# Patient Record
Sex: Male | Born: 1955 | Race: Black or African American | Hispanic: No | Marital: Single | Smoking: Former smoker
Health system: Southern US, Community
[De-identification: ages and names within clinical notes are randomized; demographics above are authoritative.]

## PROBLEM LIST (undated history)

## (undated) DIAGNOSIS — I999 Unspecified disorder of circulatory system: Secondary | ICD-10-CM

## (undated) DIAGNOSIS — E039 Hypothyroidism, unspecified: Secondary | ICD-10-CM

## (undated) DIAGNOSIS — M109 Gout, unspecified: Secondary | ICD-10-CM

## (undated) DIAGNOSIS — J449 Chronic obstructive pulmonary disease, unspecified: Secondary | ICD-10-CM

## (undated) DIAGNOSIS — F1010 Alcohol abuse, uncomplicated: Secondary | ICD-10-CM

## (undated) DIAGNOSIS — E785 Hyperlipidemia, unspecified: Secondary | ICD-10-CM

## (undated) DIAGNOSIS — I10 Essential (primary) hypertension: Secondary | ICD-10-CM

## (undated) DIAGNOSIS — I639 Cerebral infarction, unspecified: Secondary | ICD-10-CM

## (undated) HISTORY — DX: Alcohol abuse, uncomplicated: F10.10

## (undated) HISTORY — DX: Essential (primary) hypertension: I10

## (undated) HISTORY — DX: Hyperlipidemia, unspecified: E78.5

## (undated) HISTORY — DX: Hypothyroidism, unspecified: E03.9

## (undated) HISTORY — DX: Cerebral infarction, unspecified: I63.9

## (undated) HISTORY — DX: Unspecified disorder of circulatory system: I99.9

## (undated) HISTORY — DX: Chronic obstructive pulmonary disease, unspecified: J44.9

## (undated) HISTORY — DX: Gout, unspecified: M10.9

---

## 2008-07-10 ENCOUNTER — Ambulatory Visit: Payer: Self-pay | Admitting: Family Medicine

## 2008-11-22 ENCOUNTER — Emergency Department: Payer: Self-pay | Admitting: Emergency Medicine

## 2009-05-21 ENCOUNTER — Ambulatory Visit: Payer: Self-pay | Admitting: Neurology

## 2009-12-17 ENCOUNTER — Ambulatory Visit: Payer: Self-pay | Admitting: Gastroenterology

## 2009-12-18 LAB — PATHOLOGY REPORT

## 2015-02-04 ENCOUNTER — Other Ambulatory Visit: Payer: Self-pay | Admitting: Student

## 2015-02-04 DIAGNOSIS — R2232 Localized swelling, mass and lump, left upper limb: Secondary | ICD-10-CM

## 2015-02-11 ENCOUNTER — Ambulatory Visit: Admission: RE | Admit: 2015-02-11 | Payer: Medicaid Other | Source: Ambulatory Visit

## 2015-02-26 ENCOUNTER — Ambulatory Visit: Payer: Medicaid Other

## 2015-03-02 ENCOUNTER — Ambulatory Visit
Admission: RE | Admit: 2015-03-02 | Discharge: 2015-03-02 | Disposition: A | Discharge status: Home / Self Care | Payer: Medicare Other | Source: Ambulatory Visit | Attending: Student | Admitting: Student

## 2015-03-02 DIAGNOSIS — R2232 Localized swelling, mass and lump, left upper limb: Secondary | ICD-10-CM | POA: Diagnosis present

## 2015-03-02 MED ORDER — GADOBENATE DIMEGLUMINE 529 MG/ML IV SOLN
20.0000 mL | Freq: Once | INTRAVENOUS | Status: AC | PRN
  Administered 2015-03-02: 16 mL via INTRAVENOUS

## 2016-09-27 ENCOUNTER — Telehealth: Payer: Self-pay | Admitting: *Deleted

## 2016-09-27 DIAGNOSIS — Z87891 Personal history of nicotine dependence: Secondary | ICD-10-CM

## 2016-09-27 NOTE — Telephone Encounter (Signed)
Received referral for initial lung cancer screening scan. Contacted patient's sister and obtained smoking history,(current, 48 pack year) as well as answering questions related to screening process. Patient denies signs of lung cancer such as weight loss or hemoptysis. Patient denies comorbidity that would prevent curative treatment if lung cancer were found. Patient is scheduled for shared decision making visit and CT scan on 10/04/16.

## 2016-10-04 ENCOUNTER — Ambulatory Visit
Admission: RE | Admit: 2016-10-04 | Discharge: 2016-10-04 | Disposition: A | Discharge status: Home / Self Care | Payer: Medicare Other | Source: Ambulatory Visit | Attending: Oncology | Admitting: Oncology

## 2016-10-04 ENCOUNTER — Inpatient Hospital Stay: Payer: Medicare Other | Attending: Oncology | Admitting: Oncology

## 2016-10-04 ENCOUNTER — Encounter: Payer: Self-pay | Admitting: Oncology

## 2016-10-04 DIAGNOSIS — Z87891 Personal history of nicotine dependence: Secondary | ICD-10-CM

## 2016-10-04 DIAGNOSIS — Z720 Tobacco use: Secondary | ICD-10-CM | POA: Insufficient documentation

## 2016-10-04 DIAGNOSIS — F17210 Nicotine dependence, cigarettes, uncomplicated: Secondary | ICD-10-CM | POA: Diagnosis not present

## 2016-10-04 DIAGNOSIS — I2510 Atherosclerotic heart disease of native coronary artery without angina pectoris: Secondary | ICD-10-CM | POA: Insufficient documentation

## 2016-10-04 DIAGNOSIS — Z122 Encounter for screening for malignant neoplasm of respiratory organs: Secondary | ICD-10-CM | POA: Diagnosis not present

## 2016-10-04 DIAGNOSIS — I700 Atherosclerosis of aorta: Secondary | ICD-10-CM | POA: Insufficient documentation

## 2016-10-04 NOTE — Progress Notes (Signed)
In accordance with CMS guidelines, patient has met eligibility criteria including age, absence of signs or symptoms of lung cancer.  Social History  Substance Use Topics  . Smoking status: Current Every Day Smoker    Packs/day: 1.00    Years: 48.00  . Smokeless tobacco: Not on file  . Alcohol use Not on file     A shared decision-making session was conducted prior to the performance of CT scan. This includes one or more decision aids, includes benefits and harms of screening, follow-up diagnostic testing, over-diagnosis, false positive rate, and total radiation exposure.  Counseling on the importance of adherence to annual lung cancer LDCT screening, impact of co-morbidities, and ability or willingness to undergo diagnosis and treatment is imperative for compliance of the program.  Counseling on the importance of continued smoking cessation for former smokers; the importance of smoking cessation for current smokers, and information about tobacco cessation interventions have been given to patient including [place] and [address] quit [place] programs.  Written order for lung cancer screening with LDCT has been given to the patient and any and all questions have been answered to the best of my abilities.   Yearly follow up will be coordinated by Burgess Estelle, Thoracic Navigator.

## 2016-10-06 ENCOUNTER — Encounter: Payer: Self-pay | Admitting: *Deleted

## 2017-10-12 ENCOUNTER — Telehealth: Payer: Self-pay | Admitting: *Deleted

## 2017-10-12 DIAGNOSIS — Z122 Encounter for screening for malignant neoplasm of respiratory organs: Secondary | ICD-10-CM

## 2017-10-12 DIAGNOSIS — Z87891 Personal history of nicotine dependence: Secondary | ICD-10-CM

## 2017-10-12 NOTE — Telephone Encounter (Signed)
Notified patient sister that annual lung cancer screening low dose CT scan is due currently or will be in near future. Confirmed that patient is within the age range of 55-77, and asymptomatic, (no signs or symptoms of lung cancer). Patients sister denies illness that would prevent curative treatment for lung cancer if found. Verified smoking history, (current, 49 pack year). The shared decision making visit was done 10/04/16. Patient is agreeable for CT scan being scheduled.

## 2017-10-23 ENCOUNTER — Ambulatory Visit: Admission: RE | Admit: 2017-10-23 | Payer: Medicare Other | Source: Ambulatory Visit

## 2017-11-07 ENCOUNTER — Telehealth: Payer: Self-pay | Admitting: *Deleted

## 2017-11-07 NOTE — Telephone Encounter (Signed)
Left message for patient to notify them that it is time to schedule annual low dose lung cancer screening CT scan. Instructed patient to call back to verify information prior to the scan being scheduled.  

## 2017-11-08 ENCOUNTER — Telehealth: Payer: Self-pay | Admitting: *Deleted

## 2017-11-08 NOTE — Telephone Encounter (Signed)
Spoke with sister regarding rescheduling lung screening scan. Please see prior documentation regarding elibility requirements.

## 2017-11-14 ENCOUNTER — Ambulatory Visit
Admission: RE | Admit: 2017-11-14 | Discharge: 2017-11-14 | Disposition: A | Discharge status: Home / Self Care | Payer: Medicare Other | Source: Ambulatory Visit | Attending: Oncology | Admitting: Oncology

## 2017-11-14 DIAGNOSIS — J439 Emphysema, unspecified: Secondary | ICD-10-CM | POA: Diagnosis not present

## 2017-11-14 DIAGNOSIS — I2510 Atherosclerotic heart disease of native coronary artery without angina pectoris: Secondary | ICD-10-CM | POA: Insufficient documentation

## 2017-11-14 DIAGNOSIS — Z87891 Personal history of nicotine dependence: Secondary | ICD-10-CM | POA: Diagnosis not present

## 2017-11-14 DIAGNOSIS — R918 Other nonspecific abnormal finding of lung field: Secondary | ICD-10-CM | POA: Insufficient documentation

## 2017-11-14 DIAGNOSIS — I700 Atherosclerosis of aorta: Secondary | ICD-10-CM | POA: Insufficient documentation

## 2017-11-14 DIAGNOSIS — Z122 Encounter for screening for malignant neoplasm of respiratory organs: Secondary | ICD-10-CM | POA: Insufficient documentation

## 2017-11-20 ENCOUNTER — Telehealth: Payer: Self-pay | Admitting: *Deleted

## 2017-11-20 NOTE — Telephone Encounter (Signed)
Notified patient of LDCT lung cancer screening program results with recommendation for 6 month follow up imaging. Also notified of incidental findings noted below and is encouraged to discuss further with PCP who will receive a copy of this note and/or the CT report. Patient verbalizes understanding.   IMPRESSION: 1. Lung-RADS 3, probably benign findings. Short-term follow-up in 6 months is recommended with repeat low-dose chest CT without contrast (please use the following order, "CT CHEST LCS NODULE FOLLOW-UP W/O CM"). Biapical pleuroparenchymal scarring, with a right apical density which may have increased/enlarged compared to the 10/04/2016 exam. Favor progressive scarring. A developing neoplasm cannot be excluded. 2. Aortic atherosclerosis (ICD10-I70.0), coronary artery atherosclerosis and emphysema (ICD10-J43.9).

## 2018-04-28 ENCOUNTER — Telehealth: Payer: Self-pay

## 2018-04-28 NOTE — Telephone Encounter (Signed)
Call pt sister regarding lung screening. Left message for a call return.

## 2018-05-04 ENCOUNTER — Telehealth: Payer: Self-pay | Admitting: *Deleted

## 2018-05-04 NOTE — Telephone Encounter (Signed)
Voicemail left at sister's number as previously instructed in attempt to schedule LCS nodule follow up scan.

## 2018-05-08 ENCOUNTER — Telehealth: Payer: Self-pay | Admitting: *Deleted

## 2018-05-08 ENCOUNTER — Encounter: Payer: Self-pay | Admitting: *Deleted

## 2018-05-08 DIAGNOSIS — Z122 Encounter for screening for malignant neoplasm of respiratory organs: Secondary | ICD-10-CM

## 2018-05-08 NOTE — Telephone Encounter (Signed)
Patient has been notified that the annual lung cancer screening low dose CT scan is due currently or will be in the near future.  Confirmed that the patient is within the age range of 655-80, and asymptomatic, and currently exhibits no signs or symptoms of lung cancer.  Patient denies illness that would prevent curative treatment for lung cancer if found.  Verified smoking history, current smoker 1 ppd with 50 pky history.  The shared decision making visit was completed on 10-04-16.  Patient is agreeable for the CT scan to be scheduled.  Will call patient back with date and time of appointment.

## 2018-05-08 NOTE — Telephone Encounter (Signed)
Called sister Claris CheMargaret r/t LDCT screening appt for Tuesday 05/22/2018 @ 2:00pm here @ [facility], voiced understanding. appt mailed.

## 2018-05-22 ENCOUNTER — Ambulatory Visit: Payer: Medicare Other

## 2018-06-01 ENCOUNTER — Ambulatory Visit
Admission: RE | Admit: 2018-06-01 | Discharge: 2018-06-01 | Disposition: A | Discharge status: Home / Self Care | Payer: Medicare Other | Source: Ambulatory Visit | Attending: Nurse Practitioner | Admitting: Nurse Practitioner

## 2018-06-01 DIAGNOSIS — Z122 Encounter for screening for malignant neoplasm of respiratory organs: Secondary | ICD-10-CM | POA: Diagnosis not present

## 2018-06-04 ENCOUNTER — Encounter: Payer: Self-pay | Admitting: *Deleted

## 2018-06-18 DIAGNOSIS — Z79899 Other long term (current) drug therapy: Secondary | ICD-10-CM | POA: Insufficient documentation

## 2018-06-18 DIAGNOSIS — M79671 Pain in right foot: Secondary | ICD-10-CM | POA: Insufficient documentation

## 2018-06-18 DIAGNOSIS — G8929 Other chronic pain: Secondary | ICD-10-CM | POA: Insufficient documentation

## 2018-06-18 DIAGNOSIS — Z8739 Personal history of other diseases of the musculoskeletal system and connective tissue: Secondary | ICD-10-CM | POA: Insufficient documentation

## 2018-07-09 DIAGNOSIS — M19071 Primary osteoarthritis, right ankle and foot: Secondary | ICD-10-CM | POA: Insufficient documentation

## 2019-06-05 ENCOUNTER — Telehealth: Payer: Self-pay

## 2019-06-05 NOTE — Telephone Encounter (Signed)
Left message for patient to notify them that it is time to schedule annual low dose lung cancer screening CT scan. Instructed patient to call back ([phone]) to verify information prior to the scan being scheduled.  

## 2019-06-17 ENCOUNTER — Telehealth: Payer: Self-pay | Admitting: *Deleted

## 2019-06-17 DIAGNOSIS — Z87891 Personal history of nicotine dependence: Secondary | ICD-10-CM

## 2019-06-17 NOTE — Telephone Encounter (Signed)
Spoke with sister, Corey Olson and verified info, Patient has been notified that annual lung cancer screening low dose CT scan is due currently or will be in near future. Confirmed that patient is within the age range of 55-77, and asymptomatic, (no signs or symptoms of lung cancer). Patient denies illness that would prevent curative treatment for lung cancer if found. Verified smoking history, (current, 51 pack year). The shared decision making visit was done 10/04/16. Patient is agreeable for CT scan being scheduled.

## 2019-06-20 ENCOUNTER — Other Ambulatory Visit: Payer: Self-pay

## 2019-06-20 ENCOUNTER — Ambulatory Visit
Admission: RE | Admit: 2019-06-20 | Discharge: 2019-06-20 | Disposition: A | Discharge status: Home / Self Care | Payer: Medicare Other | Source: Ambulatory Visit | Attending: Nurse Practitioner | Admitting: Nurse Practitioner

## 2019-06-20 DIAGNOSIS — Z87891 Personal history of nicotine dependence: Secondary | ICD-10-CM | POA: Diagnosis present

## 2019-06-24 ENCOUNTER — Encounter: Payer: Self-pay | Admitting: *Deleted

## 2020-01-13 ENCOUNTER — Telehealth (INDEPENDENT_AMBULATORY_CARE_PROVIDER_SITE_OTHER): Payer: Self-pay | Admitting: Gastroenterology

## 2020-01-13 ENCOUNTER — Other Ambulatory Visit: Payer: Self-pay

## 2020-01-13 DIAGNOSIS — Z1211 Encounter for screening for malignant neoplasm of colon: Secondary | ICD-10-CM

## 2020-01-13 NOTE — Progress Notes (Signed)
Gastroenterology Pre-Procedure Review  Request Date: Monday 03/09/20 Requesting Physician: Dr. Allegra Lai  PATIENT REVIEW QUESTIONS: The patient responded to the following health history questions as indicated:    1. Are you having any GI issues? no 2. Do you have a personal history of Polyps? yes ( maybe 1 in the past but did not see a colonoscopy report in chart) 3. Do you have a family history of Colon Cancer or Polyps? no 4. Diabetes Mellitus? no 5. Joint replacements in the past 12 months?no 6. Major health problems in the past 3 months?no 7. Any artificial heart valves, MVP, or defibrillator?no    MEDICATIONS & ALLERGIES:    Patient reports the following regarding taking any anticoagulation/antiplatelet therapy:   Plavix, Coumadin, Eliquis, Xarelto, Lovenox, Pradaxa, Brilinta, or Effient? no Aspirin? yes (81 mg daily)  Patient confirms/reports the following medications:  Current Outpatient Medications  Medication Sig Dispense Refill  . albuterol (VENTOLIN HFA) 108 (90 Base) MCG/ACT inhaler Inhale into the lungs.    Marland Kitchen allopurinol (ZYLOPRIM) 300 MG tablet Take by mouth.    Marland Kitchen amLODipine (NORVASC) 10 MG tablet Take 1 tablet by mouth daily.    Marland Kitchen aspirin 81 MG EC tablet Take by mouth.    Marland Kitchen atorvastatin (LIPITOR) 80 MG tablet Take by mouth.    . budesonide-formoterol (SYMBICORT) 160-4.5 MCG/ACT inhaler 1 inhalation once daily.    . colchicine 0.6 MG tablet Take by mouth.    . gabapentin (NEURONTIN) 100 MG capsule Take by mouth.    . metoprolol tartrate (LOPRESSOR) 50 MG tablet Take 1 tablet by mouth daily.     No current facility-administered medications for this visit.    Patient confirms/reports the following allergies:  No Known Allergies  Orders Placed This Encounter  Procedures  . Procedural/ Surgical Case Request: COLONOSCOPY WITH PROPOFOL    Standing Status:   Standing    Number of Occurrences:   1    Order Specific Question:   Pre-op diagnosis    Answer:   screening  colonoscopy    Order Specific Question:   CPT Code    Answer:   53299    AUTHORIZATION INFORMATION Primary Insurance: 1D#: Group #:  Secondary Insurance: 1D#: Group #:  SCHEDULE INFORMATION: Date: 03/09/20 Time: Location:[facility]

## 2020-03-05 ENCOUNTER — Other Ambulatory Visit: Admission: RE | Admit: 2020-03-05 | Payer: Medicare Other | Source: Ambulatory Visit

## 2020-03-09 ENCOUNTER — Telehealth: Payer: Self-pay | Admitting: Gastroenterology

## 2020-03-09 ENCOUNTER — Encounter: Admission: RE | Payer: Self-pay | Source: Home / Self Care

## 2020-03-09 ENCOUNTER — Ambulatory Visit: Admission: RE | Admit: 2020-03-09 | Payer: Medicare Other | Source: Home / Self Care | Admitting: Gastroenterology

## 2020-03-09 ENCOUNTER — Telehealth: Payer: Self-pay

## 2020-03-09 SURGERY — COLONOSCOPY WITH PROPOFOL
Anesthesia: General

## 2020-03-09 NOTE — Telephone Encounter (Signed)
Patient sister Corey Olson calling to resch pt colonoscopy that was sch for today. She was not able to get pt covid test, so she would like to resch everything. Please call pt sister back since pt had a stroke and can not talk. Pt had pre screening call on 8.23.21.

## 2020-03-09 NOTE — Telephone Encounter (Signed)
Called patients sister to reschedule Mr. Romanello procedure. Left voicemail for her to call the office back.

## 2020-06-22 ENCOUNTER — Telehealth: Payer: Self-pay | Admitting: *Deleted

## 2020-06-22 ENCOUNTER — Ambulatory Visit (INDEPENDENT_AMBULATORY_CARE_PROVIDER_SITE_OTHER): Payer: Medicare Other | Admitting: Cardiology

## 2020-06-22 ENCOUNTER — Encounter: Payer: Self-pay | Admitting: Cardiology

## 2020-06-22 ENCOUNTER — Other Ambulatory Visit: Payer: Self-pay

## 2020-06-22 VITALS — BP 124/78 | HR 57 | Ht 67.0 in | Wt 163.2 lb

## 2020-06-22 DIAGNOSIS — E7800 Pure hypercholesterolemia, unspecified: Secondary | ICD-10-CM

## 2020-06-22 DIAGNOSIS — I2510 Atherosclerotic heart disease of native coronary artery without angina pectoris: Secondary | ICD-10-CM

## 2020-06-22 DIAGNOSIS — R0600 Dyspnea, unspecified: Secondary | ICD-10-CM

## 2020-06-22 DIAGNOSIS — R0609 Other forms of dyspnea: Secondary | ICD-10-CM

## 2020-06-22 DIAGNOSIS — F17200 Nicotine dependence, unspecified, uncomplicated: Secondary | ICD-10-CM

## 2020-06-22 DIAGNOSIS — I728 Aneurysm of other specified arteries: Secondary | ICD-10-CM

## 2020-06-22 NOTE — Telephone Encounter (Signed)
Attempted to contact and schedule lung screening scan. Message left for patient to call back to schedule. 

## 2020-06-22 NOTE — Progress Notes (Signed)
Cardiology Office Note:    Date:  06/22/2020   ID:  Corey Olson, DOB Sep 26, 1955, MRN [id number]  PCP:  [name], MD  [facility] Cardiologist:  Debbe Odea, MD  [facility] Electrophysiologist:  None   Referring MD: [name], MD   Chief Complaint  Patient presents with  . Other    CAD/AAA. Meds reviewed verbally with pt.    History of Present Illness:    Corey Olson is a 65 y.o. 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  Patient had a recent chest CT for lung cancer screening.  Aortic atherosclerosis and calcified plaque in the coronary arteries involving the left main, LAD, left circumflex and RCA was noted.  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  Patient denies chest pain, endorses shortness of breath especially with exertion which he attributes to COPD.  He walks with a walker due to right-sided weakness from prior stroke.  He still smokes.  Denies palpitations, edema.    Past Medical History:  Diagnosis Date  . Alcohol use disorder, mild, abuse   . COPD (chronic obstructive pulmonary disease) (HCC)   . Gout   . Hyperlipidemia   . Hypertension   . Hypothyroid   . Peripheral vascular complication   . Stroke [facility])     History reviewed. No pertinent surgical history.  Current Medications: Current Meds  Medication Sig  . albuterol (VENTOLIN HFA) 108 (90 Base) MCG/ACT inhaler Inhale into the lungs.  Marland Kitchen allopurinol (ZYLOPRIM) 300 MG tablet Take 300 mg by mouth daily.  Marland Kitchen amLODipine (NORVASC) 10 MG tablet Take 1 tablet by mouth daily.  Marland Kitchen aspirin 81 MG EC tablet Take by mouth.  Marland Kitchen atorvastatin (LIPITOR) 80 MG tablet Take 80 mg by mouth daily.  . budesonide-formoterol (SYMBICORT) 160-4.5 MCG/ACT inhaler 1 inhalation once daily.  . colchicine 0.6 MG tablet Take 0.6 mg by mouth daily.  Marland Kitchen gabapentin (NEURONTIN) 100 MG capsule Take 100 mg by  mouth at bedtime.  . metoprolol tartrate (LOPRESSOR) 50 MG tablet Take 1 tablet by mouth daily.     Allergies:   Patient has no known allergies.   Social History   Socioeconomic History  . Marital status: Single    Spouse name: Not on file  . Number of children: Not on file  . Years of education: Not on file  . Highest education level: Not on file  Occupational History  . Not on file  Tobacco Use  . Smoking status: Current Every Day Smoker    Packs/day: 1.00    Years: 50.00    Pack years: 50.00  . Smokeless tobacco: Never Used  Substance and Sexual Activity  . Alcohol use: Yes    Comment: social  . Drug use: Never  . Sexual activity: Not on file  Other Topics Concern  . Not on file  Social History Narrative  . Not on file   Social Determinants of Health   Financial Resource Strain: Not on file  Food Insecurity: Not on file  Transportation Needs: Not on file  Physical Activity: Not on file  Stress: Not on file  Social Connections: Not on file     Family History: The patient's family history includes Heart Problems in his father; Heart attack in his father and mother.  ROS:   Please see the history of present illness.     All other systems reviewed and  are negative.  EKGs/Labs/Other Studies Reviewed:    The following studies were reviewed today:   EKG:  EKG is  ordered today.  The ekg ordered today demonstrates sinus bradycardia, first-degree AV block  Recent Labs: No results found for requested labs within last 8760 hours.  Recent Lipid Panel No results found for: CHOL, TRIG, HDL, CHOLHDL, VLDL, LDLCALC, LDLDIRECT   Risk Assessment/Calculations:      Physical Exam:    VS:  BP 124/78 (BP Location: Left Arm, Patient Position: Sitting, Cuff Size: Normal)   Pulse (!) 57   Ht 5\' 7"  (1.702 m)   Wt 163 lb 4 oz (74 kg)   SpO2 98%   BMI 25.57 kg/m     Wt Readings from Last 3 Encounters:  06/22/20 163 lb 4 oz (74 kg)  06/20/19 165 lb (74.8 kg)   11/14/17 165 lb (74.8 kg)     GEN:  Well nourished, well developed in no acute distress HEENT: Normal NECK: No JVD; No carotid bruits LYMPHATICS: No lymphadenopathy CARDIAC: Distant heart sounds, no murmurs RESPIRATORY: Decreased breath sounds, rhonchi bilaterally, wheezing noted bilaterally ABDOMEN: Soft, non-tender, non-distended MUSCULOSKELETAL:  No edema; right arm and leg weakness SKIN: Warm and dry NEUROLOGIC:  Alert and oriented x 3, right-sided weakness PSYCHIATRIC:  Normal affect   ASSESSMENT:    1. Coronary artery disease involving native coronary artery of native heart without angina pectoris   2. Dyspnea on exertion   3. Smoking   4. Pure hypercholesterolemia   5. Celiac artery aneurysm (HCC)    PLAN:    In order of problems listed above:  1. Three-vessel calcification noted on recent chest CT.  Continue aspirin, Lipitor.  Endorses shortness of breath.  Obtain Lexiscan Myoview to evaluate presence of ischemia.  Get echocardiogram to evaluate systolic and diastolic function. 2. Shortness of breath with exertion.  Could be due to COPD, current smoker.  Get echo and Lexiscan Myoview as above. 3. Current smoker, cessation advised. 4. Hyperlipidemia, high intensity statin, Lipitor 80 mg daily.  Tl obtained from PCP at goal of 62. 5. 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  Refer to vascular surgery for monitoring and management.  Follow-up after echo and Myoview.   Shared Decision Making/Informed Consent The risks [chest pain, shortness of breath, cardiac arrhythmias, dizziness, blood pressure fluctuations, myocardial infarction, stroke/transient ischemic attack, nausea, vomiting, allergic reaction, radiation exposure, metallic taste sensation and life-threatening complications (estimated to be 1 in 10,000)], benefits (risk stratification, diagnosing coronary artery disease, treatment guidance) and alternatives of a nuclear stress test were discussed in detail with Mr.  Olson and he agrees to proceed.   Medication Adjustments/Labs and Tests Ordered: Current medicines are reviewed at length with the patient today.  Concerns regarding medicines are outlined above.  Orders Placed This Encounter  Procedures  . NM Myocar Multi W/Spect W/Wall Motion / EF  . Ambulatory referral to Vascular Surgery  . EKG 12-Lead  . ECHOCARDIOGRAM COMPLETE   No orders of the defined types were placed in this encounter.   Patient Instructions  Medication Instructions:   Your physician recommends that you continue on your current medications as directed. Please refer to the Current Medication list given to you today.  *If you need a refill on your cardiac medications before your next appointment, please call your pharmacy*   Lab Work: None Ordered If you have labs (blood work) drawn today and your tests are completely normal, you will receive your results only by: Toni Arthurs MyChart Message (  if you have MyChart) OR . A paper copy in the mail If you have any lab test that is abnormal or we need to change your treatment, we will call you to review the results.   Testing/Procedures:  1.  Your physician has requested that you have an echocardiogram. Echocardiography is a painless test that uses sound waves to create images of your heart. It provides your doctor with information about the size and shape of your heart and how well your heart's chambers and valves are working. This procedure takes approximately one hour. There are no restrictions for this procedure.  2.  [facility] MYOVIEW   Your caregiver has ordered a Stress Test with nuclear imaging. The purpose of this test is to evaluate the blood supply to your heart muscle. This procedure is referred to as a "Non-Invasive Stress Test." This is because other than having an IV started in your vein, nothing is inserted or "invades" your body. Cardiac stress tests are done to find areas of poor blood flow to the heart by determining the  extent of coronary artery disease (CAD). Some patients exercise on a treadmill, which naturally increases the blood flow to your heart, while others who are  unable to walk on a treadmill due to physical limitations have a pharmacologic/chemical stress agent called Lexiscan . This medicine will mimic walking on a treadmill by temporarily increasing your coronary blood flow.      PLEASE REPORT TO [facility] MEDICAL MALL ENTRANCE   THE VOLUNTEERS AT THE FIRST DESK WILL DIRECT YOU WHERE TO GO     *Please note: these test may take anywhere between 2-4 hours to complete       Date of Procedure:_____________________________________   Arrival Time for Procedure:______________________________    PLEASE NOTIFY THE OFFICE AT LEAST 24 HOURS IN ADVANCE IF YOU ARE UNABLE TO KEEP YOUR APPOINTMENT.  [phone]  PLEASE NOTIFY NUCLEAR MEDICINE AT [facility] AT LEAST 24 HOURS IN ADVANCE IF YOU ARE UNABLE TO KEEP YOUR APPOINTMENT. [phone]         How to prepare for your Myoview test:    _XX___:  Hold betablocker(s) night before procedure and morning of procedure:   metoprolol tartrate (LOPRESSOR    1. Do not eat or drink after midnight  2. No caffeine for 24 hours prior to test  3. No smoking 24 hours prior to test.  4. Unless instructed otherwise, Take your medication with a small sips of water.    5.         Ladies, please do not wear dresses. Skirts or pants are appropriate. Please wear a short sleeve shirt.  6. No perfume, cologne or lotion.  7. Wear comfortable walking shoes. No heels!     Follow-Up: At [facility], you and your health needs are our priority.  As part of our continuing mission to provide you with exceptional heart care, we have created designated Provider Care Teams.  These Care Teams include your primary Cardiologist (physician) and Advanced Practice Providers (APPs -  Physician Assistants and Nurse Practitioners) who all work together to provide you with the care you need,  when you need it.  We recommend signing up for the patient portal called "MyChart".  Sign up information is provided on this After Visit Summary.  MyChart is used to connect with patients for Virtual Visits (Telemedicine).  Patients are able to view lab/test results, encounter notes, upcoming appointments, etc.  Non-urgent messages can be sent to your provider as well.  To learn more about what you can do with MyChart, go to [email or web address].    Your next appointment:   Follow up after Myoview and Echo   The format for your next appointment:   In Person  Provider:   Debbe Odea, MD   Other Instructions      Signed, Debbe Odea, MD  06/22/2020 4:40 PM    [facility]

## 2020-06-22 NOTE — Patient Instructions (Signed)
Medication Instructions:   Your physician recommends that you continue on your current medications as directed. Please refer to the Current Medication list given to you today.  *If you need a refill on your cardiac medications before your next appointment, please call your pharmacy*   Lab Work: None Ordered If you have labs (blood work) drawn today and your tests are completely normal, you will receive your results only by: Marland Kitchen MyChart Message (if you have MyChart) OR . A paper copy in the mail If you have any lab test that is abnormal or we need to change your treatment, we will call you to review the results.   Testing/Procedures:  1.  Your physician has requested that you have an echocardiogram. Echocardiography is a painless test that uses sound waves to create images of your heart. It provides your doctor with information about the size and shape of your heart and how well your heart's chambers and valves are working. This procedure takes approximately one hour. There are no restrictions for this procedure.  2.  [facility] MYOVIEW   Your caregiver has ordered a Stress Test with nuclear imaging. The purpose of this test is to evaluate the blood supply to your heart muscle. This procedure is referred to as a "Non-Invasive Stress Test." This is because other than having an IV started in your vein, nothing is inserted or "invades" your body. Cardiac stress tests are done to find areas of poor blood flow to the heart by determining the extent of coronary artery disease (CAD). Some patients exercise on a treadmill, which naturally increases the blood flow to your heart, while others who are  unable to walk on a treadmill due to physical limitations have a pharmacologic/chemical stress agent called Lexiscan . This medicine will mimic walking on a treadmill by temporarily increasing your coronary blood flow.      PLEASE REPORT TO [facility] MEDICAL MALL ENTRANCE   THE VOLUNTEERS AT THE FIRST DESK WILL DIRECT  YOU WHERE TO GO     *Please note: these test may take anywhere between 2-4 hours to complete       Date of Procedure:_____________________________________   Arrival Time for Procedure:______________________________    PLEASE NOTIFY THE OFFICE AT LEAST 24 HOURS IN ADVANCE IF YOU ARE UNABLE TO KEEP YOUR APPOINTMENT.  [phone]  PLEASE NOTIFY NUCLEAR MEDICINE AT [facility] AT LEAST 24 HOURS IN ADVANCE IF YOU ARE UNABLE TO KEEP YOUR APPOINTMENT. [phone]         How to prepare for your Myoview test:    _XX___:  Hold betablocker(s) night before procedure and morning of procedure:   metoprolol tartrate (LOPRESSOR    1. Do not eat or drink after midnight  2. No caffeine for 24 hours prior to test  3. No smoking 24 hours prior to test.  4. Unless instructed otherwise, Take your medication with a small sips of water.    5.         Ladies, please do not wear dresses. Skirts or pants are appropriate. Please wear a short sleeve shirt.  6. No perfume, cologne or lotion.  7. Wear comfortable walking shoes. No heels!     Follow-Up: At [facility], you and your health needs are our priority.  As part of our continuing mission to provide you with exceptional heart care, we have created designated Provider Care Teams.  These Care Teams include your primary Cardiologist (physician) and Advanced Practice Providers (APPs -  Physician Assistants and Nurse Practitioners) who  all work together to provide you with the care you need, when you need it.  We recommend signing up for the patient portal called "MyChart".  Sign up information is provided on this After Visit Summary.  MyChart is used to connect with patients for Virtual Visits (Telemedicine).  Patients are able to view lab/test results, encounter notes, upcoming appointments, etc.  Non-urgent messages can be sent to your provider as well.   To learn more about what you can do with MyChart, go to [email or web address].    Your next  appointment:   Follow up after Myoview and Echo   The format for your next appointment:   In Person  Provider:   Debbe Odea, MD   Other Instructions

## 2020-06-29 ENCOUNTER — Other Ambulatory Visit: Payer: Self-pay

## 2020-06-29 ENCOUNTER — Ambulatory Visit
Admission: RE | Admit: 2020-06-29 | Discharge: 2020-06-29 | Disposition: A | Discharge status: Home / Self Care | Payer: Medicare Other | Source: Ambulatory Visit | Attending: Cardiology | Admitting: Cardiology

## 2020-06-29 DIAGNOSIS — I2510 Atherosclerotic heart disease of native coronary artery without angina pectoris: Secondary | ICD-10-CM | POA: Insufficient documentation

## 2020-06-29 DIAGNOSIS — R0600 Dyspnea, unspecified: Secondary | ICD-10-CM | POA: Insufficient documentation

## 2020-06-29 DIAGNOSIS — R0609 Other forms of dyspnea: Secondary | ICD-10-CM

## 2020-06-29 LAB — NM MYOCAR MULTI W/SPECT W/WALL MOTION / EF
Estimated workload: 1 METS
Exercise duration (min): 0 min
Exercise duration (sec): 0 s
LV dias vol: 102 mL (ref 62–150)
LV sys vol: 49 mL
MPHR: 156 {beats}/min
Peak HR: 80 {beats}/min
Percent HR: 51 %
Rest HR: 50 {beats}/min
SDS: 3
SRS: 6
SSS: 4
TID: 1

## 2020-06-29 MED ORDER — TECHNETIUM TC 99M TETROFOSMIN IV KIT
10.5500 | PACK | Freq: Once | INTRAVENOUS | Status: AC | PRN
  Administered 2020-06-29: 10.55 via INTRAVENOUS

## 2020-06-29 MED ORDER — TECHNETIUM TC 99M TETROFOSMIN IV KIT
29.8100 | PACK | Freq: Once | INTRAVENOUS | Status: AC | PRN
  Administered 2020-06-29: 29.81 via INTRAVENOUS

## 2020-06-29 MED ORDER — REGADENOSON 0.4 MG/5ML IV SOLN
0.4000 mg | Freq: Once | INTRAVENOUS | Status: AC
  Administered 2020-06-29: 0.4 mg via INTRAVENOUS

## 2020-07-02 ENCOUNTER — Telehealth: Payer: Self-pay

## 2020-07-02 NOTE — Telephone Encounter (Signed)
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 Get echocardiogram as scheduled. Keep follow-up appointment. Thank you   Encouraged patient to call back if he has any questions or concerns.

## 2020-07-02 NOTE — Telephone Encounter (Signed)
Attempted to contact patient for scheduling lung screening scan. Left message for patient to call [phone] to schedule CT scan.  

## 2020-07-06 ENCOUNTER — Other Ambulatory Visit: Payer: Self-pay

## 2020-07-06 ENCOUNTER — Encounter (INDEPENDENT_AMBULATORY_CARE_PROVIDER_SITE_OTHER): Payer: Self-pay | Admitting: Vascular Surgery

## 2020-07-06 ENCOUNTER — Ambulatory Visit (INDEPENDENT_AMBULATORY_CARE_PROVIDER_SITE_OTHER): Payer: Medicare Other | Admitting: Vascular Surgery

## 2020-07-06 DIAGNOSIS — I739 Peripheral vascular disease, unspecified: Secondary | ICD-10-CM | POA: Diagnosis not present

## 2020-07-06 DIAGNOSIS — I2510 Atherosclerotic heart disease of native coronary artery without angina pectoris: Secondary | ICD-10-CM | POA: Diagnosis not present

## 2020-07-06 DIAGNOSIS — I728 Aneurysm of other specified arteries: Secondary | ICD-10-CM | POA: Diagnosis not present

## 2020-07-08 ENCOUNTER — Telehealth: Payer: Self-pay | Admitting: *Deleted

## 2020-07-08 NOTE — Telephone Encounter (Signed)
Attempted to contact patient for scheduling lung screening scan. Left message for patient to call [phone] to schedule CT scan.  

## 2020-07-09 ENCOUNTER — Ambulatory Visit (INDEPENDENT_AMBULATORY_CARE_PROVIDER_SITE_OTHER): Payer: Medicare Other

## 2020-07-09 ENCOUNTER — Other Ambulatory Visit: Payer: Self-pay

## 2020-07-09 DIAGNOSIS — R0609 Other forms of dyspnea: Secondary | ICD-10-CM

## 2020-07-09 DIAGNOSIS — I2510 Atherosclerotic heart disease of native coronary artery without angina pectoris: Secondary | ICD-10-CM

## 2020-07-09 DIAGNOSIS — R0600 Dyspnea, unspecified: Secondary | ICD-10-CM

## 2020-07-09 LAB — ECHOCARDIOGRAM COMPLETE
AR max vel: 2.41 cm2
AV Area VTI: 2.74 cm2
AV Area mean vel: 2.49 cm2
AV Mean grad: 4 mmHg
AV Peak grad: 8.2 mmHg
Ao pk vel: 1.43 m/s
Area-P 1/2: 3.85 cm2
Calc EF: 45.6 %
S' Lateral: 3.8 cm
Single Plane A2C EF: 40.7 %
Single Plane A4C EF: 44 %

## 2020-07-13 ENCOUNTER — Encounter: Payer: Self-pay | Admitting: Cardiology

## 2020-07-13 ENCOUNTER — Ambulatory Visit (INDEPENDENT_AMBULATORY_CARE_PROVIDER_SITE_OTHER): Payer: Medicare Other | Admitting: Cardiology

## 2020-07-13 ENCOUNTER — Other Ambulatory Visit: Payer: Self-pay

## 2020-07-13 VITALS — BP 110/86 | HR 62 | Ht 67.0 in | Wt 163.0 lb

## 2020-07-13 DIAGNOSIS — I2510 Atherosclerotic heart disease of native coronary artery without angina pectoris: Secondary | ICD-10-CM | POA: Diagnosis not present

## 2020-07-13 DIAGNOSIS — F17200 Nicotine dependence, unspecified, uncomplicated: Secondary | ICD-10-CM | POA: Diagnosis not present

## 2020-07-13 DIAGNOSIS — E7800 Pure hypercholesterolemia, unspecified: Secondary | ICD-10-CM

## 2020-07-13 NOTE — Patient Instructions (Signed)
Medication Instructions:  Your physician recommends that you continue on your current medications as directed. Please refer to the Current Medication list given to you today.  *If you need a refill on your cardiac medications before your next appointment, please call your pharmacy*  Follow-Up: At [facility], you and your health needs are our priority.  As part of our continuing mission to provide you with exceptional heart care, we have created designated Provider Care Teams.  These Care Teams include your primary Cardiologist (physician) and Advanced Practice Providers (APPs -  Physician Assistants and Nurse Practitioners) who all work together to provide you with the care you need, when you need it.  We recommend signing up for the patient portal called "MyChart".  Sign up information is provided on this After Visit Summary.  MyChart is used to connect with patients for Virtual Visits (Telemedicine).  Patients are able to view lab/test results, encounter notes, upcoming appointments, etc.  Non-urgent messages can be sent to your provider as well.   To learn more about what you can do with MyChart, go to [email or web address].    Your next appointment:   6 month(s)  The format for your next appointment:   In Person  Provider:   You may see Brian Agbor-Etang, MD or one of the following Advanced Practice Providers on your designated Care Team:    Christopher Berge, NP  Ryan Dunn, PA-C  Jacquelyn Visser, PA-C  Cadence Furth, PA-C  Caitlin Walker, NP  

## 2020-07-13 NOTE — Progress Notes (Signed)
Cardiology Office Note:    Date:  07/13/2020   ID:  Corey Olson, DOB 1955-06-10, MRN [id number]  PCP:  [name], MD  [facility] Cardiologist:  Debbe Odea, MD  [facility] Electrophysiologist:  None   Referring MD: [name], MD   Chief Complaint  Patient presents with  . Follow-up    Follow up to review test results. Medications verbally reviewed with patient and compared with list from pharmacy    History of Present Illness:    Corey Olson is a 65 y.o. 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  Previously seen due to coronary artery calcifications from lung cancer chest CT.  Endorses dyspnea on exertion.  Echo and Lexiscan Myoview was ordered to evaluate presence of ischemia in light of patient having shortness of breath.  Patient states doing okay, he still smokes, denies chest pain but endorses shortness of breath.    Past Medical History:  Diagnosis Date  . Alcohol use disorder, mild, abuse   . COPD (chronic obstructive pulmonary disease) (HCC)   . Gout   . Hyperlipidemia   . Hypertension   . Hypothyroid   . Peripheral vascular complication   . Stroke [facility])     History reviewed. No pertinent surgical history.  Current Medications: Current Meds  Medication Sig  . albuterol (VENTOLIN HFA) 108 (90 Base) MCG/ACT inhaler Inhale into the lungs.  Marland Kitchen allopurinol (ZYLOPRIM) 300 MG tablet Take 300 mg by mouth daily.  Marland Kitchen amLODipine (NORVASC) 10 MG tablet Take 1 tablet by mouth daily.  Marland Kitchen aspirin 81 MG EC tablet Take by mouth.  Marland Kitchen atorvastatin (LIPITOR) 80 MG tablet Take 80 mg by mouth daily.  . budesonide-formoterol (SYMBICORT) 160-4.5 MCG/ACT inhaler 1 inhalation once daily.  Marland Kitchen gabapentin (NEURONTIN) 100 MG capsule Take 100 mg by mouth at bedtime.  Marland Kitchen lisinopril (ZESTRIL) 20 MG tablet Take 20 mg by mouth daily.  . metoprolol tartrate  (LOPRESSOR) 50 MG tablet Take 1 tablet by mouth daily.     Allergies:   Patient has no known allergies.   Social History   Socioeconomic History  . Marital status: Single    Spouse name: Not on file  . Number of children: Not on file  . Years of education: Not on file  . Highest education level: Not on file  Occupational History  . Not on file  Tobacco Use  . Smoking status: Current Every Day Smoker    Packs/day: 1.00    Years: 50.00    Pack years: 50.00  . Smokeless tobacco: Never Used  Substance and Sexual Activity  . Alcohol use: Yes    Comment: social  . Drug use: Never  . Sexual activity: Not on file  Other Topics Concern  . Not on file  Social History Narrative  . Not on file   Social Determinants of Health   Financial Resource Strain: Not on file  Food Insecurity: Not on file  Transportation Needs: Not on file  Physical Activity: Not on file  Stress: Not on file  Social Connections: Not on file     Family History: The patient's family history includes Heart Problems in his father; Heart attack in his father and mother.  ROS:   Please see the history of present illness.     All other systems reviewed and are negative.  EKGs/Labs/Other Studies Reviewed:    The following studies were  reviewed today:   EKG:  EKG not  ordered today.    Recent Labs: No results found for requested labs within last 8760 hours.  Recent Lipid Panel No results found for: CHOL, TRIG, HDL, CHOLHDL, VLDL, LDLCALC, LDLDIRECT   Risk Assessment/Calculations:      Physical Exam:    VS:  BP 110/86 (BP Location: Left Arm, Patient Position: Sitting, Cuff Size: Normal)   Pulse 62   Ht 5\' 7"  (1.702 m)   Wt 163 lb (73.9 kg)   SpO2 97%   BMI 25.53 kg/m     Wt Readings from Last 3 Encounters:  07/13/20 163 lb (73.9 kg)  07/06/20 165 lb 6.4 oz (75 kg)  06/22/20 163 lb 4 oz (74 kg)     GEN:  Well nourished, well developed in no acute distress HEENT: Normal NECK: No JVD;  No carotid bruits LYMPHATICS: No lymphadenopathy CARDIAC: Distant heart sounds, no murmurs RESPIRATORY: Decreased breath sounds, rhonchi bilaterally, wheezing noted bilaterally ABDOMEN: Soft, non-tender, non-distended MUSCULOSKELETAL:  No edema; right arm and leg weakness SKIN: Warm and dry NEUROLOGIC:  Alert and oriented x 3, right-sided weakness PSYCHIATRIC:  Normal affect   ASSESSMENT:    1. Coronary artery disease involving native coronary artery of native heart without angina pectoris   2. Smoking   3. Pure hypercholesterolemia    PLAN:    In order of problems listed above:  1. Three-vessel calcification noted on recent chest CT.  Continue aspirin, Lipitor.  Echocardiogram showed preserved EF about 50%, Lexiscan Myoview with no evidence of ischemia. 2. Current smoker, cessation advised. 3. Hyperlipidemia, high intensity statin, Lipitor 80 mg daily.  Last lipid panel showed LDL at goal.  Follow-up in 6 months   Medication Adjustments/Labs and Tests Ordered: Current medicines are reviewed at length with the patient today.  Concerns regarding medicines are outlined above.  No orders of the defined types were placed in this encounter.  No orders of the defined types were placed in this encounter.   Patient Instructions  Medication Instructions:  Your physician recommends that you continue on your current medications as directed. Please refer to the Current Medication list given to you today.  *If you need a refill on your cardiac medications before your next appointment, please call your pharmacy*  Follow-Up: At [facility], you and your health needs are our priority.  As part of our continuing mission to provide you with exceptional heart care, we have created designated Provider Care Teams.  These Care Teams include your primary Cardiologist (physician) and Advanced Practice Providers (APPs -  Physician Assistants and Nurse Practitioners) who all work together to  provide you with the care you need, when you need it.  We recommend signing up for the patient portal called "MyChart".  Sign up information is provided on this After Visit Summary.  MyChart is used to connect with patients for Virtual Visits (Telemedicine).  Patients are able to view lab/test results, encounter notes, upcoming appointments, etc.  Non-urgent messages can be sent to your provider as well.   To learn more about what you can do with MyChart, go to [facility].    Your next appointment:   6 month(s)  The format for your next appointment:   In Person  Provider:   You may see [email or web address], MD or one of the following Advanced Practice Providers on your designated Care Team:    Debbe Odea, NP  Nicolasa Ducking, PA-C  Eula Listen, PA-C  Cadence [place], [place]  Gillian Shields, NP     Signed, Debbe Odea, MD  07/13/2020 4:56 PM    [facility]

## 2020-07-17 ENCOUNTER — Encounter (INDEPENDENT_AMBULATORY_CARE_PROVIDER_SITE_OTHER): Payer: Self-pay | Admitting: Vascular Surgery

## 2020-07-17 DIAGNOSIS — I728 Aneurysm of other specified arteries: Secondary | ICD-10-CM | POA: Insufficient documentation

## 2020-07-17 DIAGNOSIS — I739 Peripheral vascular disease, unspecified: Secondary | ICD-10-CM | POA: Insufficient documentation

## 2020-07-17 NOTE — Progress Notes (Signed)
MRN : [id number]  Corey Olson is a 65 y.o. (15-Mar-1956) male who presents with chief complaint of  Chief Complaint  Patient presents with  . New Patient (Initial Visit)    Ref Abgor-Etang celiac artery aneurysm  .  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  The patient denies abdominal pain or postprandial symptoms.  The patient denies weight loss as well as nausea.  The patient does not substantiate food fear, particular foods do not seem to aggravate or alleviate the symptoms.  The patient denies bloody bowel movements or diarrhea.  The patient has a history of colonoscopy which was not diagnostic.  No history of peptic ulcer disease.   The patient describes claudication symptoms but no rest pain symptoms.  No prior peripheral angiograms or vascular interventions.  The patient denies amaurosis fugax or recent TIA symptoms. There are no recent neurological changes noted. The patient denies history of DVT, PE or superficial thrombophlebitis. The patient denies recent episodes of angina    Current Meds  Medication Sig  . albuterol (VENTOLIN HFA) 108 (90 Base) MCG/ACT inhaler Inhale into the lungs.  Marland Kitchen allopurinol (ZYLOPRIM) 300 MG tablet Take 300 mg by mouth daily.  Marland Kitchen amLODipine (NORVASC) 10 MG tablet Take 1 tablet by mouth daily.  Marland Kitchen aspirin 81 MG EC tablet Take by mouth.  Marland Kitchen atorvastatin (LIPITOR) 80 MG tablet Take 80 mg by mouth daily.  . budesonide-formoterol (SYMBICORT) 160-4.5 MCG/ACT inhaler 1 inhalation once daily.  Marland Kitchen gabapentin (NEURONTIN) 100 MG capsule Take 100 mg by mouth at bedtime.  Marland Kitchen lisinopril (ZESTRIL) 20 MG tablet Take 20 mg by mouth daily.  . metoprolol tartrate (LOPRESSOR) 50 MG tablet Take 1 tablet by mouth daily.  . [DISCONTINUED] colchicine 0.6 MG tablet Take 0.6 mg by mouth daily. (Patient not taking: Reported on 07/13/2020)    Past Medical History:  Diagnosis Date  . Alcohol use disorder, mild,  abuse   . COPD (chronic obstructive pulmonary disease) (HCC)   . Gout   . Hyperlipidemia   . Hypertension   . Hypothyroid   . Peripheral vascular complication   . Stroke [facility])     History reviewed. No pertinent surgical history.  Social History Social History   Tobacco Use  . Smoking status: Current Every Day Smoker    Packs/day: 1.00    Years: 50.00    Pack years: 50.00  . Smokeless tobacco: Never Used  Substance Use Topics  . Alcohol use: Yes    Comment: social  . Drug use: Never    Family History Family History  Problem Relation Age of Onset  . Heart attack Mother   . Heart Problems Father   . Heart attack Father   No family history of bleeding/clotting disorders, porphyria or autoimmune disease   No Known Allergies   REVIEW OF SYSTEMS (Negative unless checked)  Constitutional: [] Weight loss  [] Fever  [] Chills Cardiac: [] Chest pain   [] Chest pressure   [] Palpitations   [] Shortness of breath when laying flat   [] Shortness of breath with exertion. Vascular:  [] Pain in legs with walking   [] Pain in legs at rest  [] History of DVT   [] Phlebitis   [] Swelling in legs   [] Varicose veins   [] Non-healing ulcers Pulmonary:   [] Uses home oxygen   [] Productive cough   [] Hemoptysis   [] Wheeze  [] COPD   [] Asthma Neurologic:  [] Dizziness   [] Seizures   [] History of stroke   []   History of TIA  [] Aphasia   [] Vissual changes   [] Weakness or numbness in arm   [] Weakness or numbness in leg Musculoskeletal:   [] Joint swelling   [] Joint pain   [] Low back pain Hematologic:  [] Easy bruising  [] Easy bleeding   [] Hypercoagulable state   [] Anemic Gastrointestinal:  [] Diarrhea   [] Vomiting  [] Gastroesophageal reflux/heartburn   [] Difficulty swallowing. Genitourinary:  [] Chronic kidney disease   [] Difficult urination  [] Frequent urination   [] Blood in urine Skin:  [] Rashes   [] Ulcers  Psychological:  [] History of anxiety   []  History of major depression.  Physical Examination  Vitals:    07/06/20 1534  BP: 131/71  Pulse: 64  Resp: 16  Weight: 165 lb 6.4 oz (75 kg)  Height: 5\' 7"  (1.702 m)   Body mass index is 25.91 kg/m. Gen: WD/WN, NAD Head: [place]/AT, No temporalis wasting.  Ear/Nose/Throat: Hearing grossly intact, nares w/o erythema or drainage, poor dentition Eyes: PER, EOMI, sclera nonicteric.  Neck: Supple, no masses.  No bruit or JVD.  Pulmonary:  Good air movement, clear to auscultation bilaterally, no use of accessory muscles.  Cardiac: RRR, normal S1, S2, no Murmurs. Vascular:  Vessel Right Left  Radial Palpable Palpable  PT Not Palpable Not Palpable  DP Not Palpable Not Palpable  Gastrointestinal: soft, non-distended. No guarding/no peritoneal signs.  Musculoskeletal: M/S 5/5 throughout.  No deformity or atrophy.  Neurologic: CN 2-12 intact. Pain and light touch intact in extremities.  Symmetrical.  Speech is fluent. Motor exam as listed above. Psychiatric: Judgment intact, Mood & affect appropriate for pt's clinical situation. Dermatologic: No rashes or ulcers noted.  No changes consistent with cellulitis.   CBC No results found for: WBC, HGB, HCT, MCV, PLT  BMET No results found for: NA, K, CL, CO2, GLUCOSE, BUN, CREATININE, CALCIUM, GFRNONAA, GFRAA CrCl cannot be calculated (No successful lab value found.).  COAG No results found for: INR, PROTIME  Radiology NM Myocar Multi W/Spect W/Wall Motion / EF  Result Date: 06/29/2020  No T wave inversion was noted during stress.  Defect 1: There is a medium defect of severe severity present in the basal inferior, basal inferolateral, mid inferior and mid inferolateral location.  Findings consistent with prior myocardial infarction with mild peri-infarct ischemia.  This is an intermediate risk study.  Nuclear stress EF: 41%.  CT attenuation images showed evidence of severe coronary calcifications.    ECHOCARDIOGRAM COMPLETE  Result Date: 07/09/2020    ECHOCARDIOGRAM REPORT   Patient Name:   Corey Olson Date of Exam: 07/09/2020 Medical Rec #:     Height:       67.0 in Accession #:      Weight:       165.4 lb Date of Birth:  09-30-1955     BSA:          1.865 m Patient Age:    64 years     BP:           138/70 mmHg Patient Gender: M            HR:           66 bpm. Exam Location:  [facility] Procedure: 2D Echo, Strain Analysis, Cardiac Doppler and Color Doppler Indications:    R06.02 SOB  History:        Patient has no prior history of Echocardiogram examinations.                 CAD, Stroke and COPD, Signs/Symptoms:Shortness  of Breath; Risk                 Factors:Current Smoker, Hypertension and Dyslipidemia.  Sonographer:    Quentin OreGary Joseph RDMS, RVT, RDCS Referring Phys: [id number] BRIAN AGBOR-ETANG IMPRESSIONS  1. Left ventricular ejection fraction, by estimation, is 45 to 50%. The left ventricle has mildly decreased function. The left ventricle demonstrates global hypokinesis. Left ventricular diastolic parameters are consistent with Grade I diastolic dysfunction (impaired relaxation). The average left ventricular global longitudinal strain is -15.0 %.  2. Right ventricular systolic function is normal. The right ventricular size is normal. Tricuspid regurgitation signal is inadequate for assessing PA pressure.  3. Mild to moderate mitral valve regurgitation.  4. The inferior vena cava is normal in size with greater than 50% respiratory variability, suggesting right atrial pressure of 3 mmHg. FINDINGS  Left Ventricle: Left ventricular ejection fraction, by estimation, is 45 to 50%. The left ventricle has mildly decreased function. The left ventricle demonstrates global hypokinesis. The average left ventricular global longitudinal strain is -15.0 %. The left ventricular internal cavity size was normal in size. There is no left ventricular hypertrophy. Left ventricular diastolic parameters are consistent with Grade I diastolic dysfunction (impaired relaxation). Right Ventricle: The right  ventricular size is normal. No increase in right ventricular wall thickness. Right ventricular systolic function is normal. Tricuspid regurgitation signal is inadequate for assessing PA pressure. Left Atrium: Left atrial size was normal in size. Right Atrium: Right atrial size was normal in size. Pericardium: There is no evidence of pericardial effusion. Mitral Valve: The mitral valve is normal in structure. Mild to moderate mitral valve regurgitation. No evidence of mitral valve stenosis. Tricuspid Valve: The tricuspid valve is normal in structure. Tricuspid valve regurgitation is not demonstrated. No evidence of tricuspid stenosis. Aortic Valve: The aortic valve was not well visualized. Aortic valve regurgitation is not visualized. Mild aortic valve sclerosis is present, with no evidence of aortic valve stenosis. Aortic valve mean gradient measures 4.0 mmHg. Aortic valve peak gradient measures 8.2 mmHg. Aortic valve area, by VTI measures 2.74 cm. Pulmonic Valve: The pulmonic valve was normal in structure. Pulmonic valve regurgitation is not visualized. No evidence of pulmonic stenosis. Aorta: The aortic root is normal in size and structure. Venous: The inferior vena cava is normal in size with greater than 50% respiratory variability, suggesting right atrial pressure of 3 mmHg. IAS/Shunts: No atrial level shunt detected by color flow Doppler.  LEFT VENTRICLE PLAX 2D LVIDd:         4.90 cm     Diastology LVIDs:         3.80 cm     LV e' medial:    6.20 cm/s LV PW:         1.10 cm     LV E/e' medial:  11.5 LV IVS:        1.00 cm     LV e' lateral:   9.79 cm/s LVOT diam:     2.30 cm     LV E/e' lateral: 7.3 LV SV:         77 LV SV Index:   41          2D Longitudinal Strain LVOT Area:     4.15 cm    2D Strain GLS (A2C):   -15.4 %                            2D Strain GLS (A3C):   -  16.5 %                            2D Strain GLS (A4C):   -13.1 % LV Volumes (MOD)           2D Strain GLS Avg:     -15.0 % LV vol d, MOD  A2C: 78.9 ml LV vol d, MOD A4C: 85.9 ml LV vol s, MOD A2C: 46.8 ml LV vol s, MOD A4C: 48.1 ml LV SV MOD A2C:     32.1 ml LV SV MOD A4C:     85.9 ml LV SV MOD BP:      39.3 ml RIGHT VENTRICLE             IVC RV Basal diam:  3.40 cm     IVC diam: 1.70 cm RV S prime:     10.80 cm/s TAPSE (M-mode): 2.3 cm LEFT ATRIUM             Index       RIGHT ATRIUM           Index LA diam:        3.90 cm 2.09 cm/m  RA Area:     13.90 cm LA Vol (A2C):   35.1 ml 18.82 ml/m RA Volume:   31.40 ml  16.83 ml/m LA Vol (A4C):   38.5 ml 20.64 ml/m LA Biplane Vol: 36.8 ml 19.73 ml/m  AORTIC VALVE                   PULMONIC VALVE AV Area (Vmax):    2.41 cm    PV Vmax:       0.76 m/s AV Area (Vmean):   2.49 cm    PV Peak grad:  2.3 mmHg AV Area (VTI):     2.74 cm AV Vmax:           143.00 cm/s AV Vmean:          92.100 cm/s AV VTI:            0.281 m AV Peak Grad:      8.2 mmHg AV Mean Grad:      4.0 mmHg LVOT Vmax:         82.90 cm/s LVOT Vmean:        55.200 cm/s LVOT VTI:          0.185 m LVOT/AV VTI ratio: 0.66  AORTA Ao Root diam: 3.30 cm MITRAL VALVE MV Area (PHT): 3.85 cm    SHUNTS MV Decel Time: 197 msec    Systemic VTI:  0.18 m MV E velocity: 71.10 cm/s  Systemic Diam: 2.30 cm MV A velocity: 92.10 cm/s MV E/A ratio:  0.77 Julien Nordmann MD Electronically signed by Julien Nordmann MD Signature Date/Time: 07/09/2020/6:35:59 PM    Final       Assessment/Plan 1. 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  The patient denies lifestyle limiting changes at this point in time.  Given the lack of symptoms no intervention is warranted at this time.   No further invasive studies, angiography or surgery at this time  The patient should continue walking and begin a more formal exercise program.   The patient should continue antiplatelet therapy and aggressive treatment of the lipid abnormalities  Patient should undergo noninvasive studies as ordered. The patient will follow  up with me after the studies.   - VAS  [place] MESENTERIC; Future  2. PAD (peripheral artery disease) (HCC)  Recommend:  The patient has evidence of atherosclerosis of the lower extremities with claudication.  The patient does not voice lifestyle limiting changes at this point in time.  Noninvasive studies do not suggest clinically significant change.  No invasive studies, angiography or surgery at this time The patient should continue walking and begin a more formal exercise program.  The patient should continue antiplatelet therapy and aggressive treatment of the lipid abnormalities  No changes in the patient's medications at this time  The patient should continue wearing graduated compression socks 10-15 mmHg strength to control the mild edema.   - VAS [place] ABI WITH/WO TBI; Future    [name], MD  07/17/2020 2:58 PM

## 2020-07-29 ENCOUNTER — Other Ambulatory Visit: Payer: Self-pay | Admitting: *Deleted

## 2020-07-29 ENCOUNTER — Other Ambulatory Visit: Payer: Self-pay

## 2020-07-29 ENCOUNTER — Encounter (INDEPENDENT_AMBULATORY_CARE_PROVIDER_SITE_OTHER): Payer: Self-pay | Admitting: Nurse Practitioner

## 2020-07-29 ENCOUNTER — Ambulatory Visit (INDEPENDENT_AMBULATORY_CARE_PROVIDER_SITE_OTHER): Payer: Medicare Other

## 2020-07-29 ENCOUNTER — Ambulatory Visit (INDEPENDENT_AMBULATORY_CARE_PROVIDER_SITE_OTHER): Payer: Medicare Other | Admitting: Nurse Practitioner

## 2020-07-29 ENCOUNTER — Telehealth (INDEPENDENT_AMBULATORY_CARE_PROVIDER_SITE_OTHER): Payer: Self-pay

## 2020-07-29 VITALS — BP 128/80 | HR 58 | Ht 67.0 in | Wt 160.0 lb

## 2020-07-29 DIAGNOSIS — I739 Peripheral vascular disease, unspecified: Secondary | ICD-10-CM

## 2020-07-29 DIAGNOSIS — I728 Aneurysm of other specified arteries: Secondary | ICD-10-CM

## 2020-07-29 DIAGNOSIS — Z122 Encounter for screening for malignant neoplasm of respiratory organs: Secondary | ICD-10-CM

## 2020-07-29 DIAGNOSIS — Z87891 Personal history of nicotine dependence: Secondary | ICD-10-CM | POA: Diagnosis not present

## 2020-07-29 DIAGNOSIS — F17200 Nicotine dependence, unspecified, uncomplicated: Secondary | ICD-10-CM

## 2020-07-29 NOTE — Progress Notes (Signed)
Contacted and scheduled for annual lung screening scan. Patient is a current smoker with a 52 pack year history.

## 2020-07-29 NOTE — H&P (View-Only) (Signed)
Subjective:    Patient ID: Corey Olson, male    DOB: 02-15-1956, 65 y.o.   MRN: [id number] Chief Complaint  Patient presents with  . Follow-up    [place]     Corey Olson is a 65 year old man that returns today for noninvasive studies.  He was previously referred by his cardiologist Dr. 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  The CT scan measures the aneurysm at 1.6 cm.  The patient denies any abdominal pain.  He denies any food fear or postprandial discomfort.  He denies any nausea or vomiting.  The patient does have some significant wheezing however his sister notes that they have an upcoming appointment with his pulmonary doctor today.  Patient denies any significant shortness of breath.  The patient does endorse having significant claudication-like symptoms.  He has difficulty walking more than 50 feet without having severe claudication symptoms.  He denies any wounds or ulcerations.  He denies any rest pain like symptoms.  He denies any fever or chills.  The patient also has some right upper extremity weakness that is noted.  Today the patient has an ABI of 0.94 seen bilaterally.  The TBI 0.62 on the right and 0.77 on the left.  These numbers are likely falsely elevated due to medial calcification.  The patient has dampened monophasic anterior tibial artery waveforms on the right with absent posterior tibial artery waveforms.  The right great toe is severely dampened.  The left lower extremity has an absent anterior tibial artery waveform with severely monophasic posterior tibial artery waveforms.  Patient also has dampened left toe waveforms.  It is noted that the Doppler waveforms suggest some component of arterial occlusive disease.   Review of Systems  Respiratory: Positive for wheezing.   Gastrointestinal: Negative for abdominal pain, nausea and vomiting.  All other systems reviewed and are negative.      Objective:   Physical Exam Vitals  reviewed.  HENT:     Head: Normocephalic.  Cardiovascular:     Rate and Rhythm: Normal rate.     Pulses:          Dorsalis pedis pulses are detected w/ Doppler on the right side.       Posterior tibial pulses are detected w/ Doppler on the right side and 1+ on the left side.  Pulmonary:     Effort: Pulmonary effort is normal.     Breath sounds: Wheezing present.  Neurological:     Mental Status: He is alert and oriented to person, place, and time.     Motor: Weakness present.     Gait: Gait abnormal.  Psychiatric:        Mood and Affect: Mood normal.        Behavior: Behavior normal.        Thought Content: Thought content normal.        Judgment: Judgment normal.     BP 128/80   Pulse (!) 58   Ht 5\' 7"  (1.702 m)   Wt 160 lb (72.6 kg)   BMI 25.06 kg/m   Past Medical History:  Diagnosis Date  . Alcohol use disorder, mild, abuse   . COPD (chronic obstructive pulmonary disease) (HCC)   . Gout   . Hyperlipidemia   . Hypertension   . Hypothyroid   . Peripheral vascular complication   . Stroke [facility])     Social History   Socioeconomic History  . Marital status:  Single    Spouse name: Not on file  . Number of children: Not on file  . Years of education: Not on file  . Highest education level: Not on file  Occupational History  . Not on file  Tobacco Use  . Smoking status: Current Every Day Smoker    Packs/day: 1.00    Years: 50.00    Pack years: 50.00  . Smokeless tobacco: Never Used  Substance and Sexual Activity  . Alcohol use: Yes    Comment: social  . Drug use: Never  . Sexual activity: Not on file  Other Topics Concern  . Not on file  Social History Narrative  . Not on file   Social Determinants of Health   Financial Resource Strain: Not on file  Food Insecurity: Not on file  Transportation Needs: Not on file  Physical Activity: Not on file  Stress: Not on file  Social Connections: Not on file  Intimate Partner Violence: Not on file     History reviewed. No pertinent surgical history.  Family History  Problem Relation Age of Onset  . Heart attack Mother   . Heart Problems Father   . Heart attack Father     No Known Allergies  No flowsheet data found.    CMP  No results found for: NA, K, CL, CO2, GLUCOSE, BUN, CREATININE, CALCIUM, PROT, ALBUMIN, AST, ALT, ALKPHOS, BILITOT, GFRNONAA, GFRAA   No results found.     Assessment & Plan:   1. Celiac artery aneurysm (HCC) Noninvasive studies today did confirm presence of celiac artery aneurysm however it measured at 1.08 cm versus 1.6 suggested on CT scan.  Had a discussion with the patient about visceral artery aneurysms and suggested monitoring.  Typically asymptomatic visceral artery aneurysms under 2.5 cm are at very low risk of rupture.  Currently accepted guidelines indicate treatment of visceral artery aneurysms greater than 2 cm diameter as long as it continues to be asymptomatic.  However the patient becomes symptomatic intervention may be indicated sooner.  We will have the patient return to the office in 6 months for repeat studies to evaluate for possible rapid growth.  Patient will continue with aspirin and statin as prescribed.  2. PAD (peripheral artery disease) (HCC) Recommend:  The patient has experienced increased symptoms and is now describing lifestyle limiting claudication and mild rest pain.   Given the severity of the patient's lower extremity symptoms the patient should undergo angiography and intervention, the right lower extremity should be first and the left second..  Risk and benefits were reviewed the patient.  Indications for the procedure were reviewed.  All questions were answered, the patient agrees to proceed.   The patient should continue walking and begin a more formal exercise program.  The patient should continue antiplatelet therapy and aggressive treatment of the lipid abnormalities  The patient will follow up with me after  the angiogram.   3. Personal history of tobacco use, presenting hazards to health Smoking cessation was discussed, 3-10 minutes spent on this topic specifically    Current Outpatient Medications on File Prior to Visit  Medication Sig Dispense Refill  . albuterol (VENTOLIN HFA) 108 (90 Base) MCG/ACT inhaler Inhale into the lungs.    Marland Kitchen allopurinol (ZYLOPRIM) 300 MG tablet Take 300 mg by mouth daily.    Marland Kitchen amLODipine (NORVASC) 10 MG tablet Take 1 tablet by mouth daily.    Marland Kitchen aspirin 81 MG EC tablet Take by mouth.    Marland Kitchen atorvastatin (LIPITOR)  80 MG tablet Take 80 mg by mouth daily.    . budesonide-formoterol (SYMBICORT) 160-4.5 MCG/ACT inhaler 1 inhalation once daily.    Marland Kitchen gabapentin (NEURONTIN) 100 MG capsule Take 100 mg by mouth at bedtime.    Marland Kitchen lisinopril (ZESTRIL) 20 MG tablet Take 20 mg by mouth daily.    . metoprolol tartrate (LOPRESSOR) 50 MG tablet Take 1 tablet by mouth daily.     No current facility-administered medications on file prior to visit.    There are no Patient Instructions on file for this visit. No follow-ups on file.   Georgiana Spinner, NP

## 2020-07-29 NOTE — Telephone Encounter (Signed)
Spoke with the patient's wife and he is scheduled for a RLE angio on 08/25/20 (9:00 am arrival) and LLE angio 09/01/20 (6:45 am arrival) with Dr. Gilda Crease at [facility]. Covid testing on 08/21/20 between 8-2 and 08/28/20 between 8-2 at the [facility]. Pre-procedure instructions were discussed and will be mailed.

## 2020-07-29 NOTE — Progress Notes (Signed)
Subjective:    Patient ID: Corey Olson, male    DOB: 02-15-1956, 65 y.o.   MRN: [id number] Chief Complaint  Patient presents with  . Follow-up    [place]     Corey Olson is a 65 year old man that returns today for noninvasive studies.  He was previously referred by his cardiologist Dr. 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  The CT scan measures the aneurysm at 1.6 cm.  The patient denies any abdominal pain.  He denies any food fear or postprandial discomfort.  He denies any nausea or vomiting.  The patient does have some significant wheezing however his sister notes that they have an upcoming appointment with his pulmonary doctor today.  Patient denies any significant shortness of breath.  The patient does endorse having significant claudication-like symptoms.  He has difficulty walking more than 50 feet without having severe claudication symptoms.  He denies any wounds or ulcerations.  He denies any rest pain like symptoms.  He denies any fever or chills.  The patient also has some right upper extremity weakness that is noted.  Today the patient has an ABI of 0.94 seen bilaterally.  The TBI 0.62 on the right and 0.77 on the left.  These numbers are likely falsely elevated due to medial calcification.  The patient has dampened monophasic anterior tibial artery waveforms on the right with absent posterior tibial artery waveforms.  The right great toe is severely dampened.  The left lower extremity has an absent anterior tibial artery waveform with severely monophasic posterior tibial artery waveforms.  Patient also has dampened left toe waveforms.  It is noted that the Doppler waveforms suggest some component of arterial occlusive disease.   Review of Systems  Respiratory: Positive for wheezing.   Gastrointestinal: Negative for abdominal pain, nausea and vomiting.  All other systems reviewed and are negative.      Objective:   Physical Exam Vitals  reviewed.  HENT:     Head: Normocephalic.  Cardiovascular:     Rate and Rhythm: Normal rate.     Pulses:          Dorsalis pedis pulses are detected w/ Doppler on the right side.       Posterior tibial pulses are detected w/ Doppler on the right side and 1+ on the left side.  Pulmonary:     Effort: Pulmonary effort is normal.     Breath sounds: Wheezing present.  Neurological:     Mental Status: He is alert and oriented to person, place, and time.     Motor: Weakness present.     Gait: Gait abnormal.  Psychiatric:        Mood and Affect: Mood normal.        Behavior: Behavior normal.        Thought Content: Thought content normal.        Judgment: Judgment normal.     BP 128/80   Pulse (!) 58   Ht 5\' 7"  (1.702 m)   Wt 160 lb (72.6 kg)   BMI 25.06 kg/m   Past Medical History:  Diagnosis Date  . Alcohol use disorder, mild, abuse   . COPD (chronic obstructive pulmonary disease) (HCC)   . Gout   . Hyperlipidemia   . Hypertension   . Hypothyroid   . Peripheral vascular complication   . Stroke [facility])     Social History   Socioeconomic History  . Marital status:  Single    Spouse name: Not on file  . Number of children: Not on file  . Years of education: Not on file  . Highest education level: Not on file  Occupational History  . Not on file  Tobacco Use  . Smoking status: Current Every Day Smoker    Packs/day: 1.00    Years: 50.00    Pack years: 50.00  . Smokeless tobacco: Never Used  Substance and Sexual Activity  . Alcohol use: Yes    Comment: social  . Drug use: Never  . Sexual activity: Not on file  Other Topics Concern  . Not on file  Social History Narrative  . Not on file   Social Determinants of Health   Financial Resource Strain: Not on file  Food Insecurity: Not on file  Transportation Needs: Not on file  Physical Activity: Not on file  Stress: Not on file  Social Connections: Not on file  Intimate Partner Violence: Not on file     History reviewed. No pertinent surgical history.  Family History  Problem Relation Age of Onset  . Heart attack Mother   . Heart Problems Father   . Heart attack Father     No Known Allergies  No flowsheet data found.    CMP  No results found for: NA, K, CL, CO2, GLUCOSE, BUN, CREATININE, CALCIUM, PROT, ALBUMIN, AST, ALT, ALKPHOS, BILITOT, GFRNONAA, GFRAA   No results found.     Assessment & Plan:   1. Celiac artery aneurysm (HCC) Noninvasive studies today did confirm presence of celiac artery aneurysm however it measured at 1.08 cm versus 1.6 suggested on CT scan.  Had a discussion with the patient about visceral artery aneurysms and suggested monitoring.  Typically asymptomatic visceral artery aneurysms under 2.5 cm are at very low risk of rupture.  Currently accepted guidelines indicate treatment of visceral artery aneurysms greater than 2 cm diameter as long as it continues to be asymptomatic.  However the patient becomes symptomatic intervention may be indicated sooner.  We will have the patient return to the office in 6 months for repeat studies to evaluate for possible rapid growth.  Patient will continue with aspirin and statin as prescribed.  2. PAD (peripheral artery disease) (HCC) Recommend:  The patient has experienced increased symptoms and is now describing lifestyle limiting claudication and mild rest pain.   Given the severity of the patient's lower extremity symptoms the patient should undergo angiography and intervention, the right lower extremity should be first and the left second..  Risk and benefits were reviewed the patient.  Indications for the procedure were reviewed.  All questions were answered, the patient agrees to proceed.   The patient should continue walking and begin a more formal exercise program.  The patient should continue antiplatelet therapy and aggressive treatment of the lipid abnormalities  The patient will follow up with me after  the angiogram.   3. Personal history of tobacco use, presenting hazards to health Smoking cessation was discussed, 3-10 minutes spent on this topic specifically    Current Outpatient Medications on File Prior to Visit  Medication Sig Dispense Refill  . albuterol (VENTOLIN HFA) 108 (90 Base) MCG/ACT inhaler Inhale into the lungs.    Marland Kitchen allopurinol (ZYLOPRIM) 300 MG tablet Take 300 mg by mouth daily.    Marland Kitchen amLODipine (NORVASC) 10 MG tablet Take 1 tablet by mouth daily.    Marland Kitchen aspirin 81 MG EC tablet Take by mouth.    Marland Kitchen atorvastatin (LIPITOR)  80 MG tablet Take 80 mg by mouth daily.    . budesonide-formoterol (SYMBICORT) 160-4.5 MCG/ACT inhaler 1 inhalation once daily.    . gabapentin (NEURONTIN) 100 MG capsule Take 100 mg by mouth at bedtime.    . lisinopril (ZESTRIL) 20 MG tablet Take 20 mg by mouth daily.    . metoprolol tartrate (LOPRESSOR) 50 MG tablet Take 1 tablet by mouth daily.     No current facility-administered medications on file prior to visit.    There are no Patient Instructions on file for this visit. No follow-ups on file.   Tyrah Broers E Yolunda Kloos, NP   

## 2020-08-05 ENCOUNTER — Other Ambulatory Visit: Payer: Self-pay

## 2020-08-05 ENCOUNTER — Ambulatory Visit
Admission: RE | Admit: 2020-08-05 | Discharge: 2020-08-05 | Disposition: A | Discharge status: Home / Self Care | Payer: Medicare Other | Source: Ambulatory Visit | Attending: Oncology | Admitting: Oncology

## 2020-08-05 DIAGNOSIS — F17200 Nicotine dependence, unspecified, uncomplicated: Secondary | ICD-10-CM | POA: Insufficient documentation

## 2020-08-05 DIAGNOSIS — Z87891 Personal history of nicotine dependence: Secondary | ICD-10-CM | POA: Diagnosis not present

## 2020-08-05 DIAGNOSIS — Z122 Encounter for screening for malignant neoplasm of respiratory organs: Secondary | ICD-10-CM | POA: Diagnosis present

## 2020-08-13 ENCOUNTER — Encounter: Payer: Self-pay | Admitting: *Deleted

## 2020-08-13 NOTE — Progress Notes (Signed)
note

## 2020-08-21 ENCOUNTER — Other Ambulatory Visit
Admission: RE | Admit: 2020-08-21 | Discharge: 2020-08-21 | Disposition: A | Discharge status: Home / Self Care | Payer: Medicare Other | Source: Ambulatory Visit | Attending: Vascular Surgery | Admitting: Vascular Surgery

## 2020-08-21 ENCOUNTER — Other Ambulatory Visit: Payer: Self-pay

## 2020-08-21 DIAGNOSIS — Z20822 Contact with and (suspected) exposure to covid-19: Secondary | ICD-10-CM | POA: Diagnosis not present

## 2020-08-21 DIAGNOSIS — Z01812 Encounter for preprocedural laboratory examination: Secondary | ICD-10-CM | POA: Diagnosis present

## 2020-08-22 LAB — SARS CORONAVIRUS 2 (TAT 6-24 HRS): SARS Coronavirus 2: NEGATIVE

## 2020-08-25 ENCOUNTER — Ambulatory Visit
Admission: RE | Admit: 2020-08-25 | Discharge: 2020-08-25 | Disposition: A | Discharge status: Home / Self Care | Payer: Medicare Other | Attending: Vascular Surgery | Admitting: Vascular Surgery

## 2020-08-25 ENCOUNTER — Other Ambulatory Visit (INDEPENDENT_AMBULATORY_CARE_PROVIDER_SITE_OTHER): Payer: Self-pay | Admitting: Nurse Practitioner

## 2020-08-25 ENCOUNTER — Encounter
Admission: RE | Disposition: A | Discharge status: Home / Self Care | Payer: Self-pay | Source: Home / Self Care | Attending: Vascular Surgery

## 2020-08-25 ENCOUNTER — Other Ambulatory Visit: Payer: Self-pay

## 2020-08-25 DIAGNOSIS — E785 Hyperlipidemia, unspecified: Secondary | ICD-10-CM | POA: Diagnosis not present

## 2020-08-25 DIAGNOSIS — Z7982 Long term (current) use of aspirin: Secondary | ICD-10-CM | POA: Insufficient documentation

## 2020-08-25 DIAGNOSIS — I70219 Atherosclerosis of native arteries of extremities with intermittent claudication, unspecified extremity: Secondary | ICD-10-CM

## 2020-08-25 DIAGNOSIS — Z8673 Personal history of transient ischemic attack (TIA), and cerebral infarction without residual deficits: Secondary | ICD-10-CM | POA: Insufficient documentation

## 2020-08-25 DIAGNOSIS — Z7951 Long term (current) use of inhaled steroids: Secondary | ICD-10-CM | POA: Diagnosis not present

## 2020-08-25 DIAGNOSIS — Z8249 Family history of ischemic heart disease and other diseases of the circulatory system: Secondary | ICD-10-CM | POA: Diagnosis not present

## 2020-08-25 DIAGNOSIS — I10 Essential (primary) hypertension: Secondary | ICD-10-CM | POA: Diagnosis not present

## 2020-08-25 DIAGNOSIS — I70223 Atherosclerosis of native arteries of extremities with rest pain, bilateral legs: Secondary | ICD-10-CM | POA: Insufficient documentation

## 2020-08-25 DIAGNOSIS — I728 Aneurysm of other specified arteries: Secondary | ICD-10-CM | POA: Insufficient documentation

## 2020-08-25 DIAGNOSIS — F17210 Nicotine dependence, cigarettes, uncomplicated: Secondary | ICD-10-CM | POA: Diagnosis not present

## 2020-08-25 DIAGNOSIS — F1010 Alcohol abuse, uncomplicated: Secondary | ICD-10-CM | POA: Diagnosis not present

## 2020-08-25 DIAGNOSIS — E039 Hypothyroidism, unspecified: Secondary | ICD-10-CM | POA: Diagnosis not present

## 2020-08-25 DIAGNOSIS — Z79899 Other long term (current) drug therapy: Secondary | ICD-10-CM | POA: Diagnosis not present

## 2020-08-25 HISTORY — PX: LOWER EXTREMITY ANGIOGRAPHY: CATH118251

## 2020-08-25 LAB — CREATININE, SERUM
Creatinine, Ser: 1.14 mg/dL (ref 0.61–1.24)
GFR, Estimated: >60 mL/min (ref >60)

## 2020-08-25 LAB — BUN: BUN: 10 mg/dL (ref 8–23)

## 2020-08-25 SURGERY — LOWER EXTREMITY ANGIOGRAPHY
Anesthesia: Moderate Sedation | Site: Leg Lower | Laterality: Right

## 2020-08-25 MED ORDER — SODIUM CHLORIDE 0.9 % IV SOLN: INTRAVENOUS | Status: DC

## 2020-08-25 MED ORDER — SODIUM CHLORIDE 0.9% FLUSH: 3.0000 mL | INTRAVENOUS | Status: DC | PRN

## 2020-08-25 MED ORDER — FAMOTIDINE 20 MG PO TABS: 40.0000 mg | ORAL_TABLET | Freq: Once | ORAL | Status: DC | PRN

## 2020-08-25 MED ORDER — FENTANYL CITRATE (PF) 100 MCG/2ML IJ SOLN
INTRAMUSCULAR | Status: DC | PRN
  Administered 2020-08-25 (×2): 50 ug via INTRAVENOUS

## 2020-08-25 MED ORDER — HYDRALAZINE HCL 20 MG/ML IJ SOLN: 5.0000 mg | INTRAMUSCULAR | Status: DC | PRN

## 2020-08-25 MED ORDER — METHYLPREDNISOLONE SODIUM SUCC 125 MG IJ SOLR: 125.0000 mg | Freq: Once | INTRAMUSCULAR | Status: DC | PRN

## 2020-08-25 MED ORDER — CLOPIDOGREL BISULFATE 75 MG PO TABS
ORAL_TABLET | ORAL | Status: AC
  Administered 2020-08-25: 300 mg via ORAL
  Filled 2020-08-25: qty 4

## 2020-08-25 MED ORDER — CLOPIDOGREL BISULFATE 75 MG PO TABS: 75.0000 mg | ORAL_TABLET | Freq: Every day | ORAL | 5 refills | Status: DC

## 2020-08-25 MED ORDER — ONDANSETRON HCL 4 MG/2ML IJ SOLN: 4.0000 mg | Freq: Four times a day (QID) | INTRAMUSCULAR | Status: DC | PRN

## 2020-08-25 MED ORDER — ACETAMINOPHEN 325 MG PO TABS: 650.0000 mg | ORAL_TABLET | ORAL | Status: DC | PRN

## 2020-08-25 MED ORDER — SODIUM CHLORIDE 0.9 % IV SOLN: 250.0000 mL | INTRAVENOUS | Status: DC | PRN

## 2020-08-25 MED ORDER — MORPHINE SULFATE (PF) 4 MG/ML IV SOLN: 2.0000 mg | INTRAVENOUS | Status: DC | PRN

## 2020-08-25 MED ORDER — LABETALOL HCL 5 MG/ML IV SOLN
10.0000 mg | INTRAVENOUS | Status: DC | PRN
Start: 2020-08-25 — End: 2020-08-25

## 2020-08-25 MED ORDER — HEPARIN SODIUM (PORCINE) 1000 UNIT/ML IJ SOLN
INTRAMUSCULAR | Status: AC
  Filled 2020-08-25: qty 1

## 2020-08-25 MED ORDER — CEFAZOLIN SODIUM-DEXTROSE 2-4 GM/100ML-% IV SOLN
2.0000 g | Freq: Once | INTRAVENOUS | Status: AC
  Administered 2020-08-25: 2 g via INTRAVENOUS

## 2020-08-25 MED ORDER — SODIUM CHLORIDE 0.9% FLUSH: 3.0000 mL | Freq: Two times a day (BID) | INTRAVENOUS | Status: DC

## 2020-08-25 MED ORDER — DIPHENHYDRAMINE HCL 50 MG/ML IJ SOLN: 50.0000 mg | Freq: Once | INTRAMUSCULAR | Status: DC | PRN

## 2020-08-25 MED ORDER — IODIXANOL 320 MG/ML IV SOLN
INTRAVENOUS | Status: DC | PRN
Start: 2020-08-25 — End: 2020-08-25
  Administered 2020-08-25: 120 mL

## 2020-08-25 MED ORDER — HYDROMORPHONE HCL 1 MG/ML IJ SOLN: 1.0000 mg | Freq: Once | INTRAMUSCULAR | Status: DC | PRN

## 2020-08-25 MED ORDER — OXYCODONE HCL 5 MG PO TABS: 5.0000 mg | ORAL_TABLET | ORAL | Status: DC | PRN

## 2020-08-25 MED ORDER — CLOPIDOGREL BISULFATE 300 MG PO TABS: 300.0000 mg | ORAL_TABLET | ORAL | Status: AC

## 2020-08-25 MED ORDER — MIDAZOLAM HCL 2 MG/2ML IJ SOLN
INTRAMUSCULAR | Status: DC | PRN
  Administered 2020-08-25 (×2): 1 mg via INTRAVENOUS

## 2020-08-25 MED ORDER — MIDAZOLAM HCL 5 MG/5ML IJ SOLN
INTRAMUSCULAR | Status: AC
  Filled 2020-08-25: qty 5

## 2020-08-25 MED ORDER — FENTANYL CITRATE (PF) 100 MCG/2ML IJ SOLN
INTRAMUSCULAR | Status: AC
  Filled 2020-08-25: qty 2

## 2020-08-25 MED ORDER — HEPARIN SODIUM (PORCINE) 1000 UNIT/ML IJ SOLN
INTRAMUSCULAR | Status: DC | PRN
  Administered 2020-08-25: 4000 [IU] via INTRAVENOUS

## 2020-08-25 MED ORDER — MIDAZOLAM HCL 2 MG/ML PO SYRP: 8.0000 mg | ORAL_SOLUTION | Freq: Once | ORAL | Status: DC | PRN

## 2020-08-25 SURGICAL SUPPLY — 23 items
BALLN LUTONIX AV 8X60X75 (BALLOONS) ×4
BALLN ULTRVRSE 10X40X75 (BALLOONS) ×2
BALLN ULTRVRSE 9X80X75 (BALLOONS) ×2
BALLOON LUTONIX AV 8X60X75 (BALLOONS) ×2 IMPLANT
BALLOON ULTRVRSE 10X40X75 (BALLOONS) ×1 IMPLANT
BALLOON ULTRVRSE 9X80X75 (BALLOONS) ×1 IMPLANT
CANNULA 5F STIFF (CANNULA) ×2 IMPLANT
CATH ANGIO 5F PIGTAIL 65CM (CATHETERS) ×2 IMPLANT
COVER PROBE U/S 5X48 (MISCELLANEOUS) ×2 IMPLANT
DEVICE STARCLOSE SE CLOSURE (Vascular Products) ×2 IMPLANT
GLIDEWIRE ADV .035X260CM (WIRE) ×2 IMPLANT
KIT ENCORE 26 ADVANTAGE (KITS) ×2 IMPLANT
PACK ANGIOGRAPHY (CUSTOM PROCEDURE TRAY) ×2 IMPLANT
SHEATH BALKIN 7FR (SHEATH) ×2 IMPLANT
SHEATH BRITE TIP 5FRX11 (SHEATH) ×2 IMPLANT
STENT LIFESTAR 10X60 (Permanent Stent) ×2 IMPLANT
STENT LIFESTAR 9X40 (Permanent Stent) ×2 IMPLANT
STENT LIFESTREAM 8X26X135 (Permanent Stent) ×2 IMPLANT
STENT LIFESTREAM 9X58X80 (Permanent Stent) ×4 IMPLANT
SYR MEDRAD MARK 7 150ML (SYRINGE) ×2 IMPLANT
TUBING CONTRAST HIGH PRESS 72 (TUBING) ×2 IMPLANT
WIRE GUIDERIGHT .035X150 (WIRE) ×2 IMPLANT
WIRE NITINOL .018 (WIRE) ×2 IMPLANT

## 2020-08-25 NOTE — Discharge Instructions (Signed)
Femoral Site Care  This sheet gives you information about how to care for yourself after your procedure. Your health care provider may also give you more specific instructions. If you have problems or questions, contact your health care provider. What can I expect after the procedure? After the procedure, it is common to have:  Bruising that usually fades within 1-2 weeks.  Tenderness at the site. Follow these instructions at home: Wound care  Follow instructions from your health care provider about how to take care of your insertion site. Make sure you: ? Wash your hands with soap and water before you change your bandage (dressing). If soap and water are not available, use hand sanitizer. ? Change your dressing as told by your health care provider. ? Leave stitches (sutures), skin glue, or adhesive strips in place. These skin closures may need to stay in place for 2 weeks or longer. If adhesive strip edges start to loosen and curl up, you may trim the loose edges. Do not remove adhesive strips completely unless your health care provider tells you to do that.  Do not take baths, swim, or use a hot tub until your health care provider approves.  You may shower 24-48 hours after the procedure or as told by your health care provider. ? Gently wash the site with plain soap and water. ? Pat the area dry with a clean towel. ? Do not rub the site. This may cause bleeding.  Do not apply powder or lotion to the site. Keep the site clean and dry.  Check your femoral site every day for signs of infection. Check for: ? Redness, swelling, or pain. ? Fluid or blood. ? Warmth. ? Pus or a bad smell. Activity  For the first 2-3 days after your procedure, or as long as directed: ? Avoid climbing stairs as much as possible. ? Do not squat.  Do not lift anything that is heavier than 10 lb (4.5 kg), or the limit that you are told, until your health care provider says that it is safe.  Rest as  directed. ? Avoid sitting for a long time without moving. Get up to take short walks every 1-2 hours.  Do not drive for 24 hours if you were given a medicine to help you relax (sedative). General instructions  Take over-the-counter and prescription medicines only as told by your health care provider.  Keep all follow-up visits as told by your health care provider. This is important. Contact a health care provider if you have:  A fever or chills.  You have redness, swelling, or pain around your insertion site. Get help right away if:  The catheter insertion area swells very fast.  You pass out.  You suddenly start to sweat or your skin gets clammy.  The catheter insertion area is bleeding, and the bleeding does not stop when you hold steady pressure on the area.  The area near or just beyond the catheter insertion site becomes pale, cool, tingly, or numb. These symptoms may represent a serious problem that is an emergency. Do not wait to see if the symptoms will go away. Get medical help right away. Call your local emergency services (911 in the [place] Do not drive yourself to the hospital. Summary  After the procedure, it is common to have bruising that usually fades within 1-2 weeks.  Check your femoral site every day for signs of infection.  Do not lift anything that is heavier than 10 lb (4.5 kg), or   the limit that you are told, until your health care provider says that it is safe. This information is not intended to replace advice given to you by your health care provider. Make sure you discuss any questions you have with your health care provider. Document Revised: 01/10/2020 Document Reviewed: 01/10/2020 Elsevier Patient Education  2021 [employer] Moderate Conscious Sedation, Adult, Care After This sheet gives you information about how to care for yourself after your procedure. Your health care provider may also give you more specific instructions. If you have problems  or questions, contact your health care provider. What can I expect after the procedure? After the procedure, it is common to have:  Sleepiness for several hours.  Impaired judgment for several hours.  Difficulty with balance.  Vomiting if you eat too soon. Follow these instructions at home: For the time period you were told by your health care provider:  Rest.  Do not participate in activities where you could fall or become injured.  Do not drive or use machinery.  Do not drink alcohol.  Do not take sleeping pills or medicines that cause drowsiness.  Do not make important decisions or sign legal documents.  Do not take care of children on your own.      Eating and drinking  Follow the diet recommended by your health care provider.  Drink enough fluid to keep your urine pale yellow.  If you vomit: ? Drink water, juice, or soup when you can drink without vomiting. ? Make sure you have little or no nausea before eating solid foods.   General instructions  Take over-the-counter and prescription medicines only as told by your health care provider.  Have a responsible adult stay with you for the time you are told. It is important to have someone help care for you until you are awake and alert.  Do not smoke.  Keep all follow-up visits as told by your health care provider. This is important. Contact a health care provider if:  You are still sleepy or having trouble with balance after 24 hours.  You feel light-headed.  You keep feeling nauseous or you keep vomiting.  You develop a rash.  You have a fever.  You have redness or swelling around the IV site. Get help right away if:  You have trouble breathing.  You have new-onset confusion at home. Summary  After the procedure, it is common to feel sleepy, have impaired judgment, or feel nauseous if you eat too soon.  Rest after you get home. Know the things you should not do after the procedure.  Follow the  diet recommended by your health care provider and drink enough fluid to keep your urine pale yellow.  Get help right away if you have trouble breathing or new-onset confusion at home. This information is not intended to replace advice given to you by your health care provider. Make sure you discuss any questions you have with your health care provider. Document Revised: 09/06/2019 Document Reviewed: 04/04/2019 Elsevier Patient Education  2021 [employer]  

## 2020-08-25 NOTE — Op Note (Signed)
[facility] Percutaneous Study/Intervention Procedural Note   Date of Surgery: 08/25/2020  Surgeon: Hortencia Pilar  Pre-operative Diagnosis: Atherosclerotic occlusive disease bilateral lower extremities with rest pain of the bilateral lower extremity  Post-operative diagnosis: Same  Procedure(s) Performed: 1. Introduction catheter into right lower extremity 3rd order catheter placement  2. Contrast injection bilateral lower extremity for distal runoff  3. Percutaneous transluminal angioplasty and stent placement right common iliac artery and right external iliac artery. 4. Percutaneous transluminal angioplasty and stent placement left common iliac artery and left external iliac artery  5. Star close closure left common femoral arteriotomy  Anesthesia: Conscious sedation was administered under my direct supervision by the interventional radiology RN. IV Versed plus fentanyl were utilized. Continuous ECG, pulse oximetry and blood pressure was monitored throughout the entire procedure.  Conscious sedation was for a total of 1 hour 16 minutes and 34 seconds.  Sheath: 7 [place] Balkan left common femoral retrograde  Contrast: 120 cc  Fluoroscopy Time: 10.5 minutes  Indications: Corey Olson presents with increasing pain of the bilateral lower extremity.  Noninvasive studies demonstrate flat tracings throughout suggesting profound atherosclerotic occlusive disease.  This suggests the patient is having limb threatening ischemia. The risks and benefits are reviewed all questions answered family agrees to proceed with angiography and possible intervention with the hope for limb salvage.  Procedure:Corey Olson is a 65 y.o. y.o. male who was identified and appropriate procedural time out was performed. The patient was then placed supine on the table and prepped and draped in the usual sterile fashion.    Ultrasound was placed in the sterile sleeve and the left groin was evaluated the left common femoral artery was echolucent and pulsatile indicating patency. Image was recorded for the permanent record and under real-time visualization a microneedle was inserted into the common femoral artery followed by the microwire and then the micro-sheath. A J-wire was then advanced through the micro-sheath and a 5 [place] sheath was then inserted over a J-wire. J-wire was then advanced and a 5 French pigtail catheter was positioned at the level of T12.  AP projection of the aorta was then obtained. Pigtail catheter was repositioned to above the bifurcation and a LAO and RAO view of the pelvis was obtained. Subsequently a pigtail catheter with the stiff angle Glidewire was used to cross the aortic bifurcation the catheter wire were advanced down into the right distal external iliac artery. Oblique view of the femoral bifurcation was then obtained and subsequently the wire was reintroduced and the pigtail catheter negotiated into the SFA representing third order catheter placement. Distal runoff was then performed.  Once the right distal runoff was obtained the advantage wire was reintroduced and positioned with its tip in the SFA.  Hand-injection contrast through the left femoral sheath was then used to perform distal runoff of the left lower extremity.  Diagnostic interpretation: The abdominal aorta is opacified with a bolus injection contrast.  Diffuse atherosclerotic changes are noted but there are no hemodynamically significant lesions within the aorta.  Bilateral nephrograms are identified with single renal arteries.  There is greater than 70% bilateral renal artery stenosis.  The aortic bifurcation and the origins of the common iliac arteries demonstrate mild atherosclerotic changes on the right with a 60% stenosis on the left.  On the right in the mid to distal common iliac artery there is a greater than 90%  stenosis.  The right external iliac artery is diffusely diseased with 2 focal areas of greater  than 70% stenosis.  On the left there is the above-noted ostial common iliac lesion.  In the more distal common iliac there is a greater than 70% stenosis.  The proximal external iliac artery on the left is patent distally there is a greater than 80% stenosis just proximal to the level of the ileal inguinal ligament.  Bilateral internal iliac arteries are occluded.  The right common femoral demonstrates extensive disease with 2 areas of greater than 90% stenosis.  The profunda femoris appears to be occluded.  The superficial femoral artery is profoundly diseased and then occludes approximately 10 cm from its origin.  There is reconstitution of the at knee popliteal although it appears diffusely diseased.  Trifurcation is extensively diseased with occlusion of the posterior tibial throughout its course the tibioperoneal trunk peroneal appear patent.  The anterior tibial is patent but diffusely diseased and does not appear to cross the ankle.  The left common femoral demonstrates extensive disease with an area of greater than 80% stenosis just proximal to the femoral bifurcation.  There is a greater than 80% stenosis in the proximal profunda femoris and then it is diffusely diseased distally but does have extensive collaterals.  SFA is a flush occlusion it reconstitutes just below Hunter's canal.  Popliteal is diffusely diseased similar to the right side but it is patent.  There is two-vessel runoff on the right via the patent tibioperoneal trunk filling the posterior tibial and the peroneal.  Posterior tibial does cross the ankle and fills the plantar arteries.  4000 units of heparin was then given and allowed to circulate and a 7 [place] Balkan sheath was advanced up and over the bifurcation and positioned in the femoral artery  Initially the right common iliac artery was addressed.  A 9 x 58 lifestream stent was  deployed across the critical lesion.  Next the detector was repositioned and a 10 mm x 60 mm life star stent was deployed beginning at the level of the ileal inguinal ligament and deploying more proximally.  The life star stent was then postdilated with a 8 mm x 60 mm Lutonix drug-eluting balloon inflated to 8 atm for 1 minute.  The lifestream stent was then postdilated with a 10 mm x 40 mm Ultraverse balloon so that it would more appropriately approximate the walls of the common iliac.  Follow-up imaging through the Balkan sheath now demonstrated less than 5% residual stenosis with wide patency of the common and external iliac artery filling the right common femoral.  Attention was then turned to the left side where the Balkan sheath was pulled back magnified imaging were made and a 9 mm x 58 mm life stream stent was deployed beginning right at the origin of the left common iliac and extending it distally.  A second lifestream stent 8 x 26 was also deployed to completely cover the lesion.  These 2 stents were then postdilated with a 9 mm x 80 mm Ultraverse balloon inflated to 10 atm for 1 minute.  Attention was then turned to the lesion in the distal external iliac artery on the left which was treated with a 9 mm x 40 mm life star stent which was then postdilated with a 8 mm x 40 mm Lutonix drug-eluting balloon inflated to 10 atm for 1 minute.  Follow-up imaging on the left side now demonstrated less than 5% residual stenosis with wide patency of the common and external iliac arteries.  After review of these images the sheath is pulled  into the left external iliac oblique of the common femoral is obtained and a Star close device deployed. There no immediate Complications.  Findings:  The abdominal aorta is opacified with a bolus injection contrast.  Diffuse atherosclerotic changes are noted but there are no hemodynamically significant lesions within the aorta.  Bilateral nephrograms are identified with  single renal arteries.  There is greater than 70% bilateral renal artery stenosis.  The aortic bifurcation and the origins of the common iliac arteries demonstrate mild atherosclerotic changes on the right with a 60% stenosis on the left.  On the right in the mid to distal common iliac artery there is a greater than 90% stenosis.  The right external iliac artery is diffusely diseased with 2 focal areas of greater than 70% stenosis.  On the left there is the above-noted ostial common iliac lesion.  In the more distal common iliac there is a greater than 70% stenosis.  The proximal external iliac artery on the left is patent distally there is a greater than 80% stenosis just proximal to the level of the ileal inguinal ligament.  Bilateral internal iliac arteries are occluded.  The right common femoral demonstrates extensive disease with 2 areas of greater than 90% stenosis.  The profunda femoris appears to be occluded.  The superficial femoral artery is profoundly diseased and then occludes approximately 10 cm from its origin.  There is reconstitution of the at knee popliteal although it appears diffusely diseased.  Trifurcation is extensively diseased with occlusion of the posterior tibial throughout its course the tibioperoneal trunk peroneal appear patent.  The anterior tibial is patent but diffusely diseased and does not appear to cross the ankle.  The left common femoral demonstrates extensive disease with an area of greater than 80% stenosis just proximal to the femoral bifurcation.  There is a greater than 80% stenosis in the proximal profunda femoris and then it is diffusely diseased distally but does have extensive collaterals.  SFA is a flush occlusion it reconstitutes just below Hunter's canal.  Popliteal is diffusely diseased similar to the right side but it is patent.  There is two-vessel runoff on the right via the patent tibioperoneal trunk filling the posterior tibial and the peroneal.  Posterior  tibial does cross the ankle and fills the plantar arteries.  Following angioplasty and stent placement of the right external and common iliac now demonstrates wide patency with less than 5% residual stenosis.  Similarly on the left stent placement within the left common and left external iliac artery are well positioned and there is less than 5% residual stenosis.  Summary: Successful recanalization of the iliac system for inflow into the bilateral lower extremities.  The common femoral lesions will require open endarterectomy at which time the SFA occlusions can be reconstructed.   Disposition: Patient was taken to the recovery room in stable condition having tolerated the procedure well.  Corey Olson 08/25/2020,12:42 PM

## 2020-08-25 NOTE — Interval H&P Note (Signed)
History and Physical Interval Note:  08/25/2020 10:47 AM  Corey Olson  has presented today for surgery, with the diagnosis of RLE Angiography   ASO with claudication   BARD Rep  cc: [name] Pt to have Covid test on 4-1.  The various methods of treatment have been discussed with the patient and family. After consideration of risks, benefits and other options for treatment, the patient has consented to  Procedure(s): LOWER EXTREMITY ANGIOGRAPHY (Right) as a surgical intervention.  The patient's history has been reviewed, patient examined, no change in status, stable for surgery.  I have reviewed the patient's chart and labs.  Questions were answered to the patient's satisfaction.     [name]

## 2020-08-25 NOTE — Progress Notes (Signed)
Left groin with slight ooze; drsg. Removed : noted a "blebL of blood at site of BRB. Manual pressure held x 5 min. Mickie Kay, PA made aware of situation.

## 2020-08-25 NOTE — Progress Notes (Signed)
Otilio Connors, PA in at bedside to evaluate left groin status secondary continues to ooze slowly under drsg. No hematoma at site. PA injected Lidocaine into groin , inserted internal stitch , and applied Dermabond to site. To wait 10 min. Per PA request, then apply sterile 2x2 and Tegaderm to site. Pt. Tolerated well.

## 2020-08-27 ENCOUNTER — Encounter: Payer: Self-pay | Admitting: Vascular Surgery

## 2020-08-28 ENCOUNTER — Other Ambulatory Visit: Admission: RE | Admit: 2020-08-28 | Payer: Medicare Other | Source: Ambulatory Visit

## 2020-08-31 ENCOUNTER — Other Ambulatory Visit (INDEPENDENT_AMBULATORY_CARE_PROVIDER_SITE_OTHER): Payer: Self-pay | Admitting: Nurse Practitioner

## 2020-08-31 ENCOUNTER — Telehealth (INDEPENDENT_AMBULATORY_CARE_PROVIDER_SITE_OTHER): Payer: Self-pay

## 2020-08-31 NOTE — Telephone Encounter (Signed)
Per Dr. Gilda Crease the patient's LLE angio has been canceled on 09/01/20.

## 2020-09-01 ENCOUNTER — Encounter: Admission: RE | Payer: Self-pay | Source: Home / Self Care

## 2020-09-01 ENCOUNTER — Ambulatory Visit: Admission: RE | Admit: 2020-09-01 | Payer: Medicare Other | Source: Home / Self Care | Admitting: Vascular Surgery

## 2020-09-01 DIAGNOSIS — I70219 Atherosclerosis of native arteries of extremities with intermittent claudication, unspecified extremity: Secondary | ICD-10-CM

## 2020-09-01 SURGERY — LOWER EXTREMITY ANGIOGRAPHY
Anesthesia: Moderate Sedation | Site: Leg Lower | Laterality: Left

## 2020-09-07 ENCOUNTER — Other Ambulatory Visit: Payer: Self-pay

## 2020-09-07 ENCOUNTER — Ambulatory Visit (INDEPENDENT_AMBULATORY_CARE_PROVIDER_SITE_OTHER): Payer: Medicare Other | Admitting: Vascular Surgery

## 2020-09-07 ENCOUNTER — Encounter (INDEPENDENT_AMBULATORY_CARE_PROVIDER_SITE_OTHER): Payer: Self-pay | Admitting: Vascular Surgery

## 2020-09-07 DIAGNOSIS — I701 Atherosclerosis of renal artery: Secondary | ICD-10-CM

## 2020-09-07 DIAGNOSIS — E785 Hyperlipidemia, unspecified: Secondary | ICD-10-CM | POA: Insufficient documentation

## 2020-09-07 DIAGNOSIS — E782 Mixed hyperlipidemia: Secondary | ICD-10-CM | POA: Diagnosis not present

## 2020-09-07 DIAGNOSIS — I70213 Atherosclerosis of native arteries of extremities with intermittent claudication, bilateral legs: Secondary | ICD-10-CM | POA: Diagnosis not present

## 2020-09-07 DIAGNOSIS — I70219 Atherosclerosis of native arteries of extremities with intermittent claudication, unspecified extremity: Secondary | ICD-10-CM | POA: Insufficient documentation

## 2020-09-07 DIAGNOSIS — I10 Essential (primary) hypertension: Secondary | ICD-10-CM

## 2020-09-07 NOTE — Progress Notes (Signed)
MRN : [id number]  Corey Olson is a 65 y.o. (1955-07-23) male who presents with chief complaint of No chief complaint on file. Marland Kitchen  History of Present Illness:   The patient returns to the office for followup and review status post angiogram with intervention on 08/25/2020.   Procedure:  Bilateral common and external iliac artery stenting  The patient notes some improvement in the lower extremity symptoms. No interval shortening of the patient's claudication distance, today he is denies rest pain symptoms.  No new ulcers or wounds have occurred since the last visit.  There have been no significant changes to the patient's overall health care.  The patient denies amaurosis fugax or recent TIA symptoms. There are no recent neurological changes noted. The patient denies history of DVT, PE or superficial thrombophlebitis. The patient denies recent episodes of angina or shortness of breath.     Current Meds  Medication Sig  . acetaminophen (TYLENOL) 500 MG tablet Take 500 mg by mouth every 6 (six) hours as needed for moderate pain or mild pain (lEG PAIN).  Marland Kitchen albuterol (VENTOLIN HFA) 108 (90 Base) MCG/ACT inhaler Inhale 2 puffs into the lungs every 6 (six) hours as needed for wheezing or shortness of breath.  . allopurinol (ZYLOPRIM) 300 MG tablet Take 300 mg by mouth daily.  Marland Kitchen amLODipine (NORVASC) 10 MG tablet Take 10 mg by mouth daily.  Marland Kitchen aspirin 81 MG EC tablet Take by mouth.  Marland Kitchen atorvastatin (LIPITOR) 80 MG tablet Take 80 mg by mouth daily.  . budesonide-formoterol (SYMBICORT) 160-4.5 MCG/ACT inhaler Inhale 2 puffs into the lungs daily.  . clopidogrel (PLAVIX) 75 MG tablet Take 1 tablet (75 mg total) by mouth daily.  Marland Kitchen gabapentin (NEURONTIN) 100 MG capsule Take 100 mg by mouth 2 (two) times daily.  Marland Kitchen lisinopril (ZESTRIL) 20 MG tablet Take 20 mg by mouth daily.  . metoprolol tartrate (LOPRESSOR) 50 MG tablet Take 50 mg by mouth daily.    Past Medical History:  Diagnosis Date  .  Alcohol use disorder, mild, abuse   . COPD (chronic obstructive pulmonary disease) (HCC)   . Gout   . Hyperlipidemia   . Hypertension   . Hypothyroid   . Peripheral vascular complication   . Stroke [facility])     Past Surgical History:  Procedure Laterality Date  . LOWER EXTREMITY ANGIOGRAPHY Right 08/25/2020   Procedure: LOWER EXTREMITY ANGIOGRAPHY;  Surgeon: Renford Dills, MD;  Location: [facility] INVASIVE CV LAB;  Service: Cardiovascular;  Laterality: Right;    Social History Social History   Tobacco Use  . Smoking status: Current Every Day Smoker    Packs/day: 1.00    Years: 50.00    Pack years: 50.00  . Smokeless tobacco: Never Used  Substance Use Topics  . Alcohol use: Yes    Comment: social  . Drug use: Never    Family History Family History  Problem Relation Age of Onset  . Heart attack Mother   . Heart Problems Father   . Heart attack Father     No Known Allergies   REVIEW OF SYSTEMS (Negative unless checked)  Constitutional: [] Weight loss  [] Fever  [] Chills Cardiac: [] Chest pain   [] Chest pressure   [] Palpitations   [] Shortness of breath when laying flat   [] Shortness of breath with exertion. Vascular:  [x] Pain in legs with walking   [] Pain in legs at rest  [] History of DVT   [] Phlebitis   [] Swelling in legs   [] Varicose veins   []   Non-healing ulcers Pulmonary:   [] Uses home oxygen   [] Productive cough   [] Hemoptysis   [] Wheeze  [] COPD   [] Asthma Neurologic:  [] Dizziness   [] Seizures   [] History of stroke   [] History of TIA  [] Aphasia   [] Vissual changes   [] Weakness or numbness in arm   [] Weakness or numbness in leg Musculoskeletal:   [] Joint swelling   [] Joint pain   [] Low back pain Hematologic:  [] Easy bruising  [] Easy bleeding   [] Hypercoagulable state   [] Anemic Gastrointestinal:  [] Diarrhea   [] Vomiting  [] Gastroesophageal reflux/heartburn   [] Difficulty swallowing. Genitourinary:  [] Chronic kidney disease   [] Difficult urination  [] Frequent urination    [] Blood in urine Skin:  [] Rashes   [] Ulcers  Psychological:  [] History of anxiety   []  History of major depression.  Physical Examination  Vitals:   09/07/20 1555  BP: 129/77  Pulse: 74  Resp: 16  Weight: 164 lb 9.6 oz (74.7 kg)   Body mass index is 25.78 kg/m. Gen: WD/WN, NAD Head: [place]/AT, No temporalis wasting.  Ear/Nose/Throat: Hearing grossly intact, nares w/o erythema or drainage Eyes: PER, EOMI, sclera nonicteric.  Neck: Supple, no large masses.   Pulmonary:  Good air movement, no audible wheezing bilaterally, no use of accessory muscles.  Cardiac: RRR, no JVD Vascular:  Vessel Right Left  Radial Palpable Palpable  PT Not Palpable Not Palpable  DP Not Palpable Not Palpable  Gastrointestinal: Non-distended. No guarding/no peritoneal signs.  Musculoskeletal: M/S 5/5 throughout.  No deformity or atrophy.  Neurologic: CN 2-12 intact. Symmetrical.  Speech is fluent. Motor exam as listed above. Psychiatric: Judgment intact, Mood & affect appropriate for pt's clinical situation. Dermatologic: No rashes or ulcers noted.  No changes consistent with cellulitis.   CBC No results found for: WBC, HGB, HCT, MCV, PLT  BMET    Component Value Date/Time   BUN 10 08/25/2020 0930   CREATININE 1.14 08/25/2020 0930   GFRNONAA >60 08/25/2020 0930   Estimated Creatinine Clearance: 61.2 mL/min (by C-G formula based on SCr of 1.14 mg/dL).  COAG No results found for: INR, PROTIME  Radiology PERIPHERAL VASCULAR CATHETERIZATION  Result Date: 08/25/2020 See Op Note    Assessment/Plan 1. Atherosclerosis of native artery of both lower extremities with intermittent claudication (HCC) Recommend:  The patient is status post successful angiogram with intervention.  The patient reports that the claudication symptoms and leg pain are improved.   The patient is uncertain as to whether his leg pain is lifestyle limiting at this point in time.  I reviewed the angiogram and intervention with  the patient and I walked through all of the images.  I described the potential for bilateral femoral endarterectomies and then SFA intervention.  We reviewed the risks and benefits as well as alternatives such as bypass grafting.  This was quite a bit of information in the patient wishes to consider this he is not ready to make a decision regarding proceeding with open endarterectomy and revascularization.  No further invasive studies, angiography or surgery at this time, but the patient is considering the possibility of more complete revascularization.  Should he request moving forward then we will need cardiac clearance.  He has seen Dr. in the past at [facility] clinic and I would ask him to evaluate Mr. Maselli prior to surgery.  The patient should continue walking and begin a more formal exercise program.  The patient should continue antiplatelet therapy and aggressive treatment of the lipid abnormalities  Smoking cessation was again  discussed  Patient should undergo noninvasive studies as ordered. The patient will follow up with me after the studies.   - VAS [place] ABI WITH/WO TBI; Future  2. Renal artery stenosis (HCC) Patient's blood pressure was quite good today.  As long as he maintains such good control I do not intend on revascularizing his renal arteries.  3. Essential hypertension Continue antihypertensive medications as already ordered, these medications have been reviewed and there are no changes at this time.   4. Mixed hyperlipidemia Continue statin as ordered and reviewed, no changes at this time     [name], MD  09/07/2020 3:58 PM

## 2020-09-10 ENCOUNTER — Emergency Department: Payer: Medicare Other

## 2020-09-10 ENCOUNTER — Other Ambulatory Visit: Payer: Self-pay

## 2020-09-10 ENCOUNTER — Encounter: Payer: Self-pay | Admitting: Emergency Medicine

## 2020-09-10 DIAGNOSIS — E039 Hypothyroidism, unspecified: Secondary | ICD-10-CM | POA: Diagnosis not present

## 2020-09-10 DIAGNOSIS — I10 Essential (primary) hypertension: Secondary | ICD-10-CM | POA: Diagnosis not present

## 2020-09-10 DIAGNOSIS — J441 Chronic obstructive pulmonary disease with (acute) exacerbation: Secondary | ICD-10-CM | POA: Insufficient documentation

## 2020-09-10 DIAGNOSIS — Z7982 Long term (current) use of aspirin: Secondary | ICD-10-CM | POA: Insufficient documentation

## 2020-09-10 DIAGNOSIS — Z7901 Long term (current) use of anticoagulants: Secondary | ICD-10-CM | POA: Insufficient documentation

## 2020-09-10 DIAGNOSIS — Z79899 Other long term (current) drug therapy: Secondary | ICD-10-CM | POA: Insufficient documentation

## 2020-09-10 DIAGNOSIS — Z7951 Long term (current) use of inhaled steroids: Secondary | ICD-10-CM | POA: Insufficient documentation

## 2020-09-10 DIAGNOSIS — M79604 Pain in right leg: Secondary | ICD-10-CM | POA: Diagnosis present

## 2020-09-10 DIAGNOSIS — M79605 Pain in left leg: Secondary | ICD-10-CM | POA: Diagnosis not present

## 2020-09-10 DIAGNOSIS — I82401 Acute embolism and thrombosis of unspecified deep veins of right lower extremity: Secondary | ICD-10-CM | POA: Insufficient documentation

## 2020-09-10 DIAGNOSIS — F17200 Nicotine dependence, unspecified, uncomplicated: Secondary | ICD-10-CM | POA: Diagnosis not present

## 2020-09-10 LAB — CBC
HCT: 40.3 % (ref 39.0–52.0)
Hemoglobin: 13.2 g/dL (ref 13.0–17.0)
MCH: 29.5 pg (ref 26.0–34.0)
MCHC: 32.8 g/dL (ref 30.0–36.0)
MCV: 90 fL (ref 80.0–100.0)
Platelets: 245 10*3/uL (ref 150–400)
RBC: 4.48 MIL/uL (ref 4.22–5.81)
RDW: 15 % (ref 11.5–15.5)
WBC: 8.6 10*3/uL (ref 4.0–10.5)
nRBC: 0 % (ref 0.0–0.2)

## 2020-09-10 NOTE — ED Triage Notes (Addendum)
Pt arrived via [facility] from home with reports of bilateral leg pain and shortness of breath. Pt reports shortness of breath started today  Pt had angiogram on 4/5 by Dr. Karin Golden bilateral common and external iliac artery stenting.  Audible wheezing heard in triage.   Pt not currently taking any pain medication.  Pt has hx of CVA with R sided deficits.

## 2020-09-11 ENCOUNTER — Emergency Department
Admission: EM | Admit: 2020-09-11 | Discharge: 2020-09-11 | Disposition: A | Discharge status: Home / Self Care | Payer: Medicare Other | Attending: Emergency Medicine | Admitting: Emergency Medicine

## 2020-09-11 ENCOUNTER — Emergency Department: Payer: Medicare Other

## 2020-09-11 DIAGNOSIS — M79604 Pain in right leg: Secondary | ICD-10-CM

## 2020-09-11 DIAGNOSIS — J441 Chronic obstructive pulmonary disease with (acute) exacerbation: Secondary | ICD-10-CM

## 2020-09-11 DIAGNOSIS — M79605 Pain in left leg: Secondary | ICD-10-CM

## 2020-09-11 DIAGNOSIS — I82411 Acute embolism and thrombosis of right femoral vein: Secondary | ICD-10-CM

## 2020-09-11 LAB — BASIC METABOLIC PANEL
Anion gap: 11 (ref 5–15)
BUN: 8 mg/dL (ref 8–23)
CO2: 25 mmol/L (ref 22–32)
Calcium: 9.2 mg/dL (ref 8.9–10.3)
Chloride: 101 mmol/L (ref 98–111)
Creatinine, Ser: 1.05 mg/dL (ref 0.61–1.24)
GFR, Estimated: >60 mL/min (ref >60)
Glucose, Bld: 106 mg/dL — ABNORMAL HIGH (ref 70–99)
Potassium: 3 mmol/L — ABNORMAL LOW (ref 3.5–5.1)
Sodium: 137 mmol/L (ref 135–145)

## 2020-09-11 LAB — TROPONIN I (HIGH SENSITIVITY)
Troponin I (High Sensitivity): 15 ng/L (ref <18)
Troponin I (High Sensitivity): 13 ng/L (ref <18)

## 2020-09-11 MED ORDER — ENOXAPARIN SODIUM 80 MG/0.8ML ~~LOC~~ SOLN
1.0000 mg/kg | Freq: Once | SUBCUTANEOUS | Status: AC
  Administered 2020-09-11: 75 mg via SUBCUTANEOUS
  Filled 2020-09-11: qty 0.8

## 2020-09-11 MED ORDER — IPRATROPIUM-ALBUTEROL 0.5-2.5 (3) MG/3ML IN SOLN
3.0000 mL | Freq: Once | RESPIRATORY_TRACT | Status: AC
  Administered 2020-09-11: 3 mL via RESPIRATORY_TRACT
  Filled 2020-09-11: qty 3

## 2020-09-11 MED ORDER — APIXABAN (ELIQUIS) VTE STARTER PACK (10MG AND 5MG): ORAL_TABLET | ORAL | 0 refills | Status: DC

## 2020-09-11 MED ORDER — PREDNISONE 10 MG (21) PO TBPK: ORAL_TABLET | ORAL | 0 refills | Status: DC

## 2020-09-11 MED ORDER — METHYLPREDNISOLONE SODIUM SUCC 125 MG IJ SOLR
125.0000 mg | Freq: Once | INTRAMUSCULAR | Status: AC
  Administered 2020-09-11: 125 mg via INTRAVENOUS
  Filled 2020-09-11: qty 2

## 2020-09-11 NOTE — ED Notes (Signed)
Ultrasound at bedside

## 2020-09-11 NOTE — ED Notes (Signed)
Audible wheezing noted. MD aware. See orders.

## 2020-09-11 NOTE — ED Provider Notes (Signed)
[facility] Emergency Department Provider Note   ____________________________________________   Event Date/Time   First MD Initiated Contact with Patient 09/11/20 [phone]     (approximate)  I have reviewed the triage vital signs and the nursing notes.   HISTORY  Chief Complaint Leg Pain and Shortness of Breath    HPI Corey Olson is a 65 y.o. male with the below stated past medical history who presents for complaints of bilateral leg pain and shortness of breath that was witnessed today by a family member who is his caretaker and at bedside providing most of this history.  States that patient has been complaining of both of his lower extremities hurting throughout the day and did not get up from his chair for approximately 24 hours per patient's caregiver.  States that patient had an angiogram on 4/5 by Dr. Gilda Crease and had bilateral common and external iliac artery stenting at that time.  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  Denies any medication nonadherence         Past Medical History:  Diagnosis Date  . Alcohol use disorder, mild, abuse   . COPD (chronic obstructive pulmonary disease) (HCC)   . Gout   . Hyperlipidemia   . Hypertension   . Hypothyroid   . Peripheral vascular complication   . Stroke [facility])     Patient Active Problem List   Diagnosis Date Noted  . Atherosclerosis of native arteries of extremity with intermittent claudication (HCC) 09/07/2020  . Renal artery stenosis (HCC) 09/07/2020  . Essential hypertension 09/07/2020  . Hyperlipidemia 09/07/2020  . 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 PAD (peripheral artery disease) (HCC) 07/17/2020  . Primary osteoarthritis of right foot 07/09/2018  . Chronic foot pain, right 06/18/2018  . High risk medication use 06/18/2018  . Personal history of gout 06/18/2018  . Personal history of tobacco use, presenting hazards to health 10/04/2016    Past Surgical  History:  Procedure Laterality Date  . LOWER EXTREMITY ANGIOGRAPHY Right 08/25/2020   Procedure: LOWER EXTREMITY ANGIOGRAPHY;  Surgeon: Renford Dills, MD;  Location: [facility] INVASIVE CV LAB;  Service: Cardiovascular;  Laterality: Right;    Prior to Admission medications   Medication Sig Start Date End Date Taking? Authorizing Provider  APIXABAN Everlene Balls) VTE STARTER PACK (10MG  AND 5MG ) Take as directed on package: start with two-5mg  tablets twice daily for 7 days. On day 8, switch to one-5mg  tablet twice daily. 09/11/20  Yes , MD  predniSONE (STERAPRED UNI-PAK 21 TAB) 10 MG (21) TBPK tablet As directed 09/11/20  Yes Fey Coghill, Merwyn Katos, MD  acetaminophen (TYLENOL) 500 MG tablet Take 500 mg by mouth every 6 (six) hours as needed for moderate pain or mild pain (lEG PAIN).    [provider]  albuterol (VENTOLIN HFA) 108 (90 Base) MCG/ACT inhaler Inhale 2 puffs into the lungs every 6 (six) hours as needed for wheezing or shortness of breath.    [provider]  allopurinol (ZYLOPRIM) 300 MG tablet Take 300 mg by mouth daily.    [provider]  amLODipine (NORVASC) 10 MG tablet Take 10 mg by mouth daily. 01/28/15   [provider]  aspirin 81 MG EC tablet Take by mouth.    [provider]  atorvastatin (LIPITOR) 80 MG tablet Take 80 mg by mouth daily.    [provider]  budesonide-formoterol (SYMBICORT) 160-4.5 MCG/ACT inhaler Inhale 2 puffs into the lungs daily. 01/28/15  [provider]  clopidogrel (PLAVIX) 75 MG tablet Take 1 tablet (75 mg total) by mouth daily. 08/26/20   Schnier, Latina CraverGregory G, MD  gabapentin (NEURONTIN) 100 MG capsule Take 100 mg by mouth 2 (two) times daily.    [provider]  lisinopril (ZESTRIL) 20 MG tablet Take 20 mg by mouth daily. 06/16/20   [provider]  metoprolol tartrate (LOPRESSOR) 50 MG tablet Take 50 mg by mouth daily. 01/28/15   [provider]    Allergies Patient  has no known allergies.  Family History  Problem Relation Age of Onset  . Heart attack Mother   . Heart Problems Father   . Heart attack Father     Social History Social History   Tobacco Use  . Smoking status: Current Every Day Smoker    Packs/day: 1.00    Years: 50.00    Pack years: 50.00  . Smokeless tobacco: Never Used  Substance Use Topics  . Alcohol use: Yes    Comment: social  . Drug use: Never    Review of Systems Unable to assess secondary to mental status ____________________________________________   PHYSICAL EXAM:  VITAL SIGNS: ED Triage Vitals  Enc Vitals Group     BP 09/10/20 2310 (!) 154/68     Pulse Rate 09/10/20 2310 86     Resp 09/10/20 2310 20     Temp 09/10/20 2310 98.4 F (36.9 C)     Temp Source 09/10/20 2310 Oral     SpO2 09/10/20 2310 97 %     Weight 09/10/20 2306 164 lb 9.6 oz (74.7 kg)     Height 09/10/20 2306 5\' 7"  (1.702 m)     Head Circumference --      Peak Flow --      Pain Score 09/10/20 2306 5     Pain Loc --      Pain Edu? --      Excl. in GC? --    Constitutional: Alert and disoriented. Well appearing elderly African-American male in no acute distress. Eyes: Conjunctivae are normal. PERRL. Head: Atraumatic. Nose: No congestion/rhinnorhea. Mouth/Throat: Mucous membranes are moist. Neck: No stridor Cardiovascular: Grossly normal heart sounds.  Good peripheral circulation. Respiratory: Normal respiratory effort.  No retractions. Gastrointestinal: Soft and nontender. No distention. Musculoskeletal: No obvious deformities Neurologic:  Normal speech and language. No gross focal neurologic deficits are appreciated. Skin:  Skin is warm and dry. No rash noted.  +2 edema noted to right lower extremity to the knee Psychiatric: Mood and affect are normal. Speech and behavior are normal.  ____________________________________________   LABS (all labs ordered are listed, but only abnormal results are displayed)  Labs Reviewed   BASIC METABOLIC PANEL - Abnormal; Notable for the following components:      Result Value   Potassium 3.0 (*)    Glucose, Bld 106 (*)    All other components within normal limits  CBC  TROPONIN I (HIGH SENSITIVITY)  TROPONIN I (HIGH SENSITIVITY)   ____________________________________________  EKG  ED ECG REPORT I, Merwyn KatosEvan K Varina Hulon, the attending physician, personally viewed and interpreted this ECG.  Date: 09/11/2020 EKG Time: 2320 Rate: 86 Rhythm: normal sinus rhythm QRS Axis: normal Intervals: normal ST/T Wave abnormalities: normal Narrative Interpretation: no evidence of acute ischemia  ____________________________________________  RADIOLOGY  ED MD interpretation: 2 view chest x-ray shows no evidence of acute abnormalities including no pneumonia, pneumothorax, or widened mediastinum \ Doppler ultrasound of the left lower extremity shows a nonocclusive thrombus in  the right common femoral vein  Official radiology report(s): US Venous Img Lower Unilateral Right  Addendum Date: 09/11/2020   ADDENDUM REPORT: 09/11/2020 04:39 ADDENDUM: These results were called by telephone at the time of interpretation on 09/11/2020 at 4:39 am to provider [facility] , who verbally acknowledged these results. Electronically Signed   By: Kreg Shropshire M.D.   On: 09/11/2020 04:39   Result Date: 09/11/2020 CLINICAL DATA:  Right leg swelling since yesterday EXAM: RIGHT LOWER EXTREMITY VENOUS DOPPLER ULTRASOUND TECHNIQUE: Gray-scale sonography with compression, as well as color and duplex ultrasound, were performed to evaluate the deep venous system(s) from the level of the common femoral vein through the popliteal and proximal calf veins. COMPARISON:  Outside ultrasound 08/25/2020 FINDINGS: VENOUS Partially compressible, nonocclusive thrombus is seen in the right common femoral vein. Otherwise normal compressibility of the superficial femoral, and popliteal veins, as well as the visualized calf veins.  Visualized portions of profunda femoral vein and great saphenous vein unremarkable. No filling defects to suggest DVT on grayscale or color Doppler imaging. Doppler waveforms show normal direction of venous flow, normal respiratory plasticity and response to augmentation. Limited views of the contralateral common femoral vein are unremarkable. OTHER Limited images and absence of comparison imaging report from lower extremity ultrasound 08/25/2020. Limitations: none IMPRESSION: The nonocclusive thrombus seen in the right common femoral vein. No other femoropopliteal DVT or evidence of DVT within the visualized calf veins of the right lower extremity. Electronically Signed: By: Kreg Shropshire M.D. On: 09/11/2020 04:35    ____________________________________________   PROCEDURES  Procedure(s) performed (including Critical Care):  .1-3 Lead EKG Interpretation Performed by: Merwyn Katos, MD Authorized by: Merwyn Katos, MD     Interpretation: normal     ECG rate:  80   ECG rate assessment: normal     Rhythm: sinus rhythm     Ectopy: none     Conduction: normal       ____________________________________________   INITIAL IMPRESSION / ASSESSMENT AND PLAN / ED COURSE  As part of my medical decision making, I reviewed the following data within the electronic MEDICAL RECORD NUMBER Nursing notes reviewed and incorporated, Labs reviewed, EKG interpreted, Old chart reviewed, Radiograph reviewed and Notes from prior ED visits reviewed and incorporated        This 65 year old male presents with leg swelling of unclear etiology, concerning for DVT vs cellulitis. DDX includes chronic venous stasis changes, lymphedema, fracture or trauma, MSK pain, and other nonemergent causes of leg swelling. Doubt atypical presentation of CHF or other volume overload states. PE is low on the differential due to normal vital signs without symptoms. Low suspicion for constitutional infection or metabolic  derangements.  Plan: basic labs, DVT US, consider plain films, reassess, likely discharge Right lower quadrant ultrasound shows a nonocclusive DVT in the common femoral vein Patient given Lovenox prior to discharge as well as a prescription for Eliquis starter pack  Dispo: Discharge home with PCP follow-up      ____________________________________________   FINAL CLINICAL IMPRESSION(S) / ED DIAGNOSES  Final diagnoses:  Acute deep vein thrombosis (DVT) of femoral vein of right lower extremity (HCC)  Pain in both lower extremities  COPD exacerbation [facility])     ED Discharge Orders         Ordered    APIXABAN (ELIQUIS) VTE STARTER PACK (10MG  AND 5MG )        09/11/20 0511    predniSONE (STERAPRED UNI-PAK 21 TAB) 10 MG (21) TBPK tablet  09/11/20 0511           Note:  This document was prepared using Dragon voice recognition software and may include unintentional dictation errors.   Merwyn Katos, MD 09/12/20 [phone]

## 2020-09-11 NOTE — ED Notes (Signed)
ED Provider at bedside. 

## 2020-09-11 NOTE — ED Notes (Signed)
ED Provider at bedside with update. 

## 2020-09-28 ENCOUNTER — Telehealth (INDEPENDENT_AMBULATORY_CARE_PROVIDER_SITE_OTHER): Payer: Self-pay

## 2020-09-28 NOTE — Telephone Encounter (Signed)
Patient sister called to informed that her brother went to ED on 09/11/20 and diagnosis with DVT.Patient sister informed that the patient was not having any pain at this time and would like to postpone his upcoming surgery.Corey Olson

## 2020-10-16 ENCOUNTER — Telehealth (INDEPENDENT_AMBULATORY_CARE_PROVIDER_SITE_OTHER): Payer: Self-pay

## 2020-10-16 NOTE — Telephone Encounter (Signed)
I attempted to contact the patient to schedule him for a bilateral femoral endarterectomy and SFA intervention and a message was left for a return call. Patient's wife called back and confirmed that he was not going to do surgery at this time.

## 2020-12-07 ENCOUNTER — Encounter (INDEPENDENT_AMBULATORY_CARE_PROVIDER_SITE_OTHER): Payer: Medicare Other

## 2020-12-07 ENCOUNTER — Ambulatory Visit (INDEPENDENT_AMBULATORY_CARE_PROVIDER_SITE_OTHER): Payer: Medicare Other | Admitting: Vascular Surgery

## 2020-12-07 ENCOUNTER — Encounter (INDEPENDENT_AMBULATORY_CARE_PROVIDER_SITE_OTHER): Payer: Self-pay | Admitting: Vascular Surgery

## 2020-12-30 ENCOUNTER — Other Ambulatory Visit (INDEPENDENT_AMBULATORY_CARE_PROVIDER_SITE_OTHER): Payer: Self-pay | Admitting: Vascular Surgery

## 2020-12-30 DIAGNOSIS — I739 Peripheral vascular disease, unspecified: Secondary | ICD-10-CM

## 2020-12-30 NOTE — Progress Notes (Deleted)
MRN : [id number]  Corey Olson is a 65 y.o. (Dec 15, 1955) male who presents with chief complaint of leg blockages.  History of Present Illness:     The patient returns to the office for followup and review status post angiogram with intervention on 08/25/2020.    Procedure:  Bilateral common and external iliac artery stenting   The patient notes some improvement in the lower extremity symptoms. No interval shortening of the patient's claudication distance, today he is denies rest pain symptoms.  No new ulcers or wounds have occurred since the last visit.   There have been no significant changes to the patient's overall health care.   The patient denies amaurosis fugax or recent TIA symptoms. There are no recent neurological changes noted. The patient denies history of DVT, PE or superficial thrombophlebitis. The patient denies recent episodes of angina or shortness of breath.   No outpatient medications have been marked as taking for the 12/31/20 encounter (Appointment) with Gilda Crease, Latina Craver, MD.    Past Medical History:  Diagnosis Date   Alcohol use disorder, mild, abuse    COPD (chronic obstructive pulmonary disease) (HCC)    Gout    Hyperlipidemia    Hypertension    Hypothyroid    Peripheral vascular complication    Stroke [facility])     Past Surgical History:  Procedure Laterality Date   LOWER EXTREMITY ANGIOGRAPHY Right 08/25/2020   Procedure: LOWER EXTREMITY ANGIOGRAPHY;  Surgeon: Renford Dills, MD;  Location: [facility] INVASIVE CV LAB;  Service: Cardiovascular;  Laterality: Right;    Social History Social History   Tobacco Use   Smoking status: Every Day    Packs/day: 1.00    Years: 50.00    Pack years: 50.00    Types: Cigarettes   Smokeless tobacco: Never  Substance Use Topics   Alcohol use: Yes    Comment: social   Drug use: Never    Family History Family History  Problem Relation Age of Onset   Heart attack Mother    Heart Problems Father    Heart  attack Father     No Known Allergies   REVIEW OF SYSTEMS (Negative unless checked)  Constitutional: [] Weight loss  [] Fever  [] Chills Cardiac: [] Chest pain   [] Chest pressure   [] Palpitations   [] Shortness of breath when laying flat   [] Shortness of breath with exertion. Vascular:  [x] Pain in legs with walking   [] Pain in legs at rest  [] History of DVT   [] Phlebitis   [] Swelling in legs   [] Varicose veins   [] Non-healing ulcers Pulmonary:   [] Uses home oxygen   [] Productive cough   [] Hemoptysis   [] Wheeze  [] COPD   [] Asthma Neurologic:  [] Dizziness   [] Seizures   [] History of stroke   [] History of TIA  [] Aphasia   [] Vissual changes   [] Weakness or numbness in arm   [] Weakness or numbness in leg Musculoskeletal:   [] Joint swelling   [] Joint pain   [] Low back pain Hematologic:  [] Easy bruising  [] Easy bleeding   [] Hypercoagulable state   [] Anemic Gastrointestinal:  [] Diarrhea   [] Vomiting  [] Gastroesophageal reflux/heartburn   [] Difficulty swallowing. Genitourinary:  [] Chronic kidney disease   [] Difficult urination  [] Frequent urination   [] Blood in urine Skin:  [] Rashes   [] Ulcers  Psychological:  [] History of anxiety   []  History of major depression.  Physical Examination  There were no vitals filed for this visit. There is no height or weight on file to calculate BMI. Gen: WD/WN,  NAD Head: [place]/AT, No temporalis wasting.  Ear/Nose/Throat: Hearing grossly intact, nares w/o erythema or drainage Eyes: PER, EOMI, sclera nonicteric.  Neck: Supple, no masses.  No bruit or JVD.  Pulmonary:  Good air movement, no audible wheezing, no use of accessory muscles.  Cardiac: RRR, normal S1, S2, no Murmurs. Vascular:  *** Vessel Right Left  Radial Palpable Palpable  Carotid Palpable Palpable  PT Palpable Palpable  DP Palpable Palpable  Gastrointestinal: soft, non-distended. No guarding/no peritoneal signs.  Musculoskeletal: M/S 5/5 throughout.  No visible deformity.  Neurologic: CN 2-12 intact.  Pain and light touch intact in extremities.  Symmetrical.  Speech is fluent. Motor exam as listed above. Psychiatric: Judgment intact, Mood & affect appropriate for pt's clinical situation. Dermatologic: No rashes or ulcers noted.  No changes consistent with cellulitis.   CBC Lab Results  Component Value Date   WBC 8.6 09/10/2020   HGB 13.2 09/10/2020   HCT 40.3 09/10/2020   MCV 90.0 09/10/2020   PLT 245 09/10/2020    BMET    Component Value Date/Time   NA 137 09/10/2020 2313   K 3.0 (L) 09/10/2020 2313   CL 101 09/10/2020 2313   CO2 25 09/10/2020 2313   GLUCOSE 106 (H) 09/10/2020 2313   BUN 8 09/10/2020 2313   CREATININE 1.05 09/10/2020 2313   CALCIUM 9.2 09/10/2020 2313   GFRNONAA >60 09/10/2020 2313   CrCl cannot be calculated (Patient's most recent lab result is older than the maximum 21 days allowed.).  COAG No results found for: INR, PROTIME  Radiology No results found.   Assessment/Plan There are no diagnoses linked to this encounter.   [name], MD  12/30/2020 1:13 PM

## 2020-12-31 ENCOUNTER — Encounter (INDEPENDENT_AMBULATORY_CARE_PROVIDER_SITE_OTHER): Payer: Self-pay

## 2020-12-31 ENCOUNTER — Ambulatory Visit (INDEPENDENT_AMBULATORY_CARE_PROVIDER_SITE_OTHER): Payer: Medicare Other

## 2020-12-31 ENCOUNTER — Ambulatory Visit (INDEPENDENT_AMBULATORY_CARE_PROVIDER_SITE_OTHER): Payer: Medicare Other | Admitting: Vascular Surgery

## 2020-12-31 ENCOUNTER — Other Ambulatory Visit: Payer: Self-pay

## 2020-12-31 ENCOUNTER — Other Ambulatory Visit (INDEPENDENT_AMBULATORY_CARE_PROVIDER_SITE_OTHER): Payer: Self-pay | Admitting: Vascular Surgery

## 2020-12-31 ENCOUNTER — Other Ambulatory Visit (INDEPENDENT_AMBULATORY_CARE_PROVIDER_SITE_OTHER): Payer: Medicare Other

## 2020-12-31 DIAGNOSIS — E782 Mixed hyperlipidemia: Secondary | ICD-10-CM

## 2020-12-31 DIAGNOSIS — I739 Peripheral vascular disease, unspecified: Secondary | ICD-10-CM

## 2020-12-31 DIAGNOSIS — I10 Essential (primary) hypertension: Secondary | ICD-10-CM

## 2020-12-31 DIAGNOSIS — I728 Aneurysm of other specified arteries: Secondary | ICD-10-CM

## 2020-12-31 DIAGNOSIS — I70213 Atherosclerosis of native arteries of extremities with intermittent claudication, bilateral legs: Secondary | ICD-10-CM

## 2020-12-31 DIAGNOSIS — I701 Atherosclerosis of renal artery: Secondary | ICD-10-CM

## 2021-01-05 ENCOUNTER — Encounter (INDEPENDENT_AMBULATORY_CARE_PROVIDER_SITE_OTHER): Payer: Self-pay | Admitting: *Deleted

## 2021-01-11 ENCOUNTER — Ambulatory Visit: Payer: Medicare Other | Admitting: Cardiology

## 2021-02-19 ENCOUNTER — Ambulatory Visit: Payer: Medicare Other | Admitting: Cardiology

## 2021-03-08 ENCOUNTER — Encounter: Payer: Self-pay | Admitting: Cardiology

## 2021-03-08 ENCOUNTER — Ambulatory Visit (INDEPENDENT_AMBULATORY_CARE_PROVIDER_SITE_OTHER): Payer: Medicare Other | Admitting: Cardiology

## 2021-03-08 ENCOUNTER — Other Ambulatory Visit: Payer: Self-pay

## 2021-03-08 VITALS — BP 112/60 | HR 77 | Wt 163.0 lb

## 2021-03-08 DIAGNOSIS — I2510 Atherosclerotic heart disease of native coronary artery without angina pectoris: Secondary | ICD-10-CM

## 2021-03-08 DIAGNOSIS — F17200 Nicotine dependence, unspecified, uncomplicated: Secondary | ICD-10-CM

## 2021-03-08 DIAGNOSIS — E7800 Pure hypercholesterolemia, unspecified: Secondary | ICD-10-CM

## 2021-03-08 DIAGNOSIS — I10 Essential (primary) hypertension: Secondary | ICD-10-CM | POA: Diagnosis not present

## 2021-03-08 MED ORDER — METOPROLOL SUCCINATE ER 50 MG PO TB24: 50.0000 mg | ORAL_TABLET | Freq: Every day | ORAL | 3 refills | Status: DC

## 2021-03-08 NOTE — Progress Notes (Signed)
Cardiology Office Note:    Date:  03/08/2021   ID:  Corey Olson, DOB 1955-08-27, MRN [id number]  PCP:  [name], MD  [facility] Cardiologist:  Debbe Odea, MD  [facility] Electrophysiologist:  None   Referring MD: [name], MD   Chief Complaint  Patient presents with   Other    6 month follow up -- Patients wide c/o SOB when smoking a lot.     History of Present Illness:    Corey Olson is a 65 y.o. 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  Previously seen due to coronary artery calcifications.    He still smokes, previous echo showed normal EF, Lexiscan Myoview with no evidence for ischemia.  States being out of breath with exertion which is chronic, unchanged over the past year.  Denies chest pain.  He still smokes.  Has symptoms of claudication, bilateral iliac artery and external iliac artery stents placed.  Claudication symptoms improved.  Takes medications as prescribed,   Prior notes Echo 06/2020 EF 50%. Lexiscan Myoview, no evidence for significant ischemia.  Past Medical History:  Diagnosis Date   Alcohol use disorder, mild, abuse    COPD (chronic obstructive pulmonary disease) (HCC)    Gout    Hyperlipidemia    Hypertension    Hypothyroid    Peripheral vascular complication    Stroke [facility])     Past Surgical History:  Procedure Laterality Date   LOWER EXTREMITY ANGIOGRAPHY Right 08/25/2020   Procedure: LOWER EXTREMITY ANGIOGRAPHY;  Surgeon: Renford Dills, MD;  Location: [facility] INVASIVE CV LAB;  Service: Cardiovascular;  Laterality: Right;    Current Medications: Current Meds  Medication Sig   acetaminophen (TYLENOL) 500 MG tablet Take 500 mg by mouth every 6 (six) hours as needed for moderate pain or mild pain (lEG PAIN).   albuterol (VENTOLIN HFA) 108 (90 Base)  MCG/ACT inhaler Inhale 2 puffs into the lungs every 6 (six) hours as needed for wheezing or shortness of breath.   allopurinol (ZYLOPRIM) 300 MG tablet Take 300 mg by mouth daily.   amLODipine (NORVASC) 10 MG tablet Take 10 mg by mouth daily.   APIXABAN (ELIQUIS) VTE STARTER PACK (10MG  AND 5MG ) Take as directed on package: start with two-5mg  tablets twice daily for 7 days. On day 8, switch to one-5mg  tablet twice daily.   aspirin 81 MG EC tablet Take by mouth.   atorvastatin (LIPITOR) 80 MG tablet Take 80 mg by mouth daily.   budesonide-formoterol (SYMBICORT) 160-4.5 MCG/ACT inhaler Inhale 2 puffs into the lungs daily.   clopidogrel (PLAVIX) 75 MG tablet Take 1 tablet (75 mg total) by mouth daily.   gabapentin (NEURONTIN) 100 MG capsule Take 100 mg by mouth 2 (two) times daily.   lisinopril (ZESTRIL) 20 MG tablet Take 20 mg by mouth daily.   metoprolol succinate (TOPROL-XL) 50 MG 24 hr tablet Take 1 tablet (50 mg total) by mouth daily. Take with or immediately following a meal.   [DISCONTINUED] metoprolol tartrate (LOPRESSOR) 50 MG tablet Take 50 mg by mouth daily.     Allergies:   Patient has no known allergies.   Social History   Socioeconomic History   Marital status: Single    Spouse name: Not on file   Number of children: Not on file   Years of education: Not on file  Highest education level: Not on file  Occupational History   Not on file  Tobacco Use   Smoking status: Every Day    Packs/day: 1.00    Years: 50.00    Pack years: 50.00    Types: Cigarettes   Smokeless tobacco: Never  Substance and Sexual Activity   Alcohol use: Yes    Comment: social   Drug use: Never   Sexual activity: Not on file  Other Topics Concern   Not on file  Social History Narrative   Not on file   Social Determinants of Health   Financial Resource Strain: Not on file  Food Insecurity: Not on file  Transportation Needs: Not on file  Physical Activity: Not on file  Stress: Not on file   Social Connections: Not on file     Family History: The patient's family history includes Heart Problems in his father; Heart attack in his father and mother.  ROS:   Please see the history of present illness.     All other systems reviewed and are negative.  EKGs/Labs/Other Studies Reviewed:    The following studies were reviewed today:   EKG:  EKG is ordered today.  EKG shows normal sinus rhythm, first-degree AV block.  Recent Labs: 09/10/2020: BUN 8; Creatinine, Ser 1.05; Hemoglobin 13.2; Platelets 245; Potassium 3.0; Sodium 137  Recent Lipid Panel No results found for: CHOL, TRIG, HDL, CHOLHDL, VLDL, LDLCALC, LDLDIRECT   Risk Assessment/Calculations:      Physical Exam:    VS:  BP 112/60 (BP Location: Left Arm, Patient Position: Sitting, Cuff Size: Normal)   Pulse 77   Wt 163 lb (73.9 kg)   SpO2 96%   BMI 25.53 kg/m     Wt Readings from Last 3 Encounters:  03/08/21 163 lb (73.9 kg)  09/10/20 164 lb 9.6 oz (74.7 kg)  09/07/20 164 lb 9.6 oz (74.7 kg)     GEN:  Well nourished, well developed in no acute distress HEENT: Normal NECK: No JVD; No carotid bruits LYMPHATICS: No lymphadenopathy CARDIAC: Distant heart sounds, no murmurs RESPIRATORY: Diminished breath sounds, rhonchorous breath sounds bilaterally ABDOMEN: Soft, non-tender, non-distended MUSCULOSKELETAL:  No edema; right arm and leg weakness SKIN: Warm and dry NEUROLOGIC:  Alert and oriented x 3, right-sided weakness PSYCHIATRIC:  Normal affect   ASSESSMENT:    1. Coronary artery disease involving native coronary artery of native heart without angina pectoris   2. Primary hypertension   3. Smoking   4. Pure hypercholesterolemia     PLAN:    In order of problems listed above:  Multivessel coronary artery calcification on chest CT.  Denies chest pain.  Echo with preserved EF 50%.  Lexiscan Myoview with no ischemia.  Continue aspirin, Lipitor.  Patient is high risk, if he develops chest pain  with exertion or dyspnea, will schedule left heart cath.  He otherwise has stable disease.  Stop Lopressor, start Toprol-XL 50 mg daily. Hypertension, BP controlled.  Toprol-XL as above, continue lisinopril, amlodipine. Current smoker, cessation advised. Hyperlipidemia, high intensity statin, Lipitor 80 mg daily.  Last lipid panel showed LDL at goal.  Follow-up in 6 months   Medication Adjustments/Labs and Tests Ordered: Current medicines are reviewed at length with the patient today.  Concerns regarding medicines are outlined above.  Orders Placed This Encounter  Procedures   EKG 12-Lead    Meds ordered this encounter  Medications   metoprolol succinate (TOPROL-XL) 50 MG 24 hr tablet    Sig: Take 1 tablet (  50 mg total) by mouth daily. Take with or immediately following a meal.    Dispense:  90 tablet    Refill:  3     Patient Instructions  Medication Instructions:   Your physician has recommended you make the following change in your medication:    STOP taking your Metoprolol Tartrate (Lopressor).  2.    START taking Metoprolol Succinate (Toprol XL) 50 MG once a day.  *If you need a refill on your cardiac medications before your next appointment, please call your pharmacy*   Lab Work: None ordered If you have labs (blood work) drawn today and your tests are completely normal, you will receive your results only by: MyChart Message (if you have MyChart) OR A paper copy in the mail If you have any lab test that is abnormal or we need to change your treatment, we will call you to review the results.   Testing/Procedures: None ordered   Follow-Up: At [facility], you and your health needs are our priority.  As part of our continuing mission to provide you with exceptional heart care, we have created designated Provider Care Teams.  These Care Teams include your primary Cardiologist (physician) and Advanced Practice Providers (APPs -  Physician Assistants and Nurse  Practitioners) who all work together to provide you with the care you need, when you need it.  We recommend signing up for the patient portal called "MyChart".  Sign up information is provided on this After Visit Summary.  MyChart is used to connect with patients for Virtual Visits (Telemedicine).  Patients are able to view lab/test results, encounter notes, upcoming appointments, etc.  Non-urgent messages can be sent to your provider as well.   To learn more about what you can do with MyChart, go to [email or web address].    Your next appointment:   6 month(s)  The format for your next appointment:   In Person  Provider:   You may see Debbe Odea, MD or one of the following Advanced Practice Providers on your designated Care Team:   Nicolasa Ducking, NP Eula Listen, PA-C Marisue Ivan, PA-C Cadence [place], [place]   Other Instructions    Signed, Debbe Odea, MD  03/08/2021 5:07 PM    [facility]

## 2021-03-08 NOTE — Patient Instructions (Signed)
Medication Instructions:   Your physician has recommended you make the following change in your medication:    STOP taking your Metoprolol Tartrate (Lopressor).  2.    START taking Metoprolol Succinate (Toprol XL) 50 MG once a day.  *If you need a refill on your cardiac medications before your next appointment, please call your pharmacy*   Lab Work: None ordered If you have labs (blood work) drawn today and your tests are completely normal, you will receive your results only by: MyChart Message (if you have MyChart) OR A paper copy in the mail If you have any lab test that is abnormal or we need to change your treatment, we will call you to review the results.   Testing/Procedures: None ordered   Follow-Up: At [facility], you and your health needs are our priority.  As part of our continuing mission to provide you with exceptional heart care, we have created designated Provider Care Teams.  These Care Teams include your primary Cardiologist (physician) and Advanced Practice Providers (APPs -  Physician Assistants and Nurse Practitioners) who all work together to provide you with the care you need, when you need it.  We recommend signing up for the patient portal called "MyChart".  Sign up information is provided on this After Visit Summary.  MyChart is used to connect with patients for Virtual Visits (Telemedicine).  Patients are able to view lab/test results, encounter notes, upcoming appointments, etc.  Non-urgent messages can be sent to your provider as well.   To learn more about what you can do with MyChart, go to [email or web address].    Your next appointment:   6 month(s)  The format for your next appointment:   In Person  Provider:   You may see Debbe Odea, MD or one of the following Advanced Practice Providers on your designated Care Team:   Nicolasa Ducking, NP Eula Listen, PA-C Marisue Ivan, PA-C Cadence [place], [place]   Other Instructions

## 2021-04-12 ENCOUNTER — Encounter (INDEPENDENT_AMBULATORY_CARE_PROVIDER_SITE_OTHER): Payer: Medicare Other

## 2021-04-12 ENCOUNTER — Ambulatory Visit (INDEPENDENT_AMBULATORY_CARE_PROVIDER_SITE_OTHER): Payer: Medicare Other | Admitting: Vascular Surgery

## 2021-05-11 ENCOUNTER — Other Ambulatory Visit (INDEPENDENT_AMBULATORY_CARE_PROVIDER_SITE_OTHER): Payer: Self-pay | Admitting: Vascular Surgery

## 2021-05-11 DIAGNOSIS — I739 Peripheral vascular disease, unspecified: Secondary | ICD-10-CM

## 2021-05-13 ENCOUNTER — Ambulatory Visit (INDEPENDENT_AMBULATORY_CARE_PROVIDER_SITE_OTHER): Payer: Medicare Other

## 2021-05-13 ENCOUNTER — Other Ambulatory Visit: Payer: Self-pay

## 2021-05-13 ENCOUNTER — Ambulatory Visit (INDEPENDENT_AMBULATORY_CARE_PROVIDER_SITE_OTHER): Payer: Medicare Other | Admitting: Nurse Practitioner

## 2021-05-13 VITALS — BP 138/70 | HR 71 | Ht 67.0 in | Wt 162.0 lb

## 2021-05-13 DIAGNOSIS — E782 Mixed hyperlipidemia: Secondary | ICD-10-CM

## 2021-05-13 DIAGNOSIS — Z9889 Other specified postprocedural states: Secondary | ICD-10-CM

## 2021-05-13 DIAGNOSIS — I10 Essential (primary) hypertension: Secondary | ICD-10-CM | POA: Diagnosis not present

## 2021-05-13 DIAGNOSIS — I739 Peripheral vascular disease, unspecified: Secondary | ICD-10-CM

## 2021-05-13 DIAGNOSIS — I70213 Atherosclerosis of native arteries of extremities with intermittent claudication, bilateral legs: Secondary | ICD-10-CM

## 2021-05-13 DIAGNOSIS — Z87891 Personal history of nicotine dependence: Secondary | ICD-10-CM

## 2021-05-13 DIAGNOSIS — I701 Atherosclerosis of renal artery: Secondary | ICD-10-CM

## 2021-05-15 ENCOUNTER — Encounter (INDEPENDENT_AMBULATORY_CARE_PROVIDER_SITE_OTHER): Payer: Self-pay | Admitting: Nurse Practitioner

## 2021-05-15 NOTE — Progress Notes (Signed)
Subjective:    Patient ID: Corey Olson, male    DOB: [birth year], 65 y.o.   MRN: [id number] Chief Complaint  Patient presents with   Follow-up    3 Mo FU    The patient returns to the office for followup and review of the noninvasive studies. There has been a  deterioration in the lower extremity symptoms.  The patient notes interval shortening of their claudication distance symptoms. No new ulcers or wounds have occurred since the last visit.  There have been no significant changes to the patient's overall health care.  The patient denies amaurosis fugax or recent TIA symptoms. There are no recent neurological changes noted. The patient denies history of DVT, PE or superficial thrombophlebitis. The patient denies recent episodes of angina or shortness of breath.   ABI's Rt=0.52 and Lt=0.59 (previous ABI's Rt=0.90 and Lt=0.93) Duplex US of the lower extremity arterial system shows the patient had an aortoiliac duplex which also shows elevated velocities in the left external iliac artery.  The bilateral tibial arteries have monophasic waveforms with dampened toe waveforms in the right and left.   Review of Systems     Objective:   Physical Exam  BP 138/70    Pulse 71    Ht 5\' 7"  (1.702 m)    Wt 162 lb (73.5 kg)    BMI 25.37 kg/m   Past Medical History:  Diagnosis Date   Alcohol use disorder, mild, abuse    COPD (chronic obstructive pulmonary disease) (HCC)    Gout    Hyperlipidemia    Hypertension    Hypothyroid    Peripheral vascular complication    Stroke [facility])     Social History   Socioeconomic History   Marital status: Single    Spouse name: Not on file   Number of children: Not on file   Years of education: Not on file   Highest education level: Not on file  Occupational History   Not on file  Tobacco Use   Smoking status: Every Day    Packs/day: 1.00    Years: 50.00    Pack years: 50.00    Types: Cigarettes   Smokeless tobacco: Never  Substance and  Sexual Activity   Alcohol use: Yes    Comment: social   Drug use: Never   Sexual activity: Not on file  Other Topics Concern   Not on file  Social History Narrative   Not on file   Social Determinants of Health   Financial Resource Strain: Not on file  Food Insecurity: Not on file  Transportation Needs: Not on file  Physical Activity: Not on file  Stress: Not on file  Social Connections: Not on file  Intimate Partner Violence: Not on file    Past Surgical History:  Procedure Laterality Date   LOWER EXTREMITY ANGIOGRAPHY Right 08/25/2020   Procedure: LOWER EXTREMITY ANGIOGRAPHY;  Surgeon: Katha Cabal, MD;  Location: [place] CV LAB;  Service: Cardiovascular;  Laterality: Right;    Family History  Problem Relation Age of Onset   Heart attack Mother    Heart Problems Father    Heart attack Father     No Known Allergies  CBC Latest Ref Rng & Units 09/10/2020  WBC 4.0 - 10.5 K/uL 8.6  Hemoglobin 13.0 - 17.0 g/dL 13.2  Hematocrit 39.0 - 52.0 % 40.3  Platelets 150 - 400 K/uL 245      CMP     Component Value Date/Time   NA  137 09/10/2020 2313   K 3.0 (L) 09/10/2020 2313   CL 101 09/10/2020 2313   CO2 25 09/10/2020 2313   GLUCOSE 106 (H) 09/10/2020 2313   BUN 8 09/10/2020 2313   CREATININE 1.05 09/10/2020 2313   CALCIUM 9.2 09/10/2020 2313   GFRNONAA >60 09/10/2020 2313     No results found.     Assessment & Plan:   1. Atherosclerosis of native artery of both lower extremities with intermittent claudication (HCC) Recommend:  The patient has experienced increased symptoms and is now describing lifestyle limiting claudication and mild rest pain.   Given the severity of the patient's lower extremity symptoms the patient should undergo angiography and intervention.  Risk and benefits were reviewed the patient.  Indications for the procedure were reviewed.  All questions were answered, the patient agrees to proceed.   The patient should continue  walking and begin a more formal exercise program.  The patient should continue antiplatelet therapy and aggressive treatment of the lipid abnormalities  The patient will follow up with me after the angiogram.    2. Essential hypertension Continue antihypertensive medications as already ordered, these medications have been reviewed and there are no changes at this time.   3. Mixed hyperlipidemia Continue statin as ordered and reviewed, no changes at this time   4. Personal history of tobacco use, presenting hazards to health Smoking cessation was discussed, 3-10 minutes spent on this topic specifically    Current Outpatient Medications on File Prior to Visit  Medication Sig Dispense Refill   acetaminophen (TYLENOL) 500 MG tablet Take 500 mg by mouth every 6 (six) hours as needed for moderate pain or mild pain (lEG PAIN).     albuterol (VENTOLIN HFA) 108 (90 Base) MCG/ACT inhaler Inhale 2 puffs into the lungs every 6 (six) hours as needed for wheezing or shortness of breath.     allopurinol (ZYLOPRIM) 300 MG tablet Take 300 mg by mouth daily.     amLODipine (NORVASC) 10 MG tablet Take 10 mg by mouth daily.     APIXABAN (ELIQUIS) VTE STARTER PACK (10MG  AND 5MG ) Take as directed on package: start with two-5mg  tablets twice daily for 7 days. On day 8, switch to one-5mg  tablet twice daily. 1 each 0   aspirin 81 MG EC tablet Take by mouth.     atorvastatin (LIPITOR) 80 MG tablet Take 80 mg by mouth daily.     budesonide-formoterol (SYMBICORT) 160-4.5 MCG/ACT inhaler Inhale 2 puffs into the lungs daily.     clopidogrel (PLAVIX) 75 MG tablet Take 1 tablet (75 mg total) by mouth daily. 30 tablet 5   gabapentin (NEURONTIN) 100 MG capsule Take 100 mg by mouth 2 (two) times daily.     lisinopril (ZESTRIL) 20 MG tablet Take 20 mg by mouth daily.     metoprolol succinate (TOPROL-XL) 50 MG 24 hr tablet Take 1 tablet (50 mg total) by mouth daily. Take with or immediately following a meal. 90 tablet 3    No current facility-administered medications on file prior to visit.    There are no Patient Instructions on file for this visit. No follow-ups on file.   , NP

## 2021-05-27 ENCOUNTER — Telehealth (INDEPENDENT_AMBULATORY_CARE_PROVIDER_SITE_OTHER): Payer: Self-pay

## 2021-05-27 NOTE — Telephone Encounter (Signed)
I attempted to contact the patient to schedule him for a LLE angio with Dr. Schnier. A message was left for a return call. 

## 2021-05-28 NOTE — Telephone Encounter (Signed)
Patient's sister returned the call to schedule the patient for a LLE angio with Dr. Gilda Crease. Patient is on for 06/15/21 with a 6:45 am arrival time to the [facility]. Pre-procedure instructions were discussed and will be mailed. 06/08/21 appt was declined.

## 2021-06-14 ENCOUNTER — Other Ambulatory Visit (INDEPENDENT_AMBULATORY_CARE_PROVIDER_SITE_OTHER): Payer: Self-pay | Admitting: Nurse Practitioner

## 2021-06-14 DIAGNOSIS — I70213 Atherosclerosis of native arteries of extremities with intermittent claudication, bilateral legs: Secondary | ICD-10-CM

## 2021-06-15 ENCOUNTER — Ambulatory Visit
Admission: RE | Admit: 2021-06-15 | Discharge: 2021-06-15 | Disposition: A | Discharge status: Home / Self Care | Payer: Medicare Other | Attending: Vascular Surgery | Admitting: Vascular Surgery

## 2021-06-15 ENCOUNTER — Encounter: Payer: Self-pay | Admitting: Vascular Surgery

## 2021-06-15 ENCOUNTER — Encounter
Admission: RE | Disposition: A | Discharge status: Home / Self Care | Payer: Self-pay | Source: Home / Self Care | Attending: Vascular Surgery

## 2021-06-15 ENCOUNTER — Other Ambulatory Visit: Payer: Self-pay

## 2021-06-15 DIAGNOSIS — I70223 Atherosclerosis of native arteries of extremities with rest pain, bilateral legs: Secondary | ICD-10-CM | POA: Insufficient documentation

## 2021-06-15 DIAGNOSIS — I70213 Atherosclerosis of native arteries of extremities with intermittent claudication, bilateral legs: Secondary | ICD-10-CM

## 2021-06-15 DIAGNOSIS — I771 Stricture of artery: Secondary | ICD-10-CM | POA: Insufficient documentation

## 2021-06-15 DIAGNOSIS — F17210 Nicotine dependence, cigarettes, uncomplicated: Secondary | ICD-10-CM | POA: Insufficient documentation

## 2021-06-15 DIAGNOSIS — Z95828 Presence of other vascular implants and grafts: Secondary | ICD-10-CM | POA: Diagnosis not present

## 2021-06-15 DIAGNOSIS — I70219 Atherosclerosis of native arteries of extremities with intermittent claudication, unspecified extremity: Secondary | ICD-10-CM

## 2021-06-15 HISTORY — PX: LOWER EXTREMITY ANGIOGRAPHY: CATH118251

## 2021-06-15 LAB — BUN: BUN: 8 mg/dL (ref 8–23)

## 2021-06-15 LAB — CREATININE, SERUM
Creatinine, Ser: 1.02 mg/dL (ref 0.61–1.24)
GFR, Estimated: >60 mL/min (ref >60)

## 2021-06-15 SURGERY — LOWER EXTREMITY ANGIOGRAPHY
Anesthesia: Moderate Sedation | Site: Leg Lower | Laterality: Left

## 2021-06-15 MED ORDER — LABETALOL HCL 5 MG/ML IV SOLN: 10.0000 mg | INTRAVENOUS | Status: DC | PRN

## 2021-06-15 MED ORDER — MIDAZOLAM HCL 2 MG/2ML IJ SOLN
INTRAMUSCULAR | Status: DC | PRN
  Administered 2021-06-15: 1 mg via INTRAVENOUS

## 2021-06-15 MED ORDER — ONDANSETRON HCL 4 MG/2ML IJ SOLN: 4.0000 mg | Freq: Four times a day (QID) | INTRAMUSCULAR | Status: DC | PRN

## 2021-06-15 MED ORDER — SODIUM CHLORIDE 0.9% FLUSH: 3.0000 mL | INTRAVENOUS | Status: DC | PRN

## 2021-06-15 MED ORDER — IPRATROPIUM-ALBUTEROL 0.5-2.5 (3) MG/3ML IN SOLN
RESPIRATORY_TRACT | Status: AC
  Administered 2021-06-15: 08:00:00 3 mL via RESPIRATORY_TRACT
  Filled 2021-06-15: qty 3

## 2021-06-15 MED ORDER — HYDROMORPHONE HCL 1 MG/ML IJ SOLN: 1.0000 mg | Freq: Once | INTRAMUSCULAR | Status: DC | PRN

## 2021-06-15 MED ORDER — IPRATROPIUM-ALBUTEROL 0.5-2.5 (3) MG/3ML IN SOLN: 3.0000 mL | RESPIRATORY_TRACT | Status: AC

## 2021-06-15 MED ORDER — SODIUM CHLORIDE 0.9 % IV SOLN: INTRAVENOUS | Status: DC

## 2021-06-15 MED ORDER — FAMOTIDINE 20 MG PO TABS: 40.0000 mg | ORAL_TABLET | Freq: Once | ORAL | Status: DC | PRN

## 2021-06-15 MED ORDER — MIDAZOLAM HCL 2 MG/ML PO SYRP: 8.0000 mg | ORAL_SOLUTION | Freq: Once | ORAL | Status: DC | PRN

## 2021-06-15 MED ORDER — MORPHINE SULFATE (PF) 4 MG/ML IV SOLN: 2.0000 mg | INTRAVENOUS | Status: DC | PRN

## 2021-06-15 MED ORDER — HEPARIN SODIUM (PORCINE) 1000 UNIT/ML IJ SOLN
INTRAMUSCULAR | Status: AC
  Filled 2021-06-15: qty 10

## 2021-06-15 MED ORDER — MIDAZOLAM HCL 2 MG/2ML IJ SOLN
INTRAMUSCULAR | Status: AC
  Filled 2021-06-15: qty 2

## 2021-06-15 MED ORDER — METHYLPREDNISOLONE SODIUM SUCC 125 MG IJ SOLR: 125.0000 mg | Freq: Once | INTRAMUSCULAR | Status: DC | PRN

## 2021-06-15 MED ORDER — CEFAZOLIN SODIUM-DEXTROSE 1-4 GM/50ML-% IV SOLN
INTRAVENOUS | Status: DC | PRN
  Administered 2021-06-15: 2 g via INTRAVENOUS

## 2021-06-15 MED ORDER — SODIUM CHLORIDE 0.9% FLUSH: 3.0000 mL | Freq: Two times a day (BID) | INTRAVENOUS | Status: DC

## 2021-06-15 MED ORDER — CEFAZOLIN SODIUM-DEXTROSE 2-4 GM/100ML-% IV SOLN
INTRAVENOUS | Status: AC
  Filled 2021-06-15: qty 100

## 2021-06-15 MED ORDER — SODIUM CHLORIDE 0.9 % IV SOLN: 250.0000 mL | INTRAVENOUS | Status: DC | PRN

## 2021-06-15 MED ORDER — ACETAMINOPHEN 325 MG PO TABS: 650.0000 mg | ORAL_TABLET | ORAL | Status: DC | PRN

## 2021-06-15 MED ORDER — FENTANYL CITRATE (PF) 100 MCG/2ML IJ SOLN
INTRAMUSCULAR | Status: AC
  Filled 2021-06-15: qty 2

## 2021-06-15 MED ORDER — CEFAZOLIN SODIUM-DEXTROSE 2-4 GM/100ML-% IV SOLN: 2.0000 g | Freq: Once | INTRAVENOUS | Status: DC

## 2021-06-15 MED ORDER — HYDRALAZINE HCL 20 MG/ML IJ SOLN: 5.0000 mg | INTRAMUSCULAR | Status: DC | PRN

## 2021-06-15 MED ORDER — FENTANYL CITRATE (PF) 100 MCG/2ML IJ SOLN
INTRAMUSCULAR | Status: DC | PRN
  Administered 2021-06-15: 50 ug via INTRAVENOUS

## 2021-06-15 MED ORDER — DIPHENHYDRAMINE HCL 50 MG/ML IJ SOLN: 50.0000 mg | Freq: Once | INTRAMUSCULAR | Status: DC | PRN

## 2021-06-15 MED ORDER — OXYCODONE HCL 5 MG PO TABS: 5.0000 mg | ORAL_TABLET | ORAL | Status: DC | PRN

## 2021-06-15 SURGICAL SUPPLY — 10 items
CANNULA 5F STIFF (CANNULA) ×2 IMPLANT
CATH ANGIO 5F PIGTAIL 65CM (CATHETERS) ×2 IMPLANT
COVER PROBE U/S 5X48 (MISCELLANEOUS) ×2 IMPLANT
DEVICE STARCLOSE SE CLOSURE (Vascular Products) ×2 IMPLANT
GLIDEWIRE ADV .035X260CM (WIRE) ×2 IMPLANT
PACK ANGIOGRAPHY (CUSTOM PROCEDURE TRAY) ×3 IMPLANT
SHEATH BRITE TIP 5FRX11 (SHEATH) ×2 IMPLANT
SYR MEDRAD MARK 7 150ML (SYRINGE) ×2 IMPLANT
TUBING CONTRAST HIGH PRESS 72 (TUBING) ×2 IMPLANT
WIRE GUIDERIGHT .035X150 (WIRE) ×2 IMPLANT

## 2021-06-15 NOTE — Op Note (Addendum)
[facility]  Percutaneous Study/Intervention Procedural Note   Date of Surgery: 06/15/2021,9:07 AM  Surgeon:Demitrius Crass, Latina Craver   Pre-operative Diagnosis: Atherosclerotic occlusive disease bilateral lower extremities with rest pain  Post-operative diagnosis:  Same  Procedure(s) Performed:  1.  Abdominal aortogram  2.  Left lower extremity distal runoff third order catheter placement  3.  Bilateral lower extremity arteriography  4.  Ultrasound-guided access to right common femoral artery  5.  StarClose right common femoral artery    Anesthesia: Conscious sedation was administered by the interventional radiology RN under my direct supervision. IV Versed plus fentanyl were utilized. Continuous ECG, pulse oximetry and blood pressure was monitored throughout the entire procedure.  Conscious sedation was administered for a total of 23 minutes.  Sheath: 5 French 11 cm Pinnacle right common femoral retrograde  Contrast: 80 cc   Fluoroscopy Time: 3.2 minutes  Indications:  The patient presents to [facility] with worsening atherosclerotic changes and rest pain to the right and left feet.  Pedal pulses are nonpalpable bilaterally suggesting atherosclerotic occlusive disease.  The risks and benefits as well as alternative therapies for lower extremity revascularization are reviewed with the patient all questions are answered the patient agrees to proceed.  The patient is therefore undergoing angiography with the hope for intervention for limb salvage.   Procedure:  Jacai Kipp a 66 y.o. male who was identified and appropriate procedural time out was performed.  The patient was then placed supine on the table and prepped and draped in the usual sterile fashion.  Ultrasound was used to evaluate the right common femoral artery.  It was echolucent and pulsatile indicating it is patent .  An ultrasound image was acquired for the permanent record.  A micropuncture needle  was used to access the right common femoral artery under direct ultrasound guidance.  The microwire was then advanced under fluoroscopic guidance without difficulty followed by the micro-sheath.  A 0.035 J wire was advanced without resistance and a 5Fr sheath was placed.    Pigtail catheter was then advanced to the level of T12 and AP projection of the aorta was obtained. Pigtail catheter was then repositioned to above the bifurcation and RAO view of the pelvis was obtained. Stiff angled Glidewire and pigtail catheter was then used across the bifurcation and the catheter was positioned in the distal external iliac artery.  LAO view of the left groin was then obtained. Wire was reintroduced and negotiated into the SFA and the catheter was advanced into the SFA. Distal runoff was then performed.  Next, I performed hand-injection through the right femoral sheath to obtain distal runoff of the right lower extremity  After review of the images the catheter was removed over wire and an RAO view of the groin was obtained. StarClose device was deployed without difficulty.   Findings:   Aortogram: Abdominal aorta is opacified with a bolus injection contrast.  Again noted is the bilateral renal artery stenosis the right renal artery appears to have a 60% ostial stenosis the left renal artery appears to have a greater than 90% ostial stenosis.  Nephrograms still appear normal.  The abdominal aorta is diffusely diseased but there are no hemodynamically significant stenoses.  The aortic bifurcation remains patent.  The left common iliac artery stent which begins just at the ostia remains widely patent there is no hemodynamically significant in-stent restenosis.  The right common iliac is diffusely diseased but patent at its origin.  More distal right common iliac artery  stent is widely patent.  Bilateral external iliac artery stents are widely patent.  Right Lower Extremity: The right common femoral is profoundly  diseased with greater than 90% stenoses in tandem lesions mid and distal.  The origin of the profunda femoris appears to be occluded.  There are numerous collaterals that appear to reconstitute the profunda in the mid thigh.  The superficial femoral artery is patent in its proximal one third but by mid thigh occludes the superficial femoral artery from its mid level through the entire length of the popliteal and the trifurcation is occluded.  There is reconstitution of the anterior tibial for short segment and then it occludes again and then the anterior tibial reconstitutes in its proximal one third and is actually patent down to the ankle filling the dorsalis pedis and the pedal arch.  This would appear to be a viable target for bypass.  Tibioperoneal trunk peroneal and posterior tibial are all occluded throughout their course.  Left Lower Extremity: The left common femoral demonstrates diffuse disease throughout its entire length of 50 to 60%.  Ostia of the profunda femoris demonstrates a 60% lesion.  Distally the profunda femoris is patent with numerous collaterals.  The left superficial femoral artery is flush occluded there is no evidence of reconstitution of the superficial femoral artery.  At the mid knee level the popliteal is reconstituted but then occludes again.  The trifurcation is occluded.  Tibioperoneal trunk appears occluded there is reconstitution of the posterior tibial in its proximal one third and this remains patent down to the foot filling the plantar arteries and the pedal arch.  Peroneal is patent but diffusely diseased throughout its course.  Anterior tibial is nonvisualized throughout its entire length.  Posterior tibial does appear to be a potential bypass target.  Summary: The patient's aortoiliac inflow is patent previously placed stents remain free of hemodynamically significant stenosis.  On the right the common femoral has profound disease and would require endarterectomy with  potential femoral to anterior tibial bypass grafting if saphenous vein is available.  On the left the common femoral is severely diseased and would require endarterectomy with potential for a femoral to posterior tibial bypass graft if saphenous vein is available.   Disposition: Patient was taken to the recovery room in stable condition having tolerated the procedure well.  Earl Lites Elridge Stemm 06/15/2021,9:07 AM

## 2021-06-15 NOTE — Progress Notes (Signed)
MRN : [id number]  Corey Olson is a 66 y.o. (09/16/1955) male who presents with chief complaint of leg pain.  History of Present Illness:  The patient returns to the office for followup and review status post angiogram with intervention on 08/25/2020.  He was recently in the office complaining of increasing pain in both lower extremities and is now describing some mild rest pain.   Past Procedure 08/25/2020:  Bilateral common and external iliac artery stenting   No new ulcers or wounds have occurred since the last visit.   There have been no significant changes to the patient's overall health care.   The patient denies amaurosis fugax or recent TIA symptoms. There are no recent neurological changes noted. The patient denies history of DVT, PE or superficial thrombophlebitis. The patient denies recent episodes of angina or shortness of breath.   Current Meds  Medication Sig   albuterol (VENTOLIN HFA) 108 (90 Base) MCG/ACT inhaler Inhale 2 puffs into the lungs every 6 (six) hours as needed for wheezing or shortness of breath.   allopurinol (ZYLOPRIM) 300 MG tablet Take 300 mg by mouth daily.   amLODipine (NORVASC) 10 MG tablet Take 10 mg by mouth daily.   aspirin 81 MG EC tablet Take 81 mg by mouth daily.   atorvastatin (LIPITOR) 80 MG tablet Take 80 mg by mouth daily.   budesonide-formoterol (SYMBICORT) 160-4.5 MCG/ACT inhaler Inhale 2 puffs into the lungs daily.   gabapentin (NEURONTIN) 100 MG capsule Take 100 mg by mouth 2 (two) times daily.   lisinopril (ZESTRIL) 20 MG tablet Take 20 mg by mouth daily.    Past Medical History:  Diagnosis Date   Alcohol use disorder, mild, abuse    COPD (chronic obstructive pulmonary disease) (HCC)    Gout    Hyperlipidemia    Hypertension    Hypothyroid    Peripheral vascular complication    Stroke [facility](HCC)     Past Surgical History:  Procedure Laterality Date   LOWER EXTREMITY ANGIOGRAPHY Right 08/25/2020   Procedure: LOWER EXTREMITY  ANGIOGRAPHY;  Surgeon: Renford DillsSchnier, Ailin Rochford G, MD;  Location: [facility] INVASIVE CV LAB;  Service: Cardiovascular;  Laterality: Right;    Social History Social History   Tobacco Use   Smoking status: Every Day    Packs/day: 1.00    Years: 50.00    Pack years: 50.00    Types: Cigarettes   Smokeless tobacco: Never  Substance Use Topics   Alcohol use: Yes    Comment: social   Drug use: Never    Family History Family History  Problem Relation Age of Onset   Heart attack Mother    Heart Problems Father    Heart attack Father     No Known Allergies   REVIEW OF SYSTEMS (Negative unless checked)  Constitutional: [] Weight loss  [] Fever  [] Chills Cardiac: [] Chest pain   [] Chest pressure   [] Palpitations   [] Shortness of breath when laying flat   [] Shortness of breath with exertion. Vascular:  [x] Pain in legs with walking   [x] Pain in legs at rest  [] History of DVT   [] Phlebitis   [] Swelling in legs   [] Varicose veins   [] Non-healing ulcers Pulmonary:   [] Uses home oxygen   [] Productive cough   [] Hemoptysis   [] Wheeze  [] COPD   [] Asthma Neurologic:  [] Dizziness   [] Seizures   [] History of stroke   [] History of TIA  [] Aphasia   [] Vissual changes   [] Weakness or numbness in arm   [] Weakness or  numbness in leg Musculoskeletal:   [] Joint swelling   [] Joint pain   [] Low back pain Hematologic:  [] Easy bruising  [] Easy bleeding   [] Hypercoagulable state   [] Anemic Gastrointestinal:  [] Diarrhea   [] Vomiting  [] Gastroesophageal reflux/heartburn   [] Difficulty swallowing. Genitourinary:  [] Chronic kidney disease   [] Difficult urination  [] Frequent urination   [] Blood in urine Skin:  [] Rashes   [] Ulcers  Psychological:  [] History of anxiety   []  History of major depression.  Physical Examination  Vitals:   06/15/21 0752  BP: 132/79  Pulse: 81  Resp: 18  Temp: 98.8 F (37.1 C)  TempSrc: Axillary  SpO2: 98%  Weight: 68 kg  Height: 5\' 7"  (1.702 m)   Body mass index is 23.49 kg/m. Gen:  WD/WN, NAD Head: [place]/AT, No temporalis wasting.  Ear/Nose/Throat: Hearing grossly intact, nares w/o erythema or drainage Eyes: PER, EOMI, sclera nonicteric.  Neck: Supple, no masses.  No bruit or JVD.  Pulmonary:  Good air movement, no audible wheezing, no use of accessory muscles.  Cardiac: RRR, normal S1, S2, no Murmurs. Vascular:   Vessel Right Left  Radial Palpable Palpable  PT Not palpable Not palpable  DP Not palpable Not palpable  Gastrointestinal: soft, non-distended. No guarding/no peritoneal signs.  Musculoskeletal: M/S 5/5 throughout.  No visible deformity.  Neurologic: CN 2-12 intact. Pain and light touch intact in extremities.  Symmetrical.  Speech is fluent. Motor exam as listed above. Psychiatric: Judgment intact, Mood & affect appropriate for pt's clinical situation. Dermatologic: No rashes or ulcers noted.  No changes consistent with cellulitis.   CBC Lab Results  Component Value Date   WBC 8.6 09/10/2020   HGB 13.2 09/10/2020   HCT 40.3 09/10/2020   MCV 90.0 09/10/2020   PLT 245 09/10/2020    BMET    Component Value Date/Time   NA 137 09/10/2020 2313   K 3.0 (L) 09/10/2020 2313   CL 101 09/10/2020 2313   CO2 25 09/10/2020 2313   GLUCOSE 106 (H) 09/10/2020 2313   BUN 8 06/15/2021 0733   CREATININE 1.02 06/15/2021 0733   CALCIUM 9.2 09/10/2020 2313   GFRNONAA >60 06/15/2021 0733   Estimated Creatinine Clearance: 67.5 mL/min (by C-G formula based on SCr of 1.02 mg/dL).  COAG No results found for: INR, PROTIME  Radiology No results found.   Assessment/Plan 1. Atherosclerosis of native artery of both lower extremities with intermittent claudication (HCC) Recommend:   The patient has experienced increased symptoms and is now describing lifestyle limiting claudication and mild rest pain bilaterally.  He is complaining of left sided symptoms a bit more than right and therefore we will start with the left lower extremity but he will require evaluation of  the right as well.     Given the severity of the patient's left lower extremity symptoms the patient should undergo left leg angiography and intervention.  Risk and benefits were reviewed the patient.  Indications for the procedure were reviewed.  All questions were answered, the patient agrees to proceed.    The patient should continue walking and begin a more formal exercise program.  The patient should continue antiplatelet therapy and aggressive treatment of the lipid abnormalities   The patient will follow up with me after the left leg angiogram first and then later right leg can be addressed.     2. Essential hypertension Continue antihypertensive medications as already ordered, these medications have been reviewed and there are no changes at this time.    3. Mixed  hyperlipidemia Continue statin as ordered and reviewed, no changes at this time    4. Personal history of tobacco use, presenting hazards to health Smoking cessation was discussed, 3-10 minutes spent on this topic specifically    [name], MD  06/15/2021 8:03 AM

## 2021-06-15 NOTE — Interval H&P Note (Signed)
History and Physical Interval Note:  06/15/2021 8:07 AM  Corey Olson  has presented today for surgery, with the diagnosis of LLE Angio  BARD   ASO w claudication.  The various methods of treatment have been discussed with the patient and family. After consideration of risks, benefits and other options for treatment, the patient has consented to  Procedure(s): LOWER EXTREMITY ANGIOGRAPHY (Left) as a surgical intervention.  The patient's history has been reviewed, patient examined, no change in status, stable for surgery.  I have reviewed the patient's chart and labs.  Questions were answered to the patient's satisfaction.     Hortencia Pilar

## 2021-06-15 NOTE — H&P (View-Only) (Signed)
° ° °MRN : [id number] ° °Corey Olson is a 66 y.o. (10/30/1955) male who presents with chief complaint of leg pain. ° °History of Present Illness:  °The patient returns to the office for followup and review status post angiogram with intervention on 08/25/2020.  He was recently in the office complaining of increasing pain in both lower extremities and is now describing some mild rest pain. °  °Past Procedure 08/25/2020:  Bilateral common and external iliac artery stenting °  °No new ulcers or wounds have occurred since the last visit. °  °There have been no significant changes to the patient's overall health care. °  °The patient denies amaurosis fugax or recent TIA symptoms. There are no recent neurological changes noted. °The patient denies history of DVT, PE or superficial thrombophlebitis. °The patient denies recent episodes of angina or shortness of breath.  ° °Current Meds  °Medication Sig  ° albuterol (VENTOLIN HFA) 108 (90 Base) MCG/ACT inhaler Inhale 2 puffs into the lungs every 6 (six) hours as needed for wheezing or shortness of breath.  ° allopurinol (ZYLOPRIM) 300 MG tablet Take 300 mg by mouth daily.  ° amLODipine (NORVASC) 10 MG tablet Take 10 mg by mouth daily.  ° aspirin 81 MG EC tablet Take 81 mg by mouth daily.  ° atorvastatin (LIPITOR) 80 MG tablet Take 80 mg by mouth daily.  ° budesonide-formoterol (SYMBICORT) 160-4.5 MCG/ACT inhaler Inhale 2 puffs into the lungs daily.  ° gabapentin (NEURONTIN) 100 MG capsule Take 100 mg by mouth 2 (two) times daily.  ° lisinopril (ZESTRIL) 20 MG tablet Take 20 mg by mouth daily.  ° ° °Past Medical History:  °Diagnosis Date  ° Alcohol use disorder, mild, abuse   ° COPD (chronic obstructive pulmonary disease) (HCC)   ° Gout   ° Hyperlipidemia   ° Hypertension   ° Hypothyroid   ° Peripheral vascular complication   ° Stroke (HCC)   ° ° °Past Surgical History:  °Procedure Laterality Date  ° LOWER EXTREMITY ANGIOGRAPHY Right 08/25/2020  ° Procedure: LOWER EXTREMITY  ANGIOGRAPHY;  Surgeon: Edie Darley G, MD;  Location: [facility] INVASIVE CV LAB;  Service: Cardiovascular;  Laterality: Right;  ° ° °Social History °Social History  ° °Tobacco Use  ° Smoking status: Every Day  °  Packs/day: 1.00  °  Years: 50.00  °  Pack years: 50.00  °  Types: Cigarettes  ° Smokeless tobacco: Never  °Substance Use Topics  ° Alcohol use: Yes  °  Comment: social  ° Drug use: Never  ° ° °Family History °Family History  °Problem Relation Age of Onset  ° Heart attack Mother   ° Heart Problems Father   ° Heart attack Father   ° ° °No Known Allergies ° ° °REVIEW OF SYSTEMS (Negative unless checked) ° °Constitutional: []Weight loss  []Fever  []Chills °Cardiac: []Chest pain   []Chest pressure   []Palpitations   []Shortness of breath when laying flat   []Shortness of breath with exertion. °Vascular:  [x]Pain in legs with walking   [x]Pain in legs at rest  []History of DVT   []Phlebitis   []Swelling in legs   []Varicose veins   []Non-healing ulcers °Pulmonary:   []Uses home oxygen   []Productive cough   []Hemoptysis   []Wheeze  []COPD   []Asthma °Neurologic:  []Dizziness   []Seizures   []History of stroke   []History of TIA  []Aphasia   []Vissual changes   []Weakness or numbness in arm   []Weakness or   numbness in leg Musculoskeletal:   [] Joint swelling   [] Joint pain   [] Low back pain Hematologic:  [] Easy bruising  [] Easy bleeding   [] Hypercoagulable state   [] Anemic Gastrointestinal:  [] Diarrhea   [] Vomiting  [] Gastroesophageal reflux/heartburn   [] Difficulty swallowing. Genitourinary:  [] Chronic kidney disease   [] Difficult urination  [] Frequent urination   [] Blood in urine Skin:  [] Rashes   [] Ulcers  Psychological:  [] History of anxiety   []  History of major depression.  Physical Examination  Vitals:   06/15/21 0752  BP: 132/79  Pulse: 81  Resp: 18  Temp: 98.8 F (37.1 C)  TempSrc: Axillary  SpO2: 98%  Weight: 68 kg  Height: 5\' 7"  (1.702 m)   Body mass index is 23.49 kg/m. Gen:  WD/WN, NAD Head: [place]/AT, No temporalis wasting.  Ear/Nose/Throat: Hearing grossly intact, nares w/o erythema or drainage Eyes: PER, EOMI, sclera nonicteric.  Neck: Supple, no masses.  No bruit or JVD.  Pulmonary:  Good air movement, no audible wheezing, no use of accessory muscles.  Cardiac: RRR, normal S1, S2, no Murmurs. Vascular:   Vessel Right Left  Radial Palpable Palpable  PT Not palpable Not palpable  DP Not palpable Not palpable  Gastrointestinal: soft, non-distended. No guarding/no peritoneal signs.  Musculoskeletal: M/S 5/5 throughout.  No visible deformity.  Neurologic: CN 2-12 intact. Pain and light touch intact in extremities.  Symmetrical.  Speech is fluent. Motor exam as listed above. Psychiatric: Judgment intact, Mood & affect appropriate for pt's clinical situation. Dermatologic: No rashes or ulcers noted.  No changes consistent with cellulitis.   CBC Lab Results  Component Value Date   WBC 8.6 09/10/2020   HGB 13.2 09/10/2020   HCT 40.3 09/10/2020   MCV 90.0 09/10/2020   PLT 245 09/10/2020    BMET    Component Value Date/Time   NA 137 09/10/2020 2313   K 3.0 (L) 09/10/2020 2313   CL 101 09/10/2020 2313   CO2 25 09/10/2020 2313   GLUCOSE 106 (H) 09/10/2020 2313   BUN 8 06/15/2021 0733   CREATININE 1.02 06/15/2021 0733   CALCIUM 9.2 09/10/2020 2313   GFRNONAA >60 06/15/2021 0733   Estimated Creatinine Clearance: 67.5 mL/min (by C-G formula based on SCr of 1.02 mg/dL).  COAG No results found for: INR, PROTIME  Radiology No results found.   Assessment/Plan 1. Atherosclerosis of native artery of both lower extremities with intermittent claudication (HCC) Recommend:   The patient has experienced increased symptoms and is now describing lifestyle limiting claudication and mild rest pain bilaterally.  He is complaining of left sided symptoms a bit more than right and therefore we will start with the left lower extremity but he will require evaluation of  the right as well.     Given the severity of the patient's left lower extremity symptoms the patient should undergo left leg angiography and intervention.  Risk and benefits were reviewed the patient.  Indications for the procedure were reviewed.  All questions were answered, the patient agrees to proceed.    The patient should continue walking and begin a more formal exercise program.  The patient should continue antiplatelet therapy and aggressive treatment of the lipid abnormalities   The patient will follow up with me after the left leg angiogram first and then later right leg can be addressed.     2. Essential hypertension Continue antihypertensive medications as already ordered, these medications have been reviewed and there are no changes at this time.    3. Mixed  hyperlipidemia Continue statin as ordered and reviewed, no changes at this time    4. Personal history of tobacco use, presenting hazards to health Smoking cessation was discussed, 3-10 minutes spent on this topic specifically    [name], MD  06/15/2021 8:03 AM

## 2021-06-16 ENCOUNTER — Encounter: Payer: Self-pay | Admitting: Vascular Surgery

## 2021-07-05 ENCOUNTER — Ambulatory Visit (INDEPENDENT_AMBULATORY_CARE_PROVIDER_SITE_OTHER): Payer: Medicare Other

## 2021-07-05 ENCOUNTER — Ambulatory Visit (INDEPENDENT_AMBULATORY_CARE_PROVIDER_SITE_OTHER): Payer: Medicare Other | Admitting: Vascular Surgery

## 2021-07-05 ENCOUNTER — Other Ambulatory Visit (INDEPENDENT_AMBULATORY_CARE_PROVIDER_SITE_OTHER): Payer: Self-pay | Admitting: Vascular Surgery

## 2021-07-05 ENCOUNTER — Other Ambulatory Visit: Payer: Self-pay

## 2021-07-05 DIAGNOSIS — I70219 Atherosclerosis of native arteries of extremities with intermittent claudication, unspecified extremity: Secondary | ICD-10-CM

## 2021-07-05 DIAGNOSIS — Z01818 Encounter for other preprocedural examination: Secondary | ICD-10-CM

## 2021-08-01 IMAGING — CT CT CHEST LUNG CANCER SCREENING LOW DOSE W/O CM
2 of 5 series · 15 of 40 positions shown, 18 images · non-contrast
Comparison: 06/20/2019

CLINICAL DATA: Lung cancer screening. Current asymptomatic smoker
with 52 pack-year history.

EXAM:
CT CHEST WITHOUT CONTRAST LOW-DOSE FOR LUNG CANCER SCREENING
TECHNIQUE: Multidetector CT imaging of the chest was performed following the
standard protocol without IV contrast.

[Series 3: lung 1.00 · axial · 0.65mm/px · z∈[-1222,-909]mm · 12 of 345 slices shown, 15 images]
[im 16/345  mediastinal]
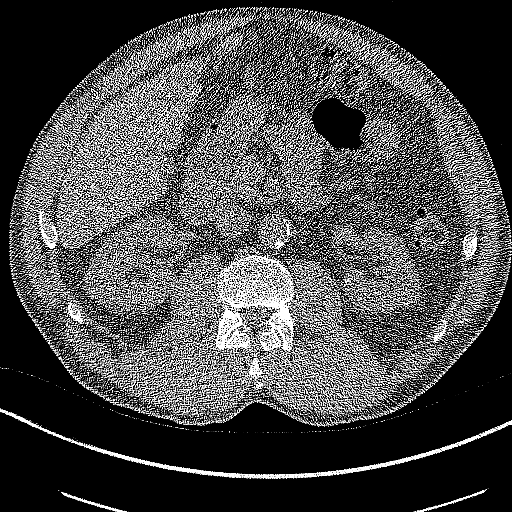
[im 16/345  lung]
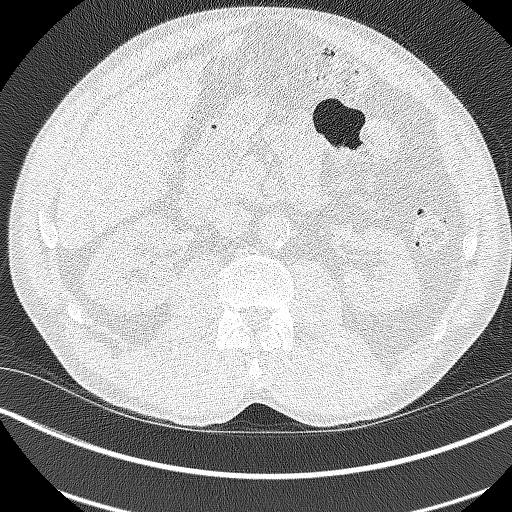
[im 47/345  lung]
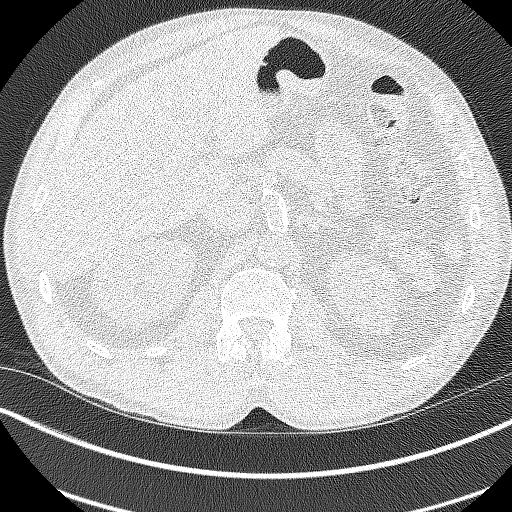
[im 79/345  lung]
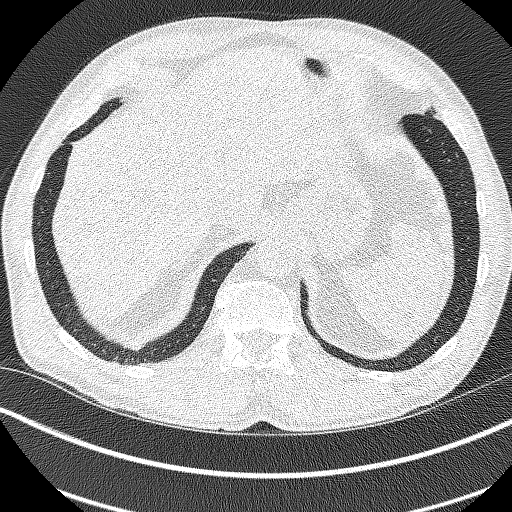
[im 110/345  lung]
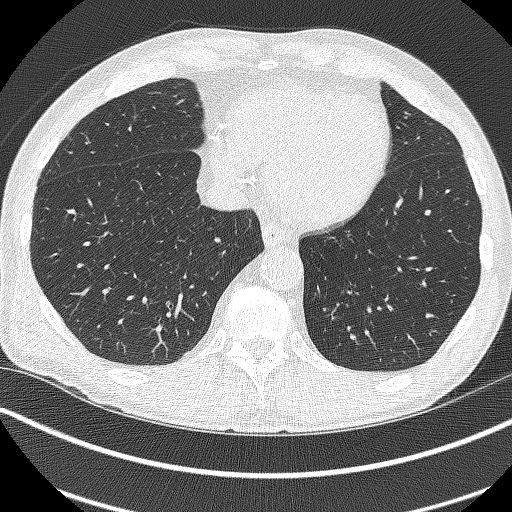
[im 126/345  mediastinal]
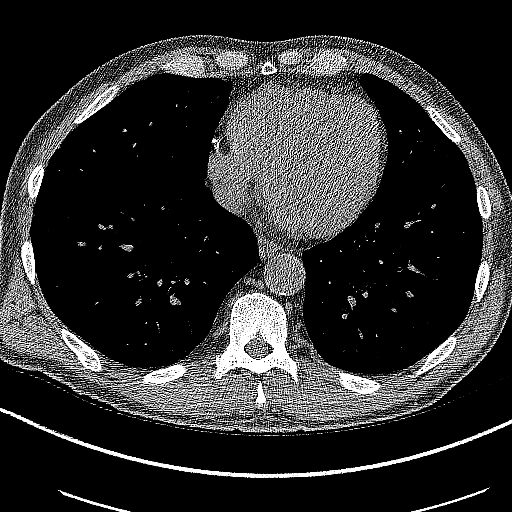
[im 126/345  lung]
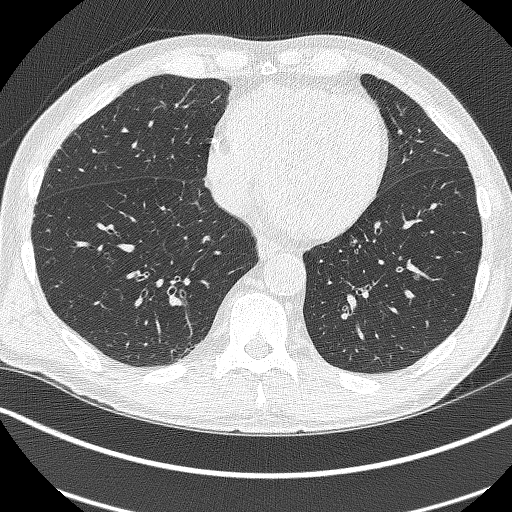
[im 157/345  lung]
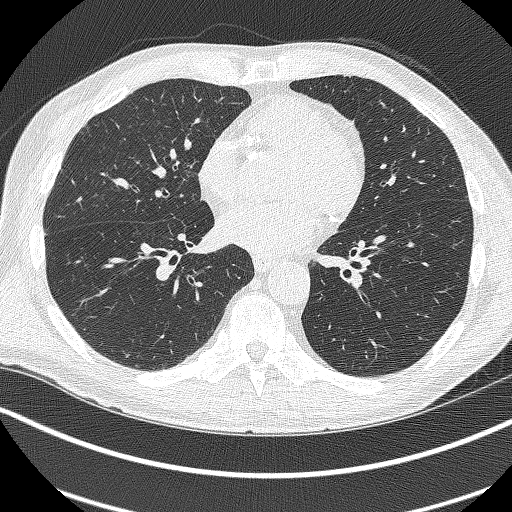
[im 188/345  lung]
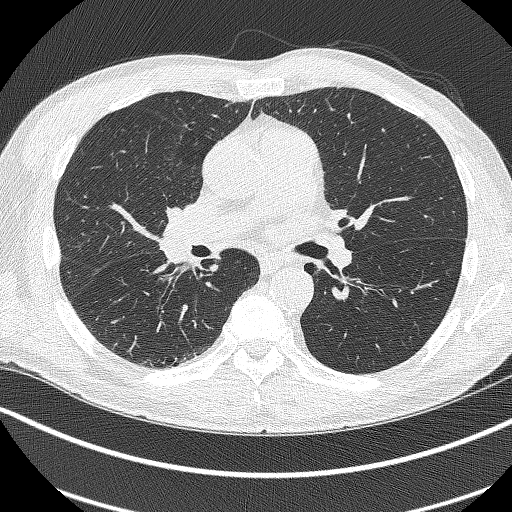
[im 219/345  lung]
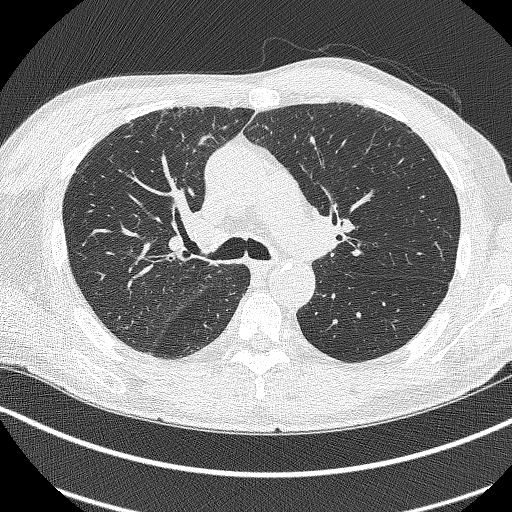
[im 235/345  mediastinal]
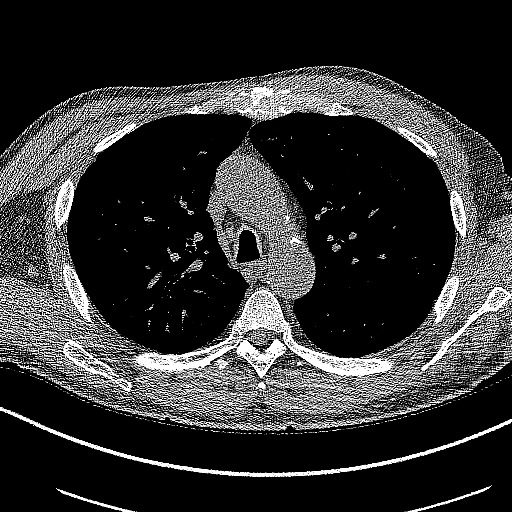
[im 235/345  lung]
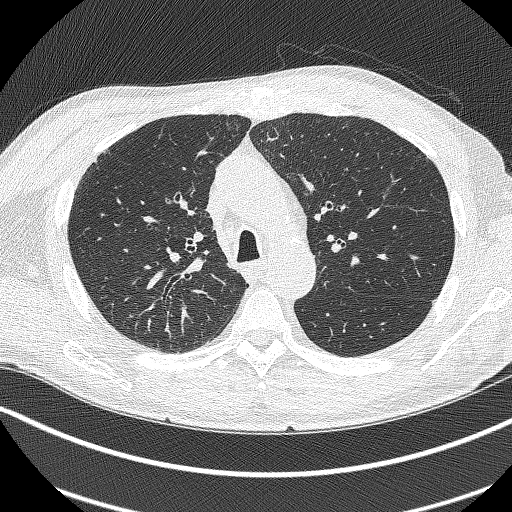
[im 266/345  lung]
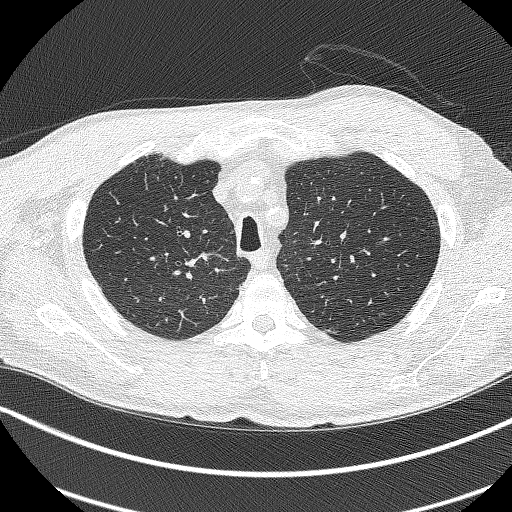
[im 298/345  lung]
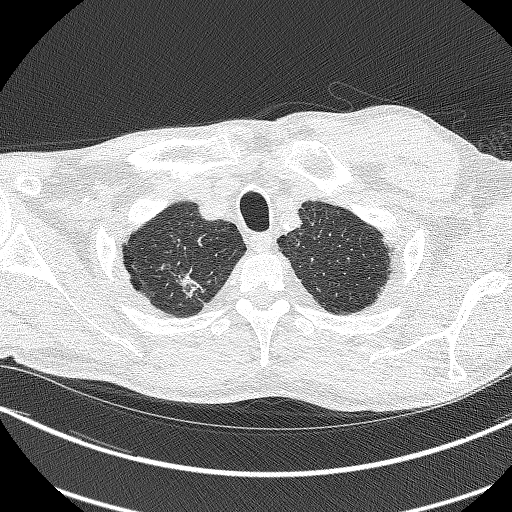
[im 329/345  lung]
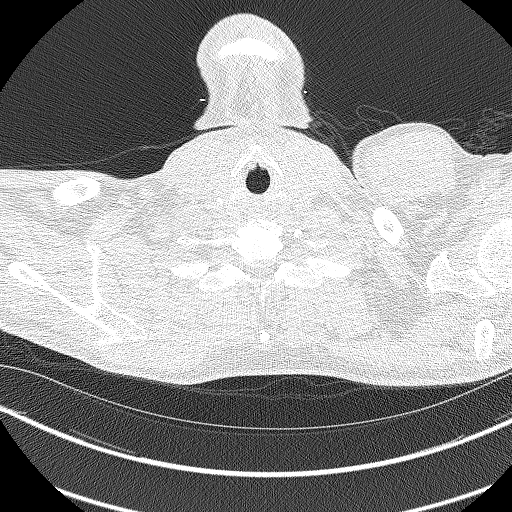

[Series 5: coronals lung 1.00 cor · coronal · 0.65mm/px · 3 of 270 slices shown]
[im 54/270  lung]
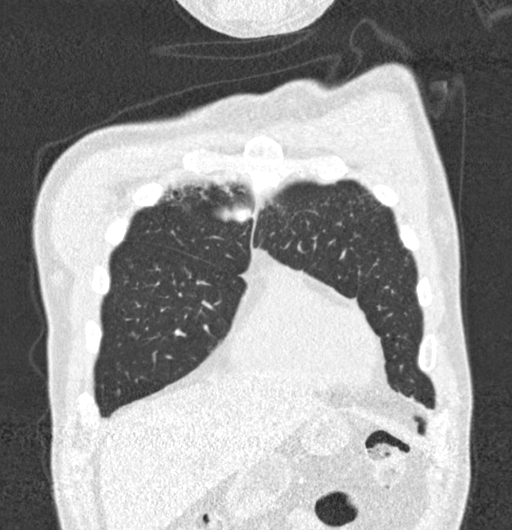
[im 108/270  lung]
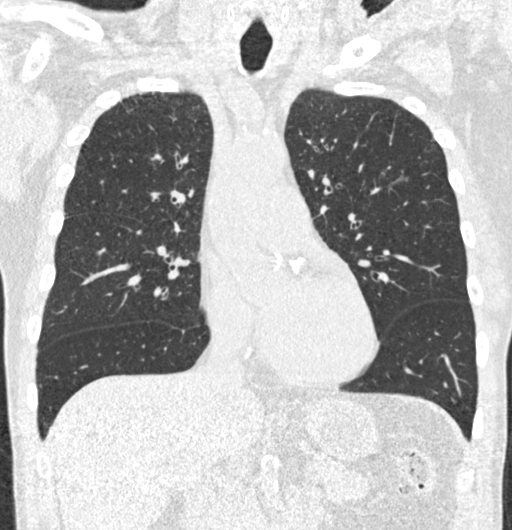
[im 162/270  lung]
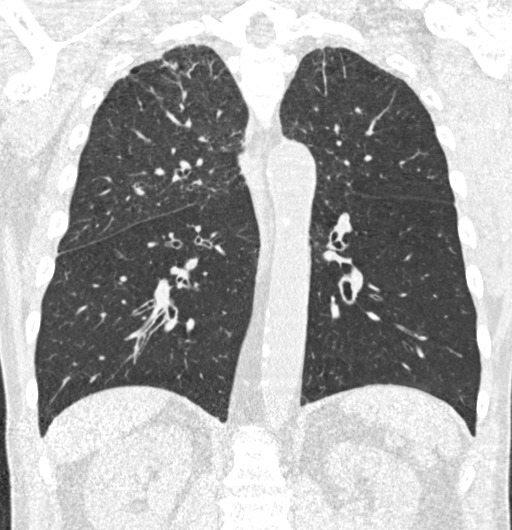

[15 of 40 positions shown; findings below may reference images not displayed]

FINDINGS: Cardiovascular: Normal heart size. No pericardial effusion. Aortic
atherosclerosis. Coronary artery calcifications.

Mediastinum/Nodes: No enlarged mediastinal, hilar, or axillary lymph
nodes. Thyroid gland, trachea, and esophagus demonstrate no
significant findings.

Lungs/Pleura: Centrilobular and paraseptal emphysema. Diffuse
bronchial wall thickening. No pleural effusion, airspace
consolidation or atelectasis. Large nodular area of architectural
distortion near the apex of the right lung is similar to numerous
previous exams. On today's study this has a mean derived diameter of
9.1 mm. No suspicious lung nodules identified at this time.

Upper Abdomen: There are multiple low-attenuation foci within the
liver which appears similar to previous exam. Likely multiple liver
cysts or biliary hamartomas. Extensive aortic atherosclerosis with
stable aneurysmal dilatation of the celiac artery. No acute
findings.

Musculoskeletal: No chest wall mass or suspicious bone lesions
identified.
IMPRESSION: 1. Lung-RADS 2, benign appearance or behavior. Continue annual
screening with low-dose chest CT without contrast in 12 months.
2. Coronary artery calcifications.

Aortic Atherosclerosis (YE0CG-MP0.0) and Emphysema (YE0CG-HJ9.X).

## 2021-09-01 ENCOUNTER — Telehealth: Payer: Self-pay | Admitting: *Deleted

## 2021-09-01 NOTE — Telephone Encounter (Signed)
LMTC to schedule Yearly Lung CA CT Scan. 

## 2021-12-21 ENCOUNTER — Telehealth: Payer: Self-pay | Admitting: *Deleted

## 2021-12-21 NOTE — Telephone Encounter (Signed)
Left message for patient to call back to schedule follow up lung cancer screening CT scan.  

## 2022-01-10 NOTE — Telephone Encounter (Signed)
Returned call from VM. No answer. Left VM message and call back info

## 2022-05-11 ENCOUNTER — Telehealth: Payer: Self-pay | Admitting: Cardiology

## 2022-05-11 NOTE — Telephone Encounter (Signed)
Pt c/o medication issue:  1. Name of Medication: Metoprolol XL 50 MG  2. How are you currently taking this medication (dosage and times per day)?   3. Are you having a reaction (difficulty breathing--STAT)? No  4. What is your medication issue? Office needing confirmation that medication has been discontinued. Please advise

## 2022-05-11 NOTE — Telephone Encounter (Signed)
Called number back and left voicemail message to call back for review of their questions.   This patient was last seen in 03/08/2021 and at that visit they stopped his metoprolol tartrate and started him on metoprolol succinate 50 mg once daily. He has not been seen since that visit.

## 2022-05-12 ENCOUNTER — Other Ambulatory Visit (INDEPENDENT_AMBULATORY_CARE_PROVIDER_SITE_OTHER): Payer: Self-pay | Admitting: Nurse Practitioner

## 2022-05-12 ENCOUNTER — Other Ambulatory Visit (INDEPENDENT_AMBULATORY_CARE_PROVIDER_SITE_OTHER): Payer: Self-pay | Admitting: Vascular Surgery

## 2022-05-12 DIAGNOSIS — I70202 Unspecified atherosclerosis of native arteries of extremities, left leg: Secondary | ICD-10-CM

## 2022-05-12 DIAGNOSIS — I701 Atherosclerosis of renal artery: Secondary | ICD-10-CM

## 2022-05-12 DIAGNOSIS — I70209 Unspecified atherosclerosis of native arteries of extremities, unspecified extremity: Secondary | ICD-10-CM

## 2022-05-12 DIAGNOSIS — I739 Peripheral vascular disease, unspecified: Secondary | ICD-10-CM

## 2022-05-12 DIAGNOSIS — I70201 Unspecified atherosclerosis of native arteries of extremities, right leg: Secondary | ICD-10-CM

## 2022-05-17 NOTE — Progress Notes (Deleted)
MRN : [id number]  Corey Olson is a 66 y.o. (1956/04/19) male who presents with chief complaint of check circulation.  History of Present Illness:   The patient returns to the office for followup and review status post angiogram with intervention on 06/15/2021 (about one year ago).   Procedure: Diagnostic bilateral lower extremity arteriography   The patient's aortoiliac inflow is patent previously placed stents remain free of hemodynamically significant stenosis.  On the right the common femoral has profound disease and would require endarterectomy with potential femoral to anterior tibial bypass grafting if saphenous vein is available.  On the left the common femoral is severely diseased and would require endarterectomy with potential for a femoral to posterior tibial bypass graft if saphenous vein is available.   The patient notes improvement in the lower extremity symptoms. No interval shortening of the patient's claudication distance or rest pain symptoms. No new ulcers or wounds have occurred since the last visit.  There have been no significant changes to the patient's overall health care.  No documented history of amaurosis fugax or recent TIA symptoms. There are no recent neurological changes noted. No documented history of DVT, PE or superficial thrombophlebitis. The patient denies recent episodes of angina or shortness of breath.   ABI's Rt=*** and Lt=***  (previous ABI's Rt=*** and Lt=***) Duplex US of the *** lower extremity arterial system shows ***  No outpatient medications have been marked as taking for the 05/26/22 encounter (Appointment) with Gilda Crease, Latina Craver, MD.    Past Medical History:  Diagnosis Date   Alcohol use disorder, mild, abuse    COPD (chronic obstructive pulmonary disease) (HCC)    Gout    Hyperlipidemia    Hypertension    Hypothyroid    Peripheral vascular complication    Stroke [facility])     Past Surgical History:   Procedure Laterality Date   LOWER EXTREMITY ANGIOGRAPHY Right 08/25/2020   Procedure: LOWER EXTREMITY ANGIOGRAPHY;  Surgeon: Renford Dills, MD;  Location: [facility] INVASIVE CV LAB;  Service: Cardiovascular;  Laterality: Right;   LOWER EXTREMITY ANGIOGRAPHY Left 06/15/2021   Procedure: LOWER EXTREMITY ANGIOGRAPHY;  Surgeon: Renford Dills, MD;  Location: [facility] INVASIVE CV LAB;  Service: Cardiovascular;  Laterality: Left;    Social History Social History   Tobacco Use   Smoking status: Every Day    Packs/day: 1.00    Years: 50.00    Total pack years: 50.00    Types: Cigarettes   Smokeless tobacco: Never  Substance Use Topics   Alcohol use: Yes    Comment: social   Drug use: Never    Family History Family History  Problem Relation Age of Onset   Heart attack Mother    Heart Problems Father    Heart attack Father     No Known Allergies   REVIEW OF SYSTEMS (Negative unless checked)  Constitutional: [] Weight loss  [] Fever  [] Chills Cardiac: [] Chest pain   [] Chest pressure   [] Palpitations   [] Shortness of breath when laying flat   [] Shortness of breath with exertion. Vascular:  [x] Pain in legs with walking   [] Pain in legs at rest  [] History of DVT   [] Phlebitis   [] Swelling in legs   [] Varicose veins   [] Non-healing ulcers Pulmonary:   [] Uses home oxygen   [] Productive cough   [] Hemoptysis   [] Wheeze  [] COPD   [] Asthma Neurologic:  [] Dizziness   [] Seizures   []   History of stroke   [] History of TIA  [] Aphasia   [] Vissual changes   [] Weakness or numbness in arm   [] Weakness or numbness in leg Musculoskeletal:   [] Joint swelling   [] Joint pain   [] Low back pain Hematologic:  [] Easy bruising  [] Easy bleeding   [] Hypercoagulable state   [] Anemic Gastrointestinal:  [] Diarrhea   [] Vomiting  [] Gastroesophageal reflux/heartburn   [] Difficulty swallowing. Genitourinary:  [] Chronic kidney disease   [] Difficult urination  [] Frequent urination   [] Blood in urine Skin:  [] Rashes    [] Ulcers  Psychological:  [] History of anxiety   []  History of major depression.  Physical Examination  There were no vitals filed for this visit. There is no height or weight on file to calculate BMI. Gen: WD/WN, NAD Head: [place]/AT, No temporalis wasting.  Ear/Nose/Throat: Hearing grossly intact, nares w/o erythema or drainage Eyes: PER, EOMI, sclera nonicteric.  Neck: Supple, no masses.  No bruit or JVD.  Pulmonary:  Good air movement, no audible wheezing, no use of accessory muscles.  Cardiac: RRR, normal S1, S2, no Murmurs. Vascular:  mild trophic changes, no open wounds Vessel Right Left  Radial Palpable Palpable  PT Not Palpable Not Palpable  DP Not Palpable Not Palpable  Gastrointestinal: soft, non-distended. No guarding/no peritoneal signs.  Musculoskeletal: M/S 5/5 throughout.  No visible deformity.  Neurologic: CN 2-12 intact. Pain and light touch intact in extremities.  Symmetrical.  Speech is fluent. Motor exam as listed above. Psychiatric: Judgment intact, Mood & affect appropriate for pt's clinical situation. Dermatologic: No rashes or ulcers noted.  No changes consistent with cellulitis.   CBC Lab Results  Component Value Date   WBC 8.6 09/10/2020   HGB 13.2 09/10/2020   HCT 40.3 09/10/2020   MCV 90.0 09/10/2020   PLT 245 09/10/2020    BMET    Component Value Date/Time   NA 137 09/10/2020 2313   K 3.0 (L) 09/10/2020 2313   CL 101 09/10/2020 2313   CO2 25 09/10/2020 2313   GLUCOSE 106 (H) 09/10/2020 2313   BUN 8 06/15/2021 0733   CREATININE 1.02 06/15/2021 0733   CALCIUM 9.2 09/10/2020 2313   GFRNONAA >60 06/15/2021 0733   CrCl cannot be calculated (Patient's most recent lab result is older than the maximum 21 days allowed.).  COAG No results found for: "INR", "PROTIME"  Radiology No results found.   Assessment/Plan There are no diagnoses linked to this encounter.   , MD  05/17/2022 4:02 PM

## 2022-05-17 NOTE — Telephone Encounter (Signed)
Called and left message to call back for any questions.

## 2022-05-18 NOTE — Telephone Encounter (Signed)
Cindy with [facility] calling back.

## 2022-05-18 NOTE — Telephone Encounter (Signed)
Left message to call back  

## 2022-05-19 ENCOUNTER — Encounter (INDEPENDENT_AMBULATORY_CARE_PROVIDER_SITE_OTHER): Payer: Medicare Other

## 2022-05-19 NOTE — Telephone Encounter (Signed)
[facility] is returning call. Transferred to [employer], Charity fundraiser.

## 2022-05-19 NOTE — Telephone Encounter (Signed)
Cindy from [facility] called to confirm pt's metoprolol dose.  Per chart review, pt last seen on 03/08/21. During visit, Dr. Myriam Forehand stopped Metoprolol tartrate and started Metoprolol Succinate 50 mg daily. Arline Asp made aware and verbalized understanding.

## 2022-05-26 ENCOUNTER — Ambulatory Visit (INDEPENDENT_AMBULATORY_CARE_PROVIDER_SITE_OTHER): Payer: Medicare Other | Admitting: Vascular Surgery

## 2022-05-26 DIAGNOSIS — I10 Essential (primary) hypertension: Secondary | ICD-10-CM

## 2022-05-26 DIAGNOSIS — I70213 Atherosclerosis of native arteries of extremities with intermittent claudication, bilateral legs: Secondary | ICD-10-CM

## 2022-05-26 DIAGNOSIS — I728 Aneurysm of other specified arteries: Secondary | ICD-10-CM

## 2022-05-26 DIAGNOSIS — E782 Mixed hyperlipidemia: Secondary | ICD-10-CM

## 2022-05-26 DIAGNOSIS — I701 Atherosclerosis of renal artery: Secondary | ICD-10-CM

## 2022-06-01 ENCOUNTER — Telehealth: Payer: Self-pay | Admitting: Cardiology

## 2022-06-01 NOTE — Telephone Encounter (Signed)
Have made several attempts to schedule with no success Deleted recall 

## 2022-06-01 NOTE — Telephone Encounter (Signed)
Noted, thanks!

## 2022-06-23 ENCOUNTER — Ambulatory Visit (INDEPENDENT_AMBULATORY_CARE_PROVIDER_SITE_OTHER): Payer: Medicare Other | Admitting: Vascular Surgery

## 2022-06-23 ENCOUNTER — Encounter (INDEPENDENT_AMBULATORY_CARE_PROVIDER_SITE_OTHER): Payer: Medicare Other

## 2022-06-23 NOTE — Progress Notes (Deleted)
MRN : [id number]  Corey Olson is a 67 y.o. ([birth year]) male who presents with chief complaint of check access.  History of Present Illness:  The patient is seen for evaluation for dialysis access. The patient has chronic renal insufficiency stage V secondary to hypertension. The patient's most recent creatinine clearance is less than 20. The patient volume status has not yet become an issue. Patient's blood pressures been relatively well controlled. There are mild uremic symptoms which appear to be relatively well tolerated at this time.  The patient notes the kidney problem has been present for a long time and has been progressively getting worse.  The patient is followed by nephrology.    The patient is right-handed.  The patient has been considering the various methods of dialysis and wishes to proceed with hemodialysis and therefore creation of AV access.  No recent shortening of the patient's walking distance or new symptoms consistent with claudication.  No history of rest pain symptoms. No new ulcers or wounds of the lower extremities have occurred.  The patient denies amaurosis fugax or recent TIA symptoms. There are no recent neurological changes noted. There is no history of DVT, PE or superficial thrombophlebitis. No recent episodes of angina or shortness of breath documented.   No outpatient medications have been marked as taking for the 06/23/22 encounter (Appointment) with Delana Meyer, Dolores Lory, MD.    Past Medical History:  Diagnosis Date   Alcohol use disorder, mild, abuse    COPD (chronic obstructive pulmonary disease) ([place])    Gout    Hyperlipidemia    Hypertension    Hypothyroid    Peripheral vascular complication    Stroke [facility])     Past Surgical History:  Procedure Laterality Date   LOWER EXTREMITY ANGIOGRAPHY Right 08/25/2020   Procedure: LOWER EXTREMITY ANGIOGRAPHY;  Surgeon: Katha Cabal, MD;  Location: [facility];   Service: Cardiovascular;  Laterality: Right;   LOWER EXTREMITY ANGIOGRAPHY Left 06/15/2021   Procedure: LOWER EXTREMITY ANGIOGRAPHY;  Surgeon: Katha Cabal, MD;  Location: [facility];  Service: Cardiovascular;  Laterality: Left;    Social History Social History   Tobacco Use   Smoking status: Every Day    Packs/day: 1.00    Years: 50.00    Total pack years: 50.00    Types: Cigarettes   Smokeless tobacco: Never  Substance Use Topics   Alcohol use: Yes    Comment: social   Drug use: Never    Family History Family History  Problem Relation Age of Onset   Heart attack Mother    Heart Problems Father    Heart attack Father     No Known Allergies   REVIEW OF SYSTEMS (Negative unless checked)  Constitutional: '[]'$ Weight loss  '[]'$ Fever  '[]'$ Chills Cardiac: '[]'$ Chest pain   '[]'$ Chest pressure   '[]'$ Palpitations   '[]'$ Shortness of breath when laying flat   '[]'$ Shortness of breath with exertion. Vascular:  '[]'$ Pain in legs with walking   '[]'$ Pain in legs at rest  '[]'$ History of DVT   '[]'$ Phlebitis   '[]'$ Swelling in legs   '[]'$ Varicose veins   '[]'$ Non-healing ulcers Pulmonary:   '[]'$ Uses home oxygen   '[]'$ Productive cough   '[]'$ Hemoptysis   '[]'$ Wheeze  '[]'$ COPD   '[]'$ Asthma Neurologic:  '[]'$ Dizziness   '[]'$ Seizures   '[]'$ History of stroke   '[]'$ History of TIA  '[]'$ Aphasia   '[]'$   Vissual changes   '[]'$ Weakness or numbness in arm   '[]'$ Weakness or numbness in leg Musculoskeletal:   '[]'$ Joint swelling   '[]'$ Joint pain   '[]'$ Low back pain Hematologic:  '[]'$ Easy bruising  '[]'$ Easy bleeding   '[]'$ Hypercoagulable state   '[]'$ Anemic Gastrointestinal:  '[]'$ Diarrhea   '[]'$ Vomiting  '[]'$ Gastroesophageal reflux/heartburn   '[]'$ Difficulty swallowing. Genitourinary:  '[x]'$ Chronic kidney disease   '[]'$ Difficult urination  '[]'$ Frequent urination   '[]'$ Blood in urine Skin:  '[]'$ Rashes   '[]'$ Ulcers  Psychological:  '[]'$ History of anxiety   '[]'$  History of major depression.  Physical Examination  There were no vitals filed for this visit. There is no height or weight on file to  calculate BMI. Gen: WD/WN, NAD Head: [place]/AT, No temporalis wasting.  Ear/Nose/Throat: Hearing grossly intact, nares w/o erythema or drainage Eyes: PER, EOMI, sclera nonicteric.  Neck: Supple, no gross masses or lesions.  No JVD.  Pulmonary:  Good air movement, no audible wheezing, no use of accessory muscles.  Cardiac: RRR, precordium non-hyperdynamic. Vascular:   *** Vessel Right Left  Radial Palpable Palpable  Brachial Palpable Palpable  Gastrointestinal: soft, non-distended. No guarding/no peritoneal signs.  Musculoskeletal: M/S 5/5 throughout.  No deformity.  Neurologic: CN 2-12 intact. Pain and light touch intact in extremities.  Symmetrical.  Speech is fluent. Motor exam as listed above. Psychiatric: Judgment intact, Mood & affect appropriate for pt's clinical situation. Dermatologic: No rashes or ulcers noted.  No changes consistent with cellulitis.   CBC Lab Results  Component Value Date   WBC 8.6 09/10/2020   HGB 13.2 09/10/2020   HCT 40.3 09/10/2020   MCV 90.0 09/10/2020   PLT 245 09/10/2020    BMET    Component Value Date/Time   NA 137 09/10/2020 2313   K 3.0 (L) 09/10/2020 2313   CL 101 09/10/2020 2313   CO2 25 09/10/2020 2313   GLUCOSE 106 (H) 09/10/2020 2313   BUN 8 06/15/2021 0733   CREATININE 1.02 06/15/2021 0733   CALCIUM 9.2 09/10/2020 2313   GFRNONAA >60 06/15/2021 0733   CrCl cannot be calculated (Patient's most recent lab result is older than the maximum 21 days allowed.).  COAG No results found for: "INR", "PROTIME"  Radiology No results found.   Assessment/Plan There are no diagnoses linked to this encounter.   Hortencia Pilar, MD  06/23/2022 8:39 AM

## 2022-11-01 ENCOUNTER — Observation Stay: Payer: Medicare Other

## 2022-11-01 ENCOUNTER — Emergency Department: Payer: Medicare Other

## 2022-11-01 ENCOUNTER — Inpatient Hospital Stay
Admission: EM | Admit: 2022-11-01 | Discharge: 2022-11-22 | DRG: 271 | Disposition: A | Discharge status: Skilled Nursing Facility | Payer: Medicare Other | Attending: Internal Medicine | Admitting: Internal Medicine

## 2022-11-01 DIAGNOSIS — Z8249 Family history of ischemic heart disease and other diseases of the circulatory system: Secondary | ICD-10-CM

## 2022-11-01 DIAGNOSIS — K5900 Constipation, unspecified: Secondary | ICD-10-CM | POA: Diagnosis not present

## 2022-11-01 DIAGNOSIS — I701 Atherosclerosis of renal artery: Secondary | ICD-10-CM | POA: Diagnosis present

## 2022-11-01 DIAGNOSIS — Z720 Tobacco use: Secondary | ICD-10-CM | POA: Diagnosis present

## 2022-11-01 DIAGNOSIS — M6282 Rhabdomyolysis: Secondary | ICD-10-CM | POA: Diagnosis present

## 2022-11-01 DIAGNOSIS — I10 Essential (primary) hypertension: Secondary | ICD-10-CM | POA: Diagnosis present

## 2022-11-01 DIAGNOSIS — Y92009 Unspecified place in unspecified non-institutional (private) residence as the place of occurrence of the external cause: Secondary | ICD-10-CM

## 2022-11-01 DIAGNOSIS — I4891 Unspecified atrial fibrillation: Secondary | ICD-10-CM | POA: Diagnosis present

## 2022-11-01 DIAGNOSIS — R7989 Other specified abnormal findings of blood chemistry: Secondary | ICD-10-CM | POA: Diagnosis present

## 2022-11-01 DIAGNOSIS — R29898 Other symptoms and signs involving the musculoskeletal system: Secondary | ICD-10-CM | POA: Diagnosis present

## 2022-11-01 DIAGNOSIS — Z86718 Personal history of other venous thrombosis and embolism: Secondary | ICD-10-CM

## 2022-11-01 DIAGNOSIS — I998 Other disorder of circulatory system: Secondary | ICD-10-CM | POA: Diagnosis not present

## 2022-11-01 DIAGNOSIS — I25118 Atherosclerotic heart disease of native coronary artery with other forms of angina pectoris: Secondary | ICD-10-CM

## 2022-11-01 DIAGNOSIS — I252 Old myocardial infarction: Secondary | ICD-10-CM

## 2022-11-01 DIAGNOSIS — E039 Hypothyroidism, unspecified: Secondary | ICD-10-CM | POA: Diagnosis present

## 2022-11-01 DIAGNOSIS — I70223 Atherosclerosis of native arteries of extremities with rest pain, bilateral legs: Principal | ICD-10-CM | POA: Diagnosis present

## 2022-11-01 DIAGNOSIS — D62 Acute posthemorrhagic anemia: Secondary | ICD-10-CM | POA: Diagnosis not present

## 2022-11-01 DIAGNOSIS — I2510 Atherosclerotic heart disease of native coronary artery without angina pectoris: Secondary | ICD-10-CM | POA: Diagnosis present

## 2022-11-01 DIAGNOSIS — I82432 Acute embolism and thrombosis of left popliteal vein: Secondary | ICD-10-CM | POA: Diagnosis present

## 2022-11-01 DIAGNOSIS — I82412 Acute embolism and thrombosis of left femoral vein: Secondary | ICD-10-CM | POA: Diagnosis present

## 2022-11-01 DIAGNOSIS — I639 Cerebral infarction, unspecified: Secondary | ICD-10-CM | POA: Diagnosis not present

## 2022-11-01 DIAGNOSIS — Z7951 Long term (current) use of inhaled steroids: Secondary | ICD-10-CM

## 2022-11-01 DIAGNOSIS — E785 Hyperlipidemia, unspecified: Secondary | ICD-10-CM | POA: Diagnosis present

## 2022-11-01 DIAGNOSIS — F1010 Alcohol abuse, uncomplicated: Secondary | ICD-10-CM | POA: Diagnosis present

## 2022-11-01 DIAGNOSIS — I4510 Unspecified right bundle-branch block: Secondary | ICD-10-CM | POA: Diagnosis present

## 2022-11-01 DIAGNOSIS — E876 Hypokalemia: Secondary | ICD-10-CM | POA: Diagnosis present

## 2022-11-01 DIAGNOSIS — F17210 Nicotine dependence, cigarettes, uncomplicated: Secondary | ICD-10-CM | POA: Diagnosis present

## 2022-11-01 DIAGNOSIS — R4182 Altered mental status, unspecified: Secondary | ICD-10-CM | POA: Diagnosis not present

## 2022-11-01 DIAGNOSIS — Z7982 Long term (current) use of aspirin: Secondary | ICD-10-CM

## 2022-11-01 DIAGNOSIS — I739 Peripheral vascular disease, unspecified: Secondary | ICD-10-CM | POA: Diagnosis present

## 2022-11-01 DIAGNOSIS — Z79899 Other long term (current) drug therapy: Secondary | ICD-10-CM

## 2022-11-01 DIAGNOSIS — W19XXXA Unspecified fall, initial encounter: Secondary | ICD-10-CM | POA: Diagnosis not present

## 2022-11-01 DIAGNOSIS — E872 Acidosis, unspecified: Secondary | ICD-10-CM | POA: Diagnosis present

## 2022-11-01 DIAGNOSIS — R6510 Systemic inflammatory response syndrome (SIRS) of non-infectious origin without acute organ dysfunction: Secondary | ICD-10-CM | POA: Diagnosis not present

## 2022-11-01 DIAGNOSIS — I429 Cardiomyopathy, unspecified: Secondary | ICD-10-CM | POA: Diagnosis present

## 2022-11-01 DIAGNOSIS — Y838 Other surgical procedures as the cause of abnormal reaction of the patient, or of later complication, without mention of misadventure at the time of the procedure: Secondary | ICD-10-CM | POA: Diagnosis not present

## 2022-11-01 DIAGNOSIS — Z7901 Long term (current) use of anticoagulants: Secondary | ICD-10-CM

## 2022-11-01 DIAGNOSIS — R9431 Abnormal electrocardiogram [ECG] [EKG]: Secondary | ICD-10-CM | POA: Diagnosis present

## 2022-11-01 DIAGNOSIS — I97638 Postprocedural hematoma of a circulatory system organ or structure following other circulatory system procedure: Secondary | ICD-10-CM | POA: Diagnosis not present

## 2022-11-01 DIAGNOSIS — F10139 Alcohol abuse with withdrawal, unspecified: Secondary | ICD-10-CM | POA: Diagnosis not present

## 2022-11-01 DIAGNOSIS — I70219 Atherosclerosis of native arteries of extremities with intermittent claudication, unspecified extremity: Secondary | ICD-10-CM | POA: Diagnosis present

## 2022-11-01 DIAGNOSIS — K760 Fatty (change of) liver, not elsewhere classified: Secondary | ICD-10-CM | POA: Diagnosis present

## 2022-11-01 DIAGNOSIS — R29810 Facial weakness: Secondary | ICD-10-CM | POA: Diagnosis present

## 2022-11-01 DIAGNOSIS — J449 Chronic obstructive pulmonary disease, unspecified: Secondary | ICD-10-CM | POA: Diagnosis present

## 2022-11-01 DIAGNOSIS — I69351 Hemiplegia and hemiparesis following cerebral infarction affecting right dominant side: Secondary | ICD-10-CM

## 2022-11-01 DIAGNOSIS — T148XXA Other injury of unspecified body region, initial encounter: Secondary | ICD-10-CM

## 2022-11-01 DIAGNOSIS — Y713 Surgical instruments, materials and cardiovascular devices (including sutures) associated with adverse incidents: Secondary | ICD-10-CM | POA: Diagnosis not present

## 2022-11-01 LAB — CBC
HCT: 44.9 % (ref 39.0–52.0)
Hemoglobin: 14.5 g/dL (ref 13.0–17.0)
MCH: 29.2 pg (ref 26.0–34.0)
MCHC: 32.3 g/dL (ref 30.0–36.0)
MCV: 90.3 fL (ref 80.0–100.0)
Platelets: 156 10*3/uL (ref 150–400)
RBC: 4.97 MIL/uL (ref 4.22–5.81)
RDW: 14.8 % (ref 11.5–15.5)
WBC: 9.3 10*3/uL (ref 4.0–10.5)
nRBC: 0 % (ref 0.0–0.2)

## 2022-11-01 LAB — URINALYSIS, ROUTINE W REFLEX MICROSCOPIC
Bacteria, UA: NONE SEEN
Bilirubin Urine: NEGATIVE
Glucose, UA: NEGATIVE mg/dL
Ketones, ur: 80 mg/dL — AB
Leukocytes,Ua: NEGATIVE
Nitrite: NEGATIVE
Protein, ur: 100 mg/dL — AB
Specific Gravity, Urine: 1.023 (ref 1.005–1.030)
Squamous Epithelial / HPF: NONE SEEN /HPF (ref 0–5)
pH: 5 (ref 5.0–8.0)

## 2022-11-01 LAB — COMPREHENSIVE METABOLIC PANEL
ALT: 63 U/L — ABNORMAL HIGH (ref 0–44)
AST: 296 U/L — ABNORMAL HIGH (ref 15–41)
Albumin: 4.2 g/dL (ref 3.5–5.0)
Alkaline Phosphatase: 55 U/L (ref 38–126)
Anion gap: 15 (ref 5–15)
BUN: 14 mg/dL (ref 8–23)
CO2: 19 mmol/L — ABNORMAL LOW (ref 22–32)
Calcium: 8.5 mg/dL — ABNORMAL LOW (ref 8.9–10.3)
Chloride: 102 mmol/L (ref 98–111)
Creatinine, Ser: 1.08 mg/dL (ref 0.61–1.24)
GFR, Estimated: >60 mL/min (ref >60)
Glucose, Bld: 88 mg/dL (ref 70–99)
Potassium: 3.5 mmol/L (ref 3.5–5.1)
Sodium: 136 mmol/L (ref 135–145)
Total Bilirubin: 1.3 mg/dL — ABNORMAL HIGH (ref 0.3–1.2)
Total Protein: 7 g/dL (ref 6.5–8.1)

## 2022-11-01 LAB — DIFFERENTIAL
Abs Immature Granulocytes: 0.02 10*3/uL (ref 0.00–0.07)
Basophils Absolute: 0 10*3/uL (ref 0.0–0.1)
Basophils Relative: 0 %
Eosinophils Absolute: 0 10*3/uL (ref 0.0–0.5)
Eosinophils Relative: 0 %
Immature Granulocytes: 0 %
Lymphocytes Relative: 12 %
Lymphs Abs: 1.1 10*3/uL (ref 0.7–4.0)
Monocytes Absolute: 0.6 10*3/uL (ref 0.1–1.0)
Monocytes Relative: 7 %
Neutro Abs: 7.5 10*3/uL (ref 1.7–7.7)
Neutrophils Relative %: 81 %

## 2022-11-01 LAB — URINE DRUG SCREEN, QUALITATIVE (ARMC ONLY)
Amphetamines, Ur Screen: NOT DETECTED
Barbiturates, Ur Screen: NOT DETECTED
Benzodiazepine, Ur Scrn: NOT DETECTED
Cannabinoid 50 Ng, Ur ~~LOC~~: NOT DETECTED
Cocaine Metabolite,Ur ~~LOC~~: NOT DETECTED
MDMA (Ecstasy)Ur Screen: NOT DETECTED
Methadone Scn, Ur: NOT DETECTED
Opiate, Ur Screen: NOT DETECTED
Phencyclidine (PCP) Ur S: NOT DETECTED
Tricyclic, Ur Screen: NOT DETECTED

## 2022-11-01 LAB — MAGNESIUM: Magnesium: 2 mg/dL (ref 1.7–2.4)

## 2022-11-01 LAB — PROTIME-INR
INR: 1 (ref 0.8–1.2)
Prothrombin Time: 13.8 seconds (ref 11.4–15.2)

## 2022-11-01 LAB — CK: Total CK: 14578 U/L — ABNORMAL HIGH (ref 49–397)

## 2022-11-01 LAB — APTT: aPTT: 27 seconds (ref 24–36)

## 2022-11-01 LAB — LIPASE, BLOOD: Lipase: 27 U/L (ref 11–51)

## 2022-11-01 LAB — ETHANOL: Alcohol, Ethyl (B): <10 mg/dL (ref <10)

## 2022-11-01 LAB — LACTIC ACID, PLASMA: Lactic Acid, Venous: 1.3 mmol/L (ref 0.5–1.9)

## 2022-11-01 LAB — CBG MONITORING, ED: Glucose-Capillary: 91 mg/dL (ref 70–99)

## 2022-11-01 LAB — D-DIMER, QUANTITATIVE: D-Dimer, Quant: 0.96 ug/mL-FEU — ABNORMAL HIGH (ref 0.00–0.50)

## 2022-11-01 MED ORDER — LACTATED RINGERS IV SOLN: INTRAVENOUS | Status: DC

## 2022-11-01 MED ORDER — ACETAMINOPHEN 650 MG RE SUPP: 650.0000 mg | Freq: Four times a day (QID) | RECTAL | Status: DC | PRN

## 2022-11-01 MED ORDER — PANTOPRAZOLE SODIUM 40 MG IV SOLR
40.0000 mg | Freq: Two times a day (BID) | INTRAVENOUS | Status: DC
  Administered 2022-11-01 – 2022-11-02 (×2): 40 mg via INTRAVENOUS
  Filled 2022-11-01 (×2): qty 10

## 2022-11-01 MED ORDER — NICOTINE 21 MG/24HR TD PT24
21.0000 mg | MEDICATED_PATCH | Freq: Every day | TRANSDERMAL | Status: DC
  Administered 2022-11-01 – 2022-11-22 (×21): 21 mg via TRANSDERMAL
  Filled 2022-11-01 (×21): qty 1

## 2022-11-01 MED ORDER — ACETAMINOPHEN 325 MG PO TABS
650.0000 mg | ORAL_TABLET | Freq: Four times a day (QID) | ORAL | Status: DC | PRN
  Administered 2022-11-02 – 2022-11-15 (×10): 650 mg via ORAL
  Filled 2022-11-01 (×10): qty 2

## 2022-11-01 MED ORDER — SODIUM BICARBONATE 650 MG PO TABS
650.0000 mg | ORAL_TABLET | ORAL | Status: DC
  Administered 2022-11-01: 650 mg via ORAL
  Filled 2022-11-01 (×2): qty 1

## 2022-11-01 MED ORDER — HEPARIN SODIUM (PORCINE) 5000 UNIT/ML IJ SOLN
5000.0000 [IU] | Freq: Three times a day (TID) | INTRAMUSCULAR | Status: DC
  Administered 2022-11-01: 5000 [IU] via SUBCUTANEOUS
  Filled 2022-11-01: qty 1

## 2022-11-01 MED ORDER — SODIUM CHLORIDE 0.9% FLUSH
3.0000 mL | Freq: Two times a day (BID) | INTRAVENOUS | Status: DC
  Administered 2022-11-01 – 2022-11-21 (×30): 3 mL via INTRAVENOUS

## 2022-11-01 MED ORDER — THIAMINE HCL 100 MG/ML IJ SOLN
100.0000 mg | Freq: Every day | INTRAMUSCULAR | Status: DC
  Administered 2022-11-01 – 2022-11-02 (×2): 100 mg via INTRAVENOUS
  Filled 2022-11-01 (×2): qty 2

## 2022-11-01 MED ORDER — ASPIRIN 325 MG PO TBEC
325.0000 mg | DELAYED_RELEASE_TABLET | Freq: Every day | ORAL | Status: DC
  Administered 2022-11-01: 325 mg via ORAL
  Filled 2022-11-01: qty 1

## 2022-11-01 NOTE — ED Triage Notes (Signed)
Pt to ED from home [facility] for AMS Confusion since today per family Also L side weakness and numbness, was dragging L foot with walking, decreased sensation. Family was unable to give time for LKW, family was poor historian  Pt has baseline slurred speech and R sided weakness from prior stroke  18g L forearm, 1000 mL IVF given en route 134/83, HR 72, RR 30, SPO2 99% RA, CBG 107, T 97.0  EDP Kinner calling code stroke. Kinner at bedside

## 2022-11-01 NOTE — H&P (Signed)
History and Physical    Patient: Corey Olson [id number] DOB: 1956/03/19 DOA: 11/01/2022 DOS: the patient was seen and examined on 11/01/2022 PCP: [name], MD  Patient coming from: Home  Chief Complaint:  Chief Complaint  Patient presents with   Altered Mental Status    HPI: Corey Olson is a 67 y.o. male with medical history significant for history of stroke with right-sided deficit at baseline, COPD, hypertension, alcohol abuse presenting to the emergency room for fall on Sunday. Initial report consisted of left-sided weakness and numbness and was dragging his left foot.  Initially sister that he lives with was not at bedside.  Patient was alert awake oriented and gave history.  Speech Is somewhat mildly muffled but patient states that is his baseline he does not have a lot of his teeth.  When asked about alcohol he states that he drinks beer but he does not drink heavy like that and Last drink was about 6 Saturday or Sunday.  In the emergency room patient initially had a code stroke that was called and patient was seen by neurology and code stroke was canceled. Patient denies any pain anywhere denies any headaches blurred vision any weakness he also did not report any leg pain to me.  No chest pain palpitation nausea vomiting diarrhea fevers chills or any other complaints otherwise no trouble with urination. Patient does smoke discussed with him about smoking cessation and his risk of strokes and increasing strokes and recurrent strokes secondary to his tobacco history.  In ED head ct was negative  and initial EKG read a.fib labs shows  abnormal lft/ metabolic acidosis with pco2 of 19. Pos dimer and elevated CPK. Normal cbc.  Pt given asa 324 x 1 and heparin 5000 units sub cut for DVT.  Pt off any anticoagulation he was on eliquis and DAPT in chart.  Pt then had doppler I did which showed pos dvt. Pt was also found to have very weak pulse left foot and we did cta runoff which is  pending.  Then I d/c then heparin consulted  pharmacy with plan for heparin gtt depending on cta aorta shows.   Review of Systems: Review of Systems  Neurological:  Positive for weakness.       Fall   All other systems reviewed and are negative.  Past Medical History:  Diagnosis Date   Alcohol use disorder, mild, abuse    COPD (chronic obstructive pulmonary disease) ([facility])    Gout    Hyperlipidemia    Hypertension    Hypothyroid    Peripheral vascular complication    [facility]    Past Surgical History:  Procedure Laterality Date   LOWER EXTREMITY ANGIOGRAPHY Right 08/25/2020   Procedure: LOWER EXTREMITY ANGIOGRAPHY;  Surgeon: Renford Dills, MD;  Location: [facility];  Service: Cardiovascular;  Laterality: Right;   LOWER EXTREMITY ANGIOGRAPHY Left 06/15/2021   Procedure: LOWER EXTREMITY ANGIOGRAPHY;  Surgeon: Renford Dills, MD;  Location: [facility];  Service: Cardiovascular;  Laterality: Left;   Social History:  reports that he has been smoking. He has a 50.00 pack-year smoking history. He has never used smokeless tobacco. He reports current alcohol use. He reports that he does not use drugs.  No Known Allergies  Family History  Problem Relation Age of Onset   Heart attack Mother    Heart Problems Father    Heart attack Father     Prior to Admission medications   Medication Sig  Start Date End Date Taking? Authorizing Provider  acetaminophen (TYLENOL) 500 MG tablet Take 500 mg by mouth every 6 (six) hours as needed for moderate pain or mild pain (lEG PAIN).   Yes [provider]  albuterol (VENTOLIN HFA) 108 (90 Base) MCG/ACT inhaler Inhale 2 puffs into the lungs every 6 (six) hours as needed for wheezing or shortness of breath.   Yes [provider]  allopurinol (ZYLOPRIM) 300 MG tablet Take 300 mg by mouth daily.   Yes [provider]  aspirin 81 MG EC tablet Take 325 mg by mouth daily.   Yes [provider]   atorvastatin (LIPITOR) 80 MG tablet Take 80 mg by mouth daily.   Yes [provider]  budesonide-formoterol (SYMBICORT) 160-4.5 MCG/ACT inhaler Inhale 2 puffs into the lungs daily. 01/28/15  Yes [provider]  gabapentin (NEURONTIN) 100 MG capsule Take 100 mg by mouth 2 (two) times daily.   Yes [provider]  lisinopril (ZESTRIL) 20 MG tablet Take 20 mg by mouth daily. 06/16/20  Yes [provider]  metoprolol succinate (TOPROL-XL) 50 MG 24 hr tablet Take 50 mg by mouth daily. 08/09/22  Yes [provider]  amLODipine (NORVASC) 10 MG tablet Take 10 mg by mouth daily. Patient not taking: Reported on 11/01/2022 01/28/15   [provider]  APIXABAN Everlene Balls) VTE STARTER PACK (10MG  AND 5MG ) Take as directed on package: start with two-5mg  tablets twice daily for 7 days. On day 8, switch to one-5mg  tablet twice daily. Patient not taking: Reported on 06/10/2021 09/11/20   Merwyn Katos, MD  clopidogrel (PLAVIX) 75 MG tablet Take 1 tablet (75 mg total) by mouth daily. Patient not taking: Reported on 06/10/2021 08/26/20   Schnier, Latina Craver, MD   Vitals:   11/01/22 1840 11/01/22 1841  BP: (!) 139/104   Pulse: 79   Resp: 20   SpO2: 96%   Weight:  72.9 kg   Physical Exam Vitals and nursing note reviewed.  Constitutional:      General: He is not in acute distress. HENT:     Head: Normocephalic and atraumatic.     Right Ear: Hearing and external ear normal.     Left Ear: Hearing and external ear normal.     Nose: Nose normal. No nasal deformity.     Mouth/Throat:     Lips: Pink.     Tongue: No lesions.     Pharynx: Oropharynx is clear.  Eyes:     General: Lids are normal.     Extraocular Movements: Extraocular movements intact.     Right eye: Normal extraocular motion.     Left eye: Normal extraocular motion.     Pupils: Pupils are equal, round, and reactive to light.  Cardiovascular:     Rate and Rhythm: Normal rate and regular rhythm.      Pulses:          Dorsalis pedis pulses are 2+ on the right side and 1+ on the left side.       Posterior tibial pulses are 2+ on the right side and 1+ on the left side.     Heart sounds: Murmur heard.  Pulmonary:     Effort: Pulmonary effort is normal.     Breath sounds: Normal breath sounds.  Abdominal:     General: Bowel sounds are normal. There is no distension.     Palpations: Abdomen is soft. There is no mass.     Tenderness: There is  no abdominal tenderness.  Musculoskeletal:     Right lower leg: No edema.     Left lower leg: No edema.     Comments: RUE weakness and contracture.   Skin:    General: Skin is warm.  Neurological:     Mental Status: He is alert and oriented to person, place, and time.     Cranial Nerves: Cranial nerves 2-12 are intact. No dysarthria or facial asymmetry.     Motor: Weakness present.     Deep Tendon Reflexes: Reflexes abnormal.     Reflex Scores:      Bicep reflexes are 1+ on the right side and 1+ on the left side.      Patellar reflexes are 1+ on the right side and 1+ on the left side. Psychiatric:        Attention and Perception: Attention normal.        Mood and Affect: Mood normal.        Speech: Speech normal.        Behavior: Behavior normal. Behavior is cooperative.   Labs on Admission: I have personally reviewed following labs and imaging studies  CBC: Recent Labs  Lab 11/01/22 1843  WBC 9.3  NEUTROABS 7.5  HGB 14.5  HCT 44.9  MCV 90.3  PLT 156   Basic Metabolic Panel: Recent Labs  Lab 11/01/22 1843 11/01/22 2001  NA 136  --   K 3.5  --   CL 102  --   CO2 19*  --   GLUCOSE 88  --   BUN 14  --   CREATININE 1.08  --   CALCIUM 8.5*  --   MG  --  2.0   GFR: CrCl cannot be calculated (Unknown ideal weight.). Liver Function Tests: Recent Labs  Lab 11/01/22 1843  AST 296*  ALT 63*  ALKPHOS 55  BILITOT 1.3*  PROT 7.0  ALBUMIN 4.2   Recent Labs  Lab 11/01/22 2001  LIPASE 27   No results for input(s):  "AMMONIA" in the last 168 hours. Coagulation Profile: Recent Labs  Lab 11/01/22 1843  INR 1.0   Cardiac Enzymes: Recent Labs  Lab 11/01/22 2001  CKTOTAL 14,578*   BNP (last 3 results) No results for input(s): "PROBNP" in the last 8760 hours. HbA1C: No results for input(s): "HGBA1C" in the last 72 hours. CBG: Recent Labs  Lab 11/01/22 1842  GLUCAP 91   Lipid Profile: No results for input(s): "CHOL", "HDL", "LDLCALC", "TRIG", "CHOLHDL", "LDLDIRECT" in the last 72 hours. Thyroid Function Tests: No results for input(s): "TSH", "T4TOTAL", "FREET4", "T3FREE", "THYROIDAB" in the last 72 hours. Anemia Panel: No results for input(s): "VITAMINB12", "FOLATE", "FERRITIN", "TIBC", "IRON", "RETICCTPCT" in the last 72 hours. Urine analysis:    Component Value Date/Time   COLORURINE YELLOW (A) 11/01/2022 2011   APPEARANCEUR CLEAR (A) 11/01/2022 2011   LABSPEC 1.023 11/01/2022 2011   PHURINE 5.0 11/01/2022 2011   GLUCOSEU NEGATIVE 11/01/2022 2011   HGBUR LARGE (A) 11/01/2022 2011   BILIRUBINUR NEGATIVE 11/01/2022 2011   KETONESUR 80 (A) 11/01/2022 2011   PROTEINUR 100 (A) 11/01/2022 2011   NITRITE NEGATIVE 11/01/2022 2011   LEUKOCYTESUR NEGATIVE 11/01/2022 2011   Radiological Exams on Admission: CT HEAD CODE STROKE WO CONTRAST  Result Date: 11/01/2022 CLINICAL DATA:  Code stroke. Altered mental status, confusion, left-sided weakness and numbness EXAM: CT HEAD WITHOUT CONTRAST TECHNIQUE: Contiguous axial images were obtained from the base of the skull through the vertex without intravenous contrast. RADIATION DOSE REDUCTION:  This exam was performed according to the departmental dose-optimization program which includes automated exposure control, adjustment of the mA and/or kV according to patient size and/or use of iterative reconstruction technique. COMPARISON:  11/22/2008 FINDINGS: Brain: No evidence of acute infarction, hemorrhage, mass, mass effect, or midline shift. No hydrocephalus  or extra-axial collection. 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 Vascular: No hyperdense vessel. Atherosclerotic calcifications in the intracranial carotid and vertebral arteries. Skull: Negative for fracture or focal lesion. Sinuses/Orbits: No acute finding. Other: The mastoid air cells are well aerated. ASPECTS [facility] Stroke Program Early CT Score) - Ganglionic level infarction (caudate, lentiform nuclei, internal capsule, insula, M1-M3 cortex): 7 - Supraganglionic infarction (M4-M6 cortex): 3 Total score (0-10 with 10 being normal): 10 IMPRESSION: 1. No acute intracranial process. 2. ASPECTS is 10 Code stroke imaging results were communicated on 11/01/2022 at 6:58 pm to provider [facility] via telephone, who verbally acknowledged these results. Electronically Signed   By: Wiliam Ke M.D.   On: 11/01/2022 18:59     Data Reviewed: Relevant notes from primary care and specialist visits, past discharge summaries as available in EHR, including Care Everywhere. Prior diagnostic testing as pertinent to current admission diagnoses Updated medications and problem lists for reconciliation ED course, including vitals, labs, imaging, treatment and response to treatment Triage notes, nursing and pharmacy notes and ED provider's notes Notable results as noted in HPI Assessment and Plan: Fall at home, initial encounter Differential diagnosis of patient's presentation includes TIA but also could be secondary to him being weak secondary to his alcohol use or nauseated electrolyte issues or dysrhythmias. Will place patient on fall precaution aspiration precaution seizure precaution.  Abnormal EKG On my exam HS were regular with occasional irregularity and not sure if it was a.fib or PVC.  EKG today shows EKG today shows A-fib however it is difficult to discern P waves due to artifact heart rate of 67 QTc of 460  right bundle branch block ST elevations in lead III but nonspecific will repeat the EKG to get a better picture.  Patient does not have a history of A-fib he does have a history of heart disease and PAD. Patient's last EKG in October 2022 shows sinus rhythm with prolonged PR interval of 222 first-degree AV block right bundle branch block with QTc of 461. > Patient's last echocardiogram was in 2022 done by [facility] health medical cardiology Dr. Prowse Mandril.Tang showing an ejection fraction of 50% noted that patient's Lexiscan did not show any ischemia possible old prior infarcts versus attenuation. Will repeat the EKG troponin is still pending.  Positive D dimer We will start with LE venous doppler. CTA chest to follow.  Suspect pt may have been on the floor and immobility is in questions.  Putting pt at hign risk for VTE.  TNI is pending.    Non-traumatic rhabdomyolysis Pt has rhabdomyolysis from fall at home and cpk of [address].  We will increase LR rate to 125 ml overnight.  Strict I/O. PO sodium bicarb for next 24 hours.   RUE weakness Patient also has right upper extremity weakness and contracture from previous stroke.  We will start patient on aspirin.  Statin therapy held secondary to elevated LFTs.  Alcohol abuse Alcohol level.  CIWA protocol. Thiamine.  Magnesium level. Patient noted to have AST of 296 and ALT of 63 today with a total bili of 1.3 otherwise normal. Suspect secondary to combination  of alcohol and rhabdo. Lipase is normal. We will however obtain a right upper quadrant ultrasound.  Essential hypertension Blood pressures is 139/104. Currently we will hold patient's amlodipine and lisinopril and metoprolol with systolic goals of 160s.   PAD (peripheral artery disease) (HCC) Reviewed case with pharmacy patient has been off of any anticoagulation including Eliquis aspirin or Plavix.  Tobacco abuse Nicotine patch and counseling once patient is baseline and  stable.        DVT prophylaxis:  Heparin  Consults:  Neurology  Advance Care Planning:    Code Status: Full Code   Family Communication:  None  Disposition Plan:  Back to previous home environment  Severity of Illness: The appropriate patient status for this patient is INPATIENT. Inpatient status is judged to be reasonable and necessary in order to provide the required intensity of service to ensure the patient's safety. The patient's presenting symptoms, physical exam findings, and initial radiographic and laboratory data in the context of their chronic comorbidities is felt to place them at high risk for further clinical deterioration. Furthermore, it is not anticipated that the patient will be medically stable for discharge from the hospital within 2 midnights of admission.   * I certify that at the point of admission it is my clinical judgment that the patient will require inpatient hospital care spanning beyond 2 midnights from the point of admission due to high intensity of service, high risk for further deterioration and high frequency of surveillance required.*  Author: Gertha Calkin, MD 11/01/2022 10:38 PM  For on call review [email or web address].

## 2022-11-01 NOTE — Assessment & Plan Note (Addendum)
Patient also has right upper extremity weakness and contracture from previous stroke.  We will start patient on aspirin.  Statin therapy held secondary to elevated LFTs.

## 2022-11-01 NOTE — Progress Notes (Signed)
1848 Stroke cart activated and elert sent to [facility]. EDP Cyril Loosen, MD) has assessed pt prior to cart activation and pt is in CT at this time. Pt presents to ED via EMS with c/o AMS and L side weakness with unknown LKW.  1853 Pt back in room from CT. Dr. Otelia Limes with [facility] paged at this time. 48 Dr. Otelia Limes at bedside assessing pt. Richard, RN called pt's sister for confirmation of LKW. Pt sister states LKW to be Sunday. 1916 Code stroke cancelled at this time by Dr. Otelia Limes d/t [phone]

## 2022-11-01 NOTE — ED Notes (Signed)
Dr. Otelia Limes has cancelled the code stroke.

## 2022-11-01 NOTE — ED Notes (Signed)
This nurse spoke with Sherren Kerns, the patient's sister.  She says that the last time she saw him normal was on Sunday, 10/30/22.  She says that the patient fell, and that after that, he stayed in bed.  On Monday, he would try to get up, and he would fall.  He was not better today, so they decided to send him to the hospital.

## 2022-11-01 NOTE — Assessment & Plan Note (Signed)
We will start with LE venous doppler. CTA chest to follow.  Suspect pt may have been on the floor and immobility is in questions.  Putting pt at hign risk for VTE.  TNI is pending.

## 2022-11-01 NOTE — Assessment & Plan Note (Signed)
Differential diagnosis of patient's presentation includes TIA but also could be secondary to him being weak secondary to his alcohol use or nauseated electrolyte issues or dysrhythmias. Will place patient on fall precaution aspiration precaution seizure precaution.

## 2022-11-01 NOTE — Assessment & Plan Note (Addendum)
Alcohol level.  CIWA protocol. Thiamine.  Magnesium level. Patient noted to have AST of 296 and ALT of 63 today with a total bili of 1.3 otherwise normal. Suspect secondary to combination of alcohol and rhabdo. Lipase is normal. We will however obtain a right upper quadrant ultrasound.

## 2022-11-01 NOTE — ED Notes (Signed)
Dr. Lindzen, neurologist, at bedside. 

## 2022-11-01 NOTE — Assessment & Plan Note (Signed)
Blood pressures is 139/104. Currently we will hold patient's amlodipine and lisinopril and metoprolol with systolic goals of 160s.

## 2022-11-01 NOTE — Consult Note (Signed)
NEURO HOSPITALIST CONSULT NOTE   Requestig physician: Dr. Cyril Loosen  Reason for Consult: Altered mental status  History obtained from:  Chart     HPI:                                                                                                                                          Corey Olson is a 67 y.o. male with a PMHx of alcohol use disorder, COPD, HLD, HTN, hypothyroidism and stroke with residual weakness who presents from home with AMS. LKN was Saturday per family discussion with RN by telephone at the time of Neurology evaluation. On Sunday he fell and has been mostly staying in bed since then due to having trouble getting up to ambulate on his own. As he was still not improving today, family decided to have him evaluated in the ED and called EMS. Family per ED staff were poor historians. They had also endorsed confusion, left sided weakness and numbness as well as dragging his left foot while walking and decreased sensation, all of which were new but without clear timelines as to onset per ED staff interview with family.   He has baseline slurred speech and right sided weakness from a prior stroke.   Listed home medications include apixaban, ASA, Plavix and atorvastatin.   Past Medical History:  Diagnosis Date   Alcohol use disorder, mild, abuse    COPD (chronic obstructive pulmonary disease) (HCC)    Gout    Hyperlipidemia    Hypertension    Hypothyroid    Peripheral vascular complication    Stroke [facility])     Past Surgical History:  Procedure Laterality Date   LOWER EXTREMITY ANGIOGRAPHY Right 08/25/2020   Procedure: LOWER EXTREMITY ANGIOGRAPHY;  Surgeon: Renford Dills, MD;  Location: [facility] INVASIVE CV LAB;  Service: Cardiovascular;  Laterality: Right;   LOWER EXTREMITY ANGIOGRAPHY Left 06/15/2021   Procedure: LOWER EXTREMITY ANGIOGRAPHY;  Surgeon: Renford Dills, MD;  Location: [facility] INVASIVE CV LAB;  Service: Cardiovascular;  Laterality: Left;     Family History  Problem Relation Age of Onset   Heart attack Mother    Heart Problems Father    Heart attack Father               Social History:  reports that he has been smoking. He has a 50.00 pack-year smoking history. He has never used smokeless tobacco. He reports current alcohol use. He reports that he does not use drugs.  No Known Allergies  MEDICATIONS:  No current facility-administered medications on file prior to encounter.   Current Outpatient Medications on File Prior to Encounter  Medication Sig Dispense Refill   acetaminophen (TYLENOL) 500 MG tablet Take 500 mg by mouth every 6 (six) hours as needed for moderate pain or mild pain (lEG PAIN). (Patient not taking: Reported on 06/15/2021)     albuterol (VENTOLIN HFA) 108 (90 Base) MCG/ACT inhaler Inhale 2 puffs into the lungs every 6 (six) hours as needed for wheezing or shortness of breath.     allopurinol (ZYLOPRIM) 300 MG tablet Take 300 mg by mouth daily.     amLODipine (NORVASC) 10 MG tablet Take 10 mg by mouth daily.     APIXABAN (ELIQUIS) VTE STARTER PACK (10MG  AND 5MG ) Take as directed on package: start with two-5mg  tablets twice daily for 7 days. On day 8, switch to one-5mg  tablet twice daily. (Patient not taking: Reported on 06/10/2021) 1 each 0   aspirin 81 MG EC tablet Take 81 mg by mouth daily.     atorvastatin (LIPITOR) 80 MG tablet Take 80 mg by mouth daily.     budesonide-formoterol (SYMBICORT) 160-4.5 MCG/ACT inhaler Inhale 2 puffs into the lungs daily.     clopidogrel (PLAVIX) 75 MG tablet Take 1 tablet (75 mg total) by mouth daily. (Patient not taking: Reported on 06/10/2021) 30 tablet 5   gabapentin (NEURONTIN) 100 MG capsule Take 100 mg by mouth 2 (two) times daily.     lisinopril (ZESTRIL) 20 MG tablet Take 20 mg by mouth daily.       ROS:                                                                                                                                        The patient is unable to provide a ROS due to AMS.    Blood pressure (!) 139/104, pulse 79, resp. rate 20, weight 72.9 kg, SpO2 96 %.   General Examination:                                                                                                       Physical Exam HEENT- [place]/AT   Lungs- Respirations unlabored Extremities- Warm and well-perfused. No edema.   Neurological Examination Mental Status: Awake and alert. Appears confused. Oriented to self, month, day of the week and that he is in the hospital, but cannot correctly identify the year, city, state, his age or the specific reason for being brought here. Speech is  sparse but not grossly dysfluent. Can name some objects but not others. Able to follow all basic motor commands and answer simple questions. No dysarthria.  Cranial Nerves: II: Temporal visual fields intact with no extinction to DSS. PERRL. III,IV, VI: No ptosis. EOMI with saccadic pursuits noted, worse when gazing to the left. No nystagmus. V: Unreliable responses when assessing temp sensation.  VII: Smile symmetric VIII: Hearing intact to voice IX,X: No hoarseness. Mildly hypophonic.  XI: Symmetric XII: Midline tongue extension Motor: RUE: with chronic mild flexion contracture at elbow, wrist and MCP joints, with chronic extension of digits more distally. Strength is 4-/5 deltoid, biceps and triceps, 2/5 wrist extension, 4-/5 grip. Unable to fully elevate at the shoulder antigravity. Increased RUE tone also noted.  LUE: 5/5 RLE: 4+/5 LLE: 5/5 Sensory: FT intact x 4. No extinction to DSS. Deep Tendon Reflexes: Refleses are brisker on the right relative to the left. Right toe upgoing, left toe mute.  Cerebellar: No ataxia with FNF on the left. Unable to fully perform on the right, but no gross RUE ataxia noted.  Gait: Deferred    Lab Results: Basic Metabolic Panel: No  results for input(s): "NA", "K", "CL", "CO2", "GLUCOSE", "BUN", "CREATININE", "CALCIUM", "MG", "PHOS" in the last 168 hours.  CBC: Recent Labs  Lab 11/01/22 1843  WBC 9.3  NEUTROABS 7.5  HGB 14.5  HCT 44.9  MCV 90.3  PLT 156    Cardiac Enzymes: No results for input(s): "CKTOTAL", "CKMB", "CKMBINDEX", "TROPONINI" in the last 168 hours.  Lipid Panel: No results for input(s): "CHOL", "TRIG", "HDL", "CHOLHDL", "VLDL", "LDLCALC" in the last 168 hours.  Imaging: No results found.   Assessment: 67 y.o. male with a PMHx of alcohol use disorder, COPD, HLD, HTN, hypothyroidism and stroke with residual weakness who presents from home with AMS. LKN was Saturday. On Sunday he fell and has been mostly staying in bed since then due to having trouble getting up to ambulate on his own. As he was still not improving today, family decided to have him evaluated in the ED and called EMS.  - Exam reveals a confused and partially oriented elderly male with sparse speech that is not grossly dysfluent. Can name some objects but not others. Able to follow all basic motor commands and answer simple questions. RUE with chronic mild flexion contracture at elbow, wrist and MCP joints, with motor weakness to resistance. RLE strength is rated at 4+/5 - CT head: Encephalomalacia in the left frontal lobe, most likely sequela of remote infarct, with ex vacuo dilatation of the left lateral ventricle. - Labs: - UDS negative.  - U/A not consistent with infection.  - Glucose normal - D-dimer elevated at 0.96 - CBC normal - Elevated CK at 14K - AST and ALT are elevated - BUN and Cr are normal - DDx for presentation includes toxic/metabolic/infectious encephalopathy and new stroke  Recommendations: - MRI brain - Toxic/metabolic/infectious work up - Further recommendations pending MRI brain    Electronically signed: Dr. Caryl Pina 11/01/2022, 7:00 PM

## 2022-11-01 NOTE — ED Notes (Signed)
Sister contacted by this RN. Sister endorsing she spoke with pt on the phone yesterday and "he said he was fine". She states she arrived at his house today at 1630 where she found him in his room with the door closed which she states is abnormal for him. She reports hx of a CVA with R sided weakness and slurred speech. She reports today pt was sitting on the side of the bed and was refusing to get up stating "every time I get up I fall". She states she called 911 because "the last time he acted this way he had a blood clot".

## 2022-11-01 NOTE — ED Notes (Signed)
Pt takes Plavix, Eliquis per chart and also 81mg  aspirin.

## 2022-11-01 NOTE — Assessment & Plan Note (Addendum)
Nicotine patch and counseling once patient is baseline and stable.

## 2022-11-01 NOTE — Assessment & Plan Note (Signed)
Pt has rhabdomyolysis from [place] [address].  We will increase LR rate to 125 ml overnight.  Strict I/O. PO sodium bicarb for next 24 hours.

## 2022-11-01 NOTE — ED Provider Notes (Signed)
[facility] Provider Note    Event Date/Time   First MD Initiated Contact with Patient 11/01/22 1834     (approximate)   History   Altered Mental Status   HPI  Corey Olson is a 67 y.o. male with a history of stroke, peripheral atrial disease who presents with altered mental status, left leg weakness.  Family spoke to him at 430 and felt that he was confused.  Prior to that he had not been spoken to since the night before when he had seemed normal.  Patient reports left leg weakness, has old right-sided weakness from prior CVA     Physical Exam   Triage Vital Signs: ED Triage Vitals  Enc Vitals Group     BP 11/01/22 1840 (!) 139/104     Pulse Rate 11/01/22 1840 79     Resp 11/01/22 1840 20     Temp --      Temp src --      SpO2 11/01/22 1840 96 %     Weight 11/01/22 1841 72.9 kg (160 lb 11.5 oz)     Height --      Head Circumference --      Peak Flow --      Pain Score --      Pain Loc --      Pain Edu? --      Excl. in GC? --     Most recent vital signs: Vitals:   11/01/22 1840  BP: (!) 139/104  Pulse: 79  Resp: 20  SpO2: 96%     General: Awake, no distress.  CV:  Good peripheral perfusion.  Resp:  Normal effort.  Abd:  No distention.  Other:  Able to lift left leg off the bed but it reports it feels heavy, questionable left facial droop   ED Results / Procedures / Treatments   Labs (all labs ordered are listed, but only abnormal results are displayed) Labs Reviewed  COMPREHENSIVE METABOLIC PANEL - Abnormal; Notable for the following components:      Result Value   CO2 19 (*)    Calcium 8.5 (*)    AST 296 (*)    ALT 63 (*)    Total Bilirubin 1.3 (*)    All other components within normal limits  PROTIME-INR  APTT  CBC  DIFFERENTIAL  URINE DRUG SCREEN, QUALITATIVE ([facility] ONLY)  URINALYSIS, ROUTINE W REFLEX MICROSCOPIC  ETHANOL  CBG MONITORING, ED     EKG  ED ECG REPORT I, Jene Every, the attending  physician, personally viewed and interpreted this ECG.  Date: 11/01/2022  Rhythm: Atrial fibrillation QRS Axis: normal Intervals: Abnormal ST/T Wave abnormalities: normal Narrative Interpretation: no evidence of acute ischemia    RADIOLOGY Contacted guarding CT head by radiology, no acute abnormality noted    PROCEDURES:  Critical Care performed: yes  CRITICAL CARE Performed by: Jene Every   Total critical care time: 30 minutes  Critical care time was exclusive of separately billable procedures and treating other patients.  Critical care was necessary to treat or prevent imminent or life-threatening deterioration.  Critical care was time spent personally by me on the following activities: development of treatment plan with patient and/or surrogate as well as nursing, discussions with consultants, evaluation of patient's response to treatment, examination of patient, obtaining history from patient or surrogate, ordering and performing treatments and interventions, ordering and review of laboratory studies, ordering and review of radiographic studies, pulse oximetry and re-evaluation of patient's  condition.   Procedures   MEDICATIONS ORDERED IN ED: Medications - No data to display   IMPRESSION / MDM / ASSESSMENT AND PLAN / ED COURSE  I reviewed the triage vital signs and the nursing notes. Patient's presentation is most consistent with acute presentation with potential threat to life or bodily function.  Patient presents with strokelike symptoms in the setting of prior CVA, left-sided weakness, some confusion, possible left-sided facial droop code stroke activated given possibility of LVO despite last known well unclear  Appreciate neurology consultation, they recommend admission to hospital service for MRI, stroke workup, no TPN as last known well is apparently greater than 24 hours  Work reviewed and is unremarkable.      FINAL CLINICAL IMPRESSION(S) / ED  DIAGNOSES   Final diagnoses:  Cerebrovascular accident (CVA), unspecified mechanism (HCC)     Rx / DC Orders   ED Discharge Orders     None        Note:  This document was prepared using Dragon voice recognition software and may include unintentional dictation errors.   Jene Every, MD 11/01/22 Serena Croissant

## 2022-11-01 NOTE — Assessment & Plan Note (Signed)
On my exam HS were regular with occasional irregularity and not sure if it was a.fib or PVC.  EKG today shows EKG today shows A-fib however it is difficult to discern P waves due to artifact heart rate of 67 QTc of 460 right bundle branch block ST elevations in lead III but nonspecific will repeat the EKG to get a better picture.  Patient does not have a history of A-fib he does have a history of heart disease and PAD. Patient's last EKG in October 2022 shows sinus rhythm with prolonged PR interval of 222 first-degree AV block right bundle branch block with QTc of 461. > Patient's last echocardiogram was in 2022 done by [facility] health medical cardiology Dr. Hard Mandril.Tang showing an ejection fraction of 50% noted that patient's Lexiscan did not show any ischemia possible old prior infarcts versus attenuation. Will repeat the EKG troponin is still pending.

## 2022-11-01 NOTE — Assessment & Plan Note (Signed)
Reviewed case with pharmacy patient has been off of any anticoagulation including Eliquis aspirin or Plavix.

## 2022-11-01 NOTE — ED Notes (Signed)
Sister of pt saw pt today at 70 and pt was altered. Pt was not seen today other than that and was normal yesterday. Per EDP Kinner LKW should be 1630 today.

## 2022-11-02 ENCOUNTER — Encounter
Admission: EM | Disposition: A | Discharge status: Skilled Nursing Facility | Payer: Self-pay | Source: Home / Self Care | Attending: Osteopathic Medicine

## 2022-11-02 ENCOUNTER — Inpatient Hospital Stay: Payer: Medicare Other

## 2022-11-02 ENCOUNTER — Observation Stay: Payer: Medicare Other

## 2022-11-02 ENCOUNTER — Encounter: Payer: Self-pay | Admitting: Internal Medicine

## 2022-11-02 ENCOUNTER — Other Ambulatory Visit: Payer: Self-pay

## 2022-11-02 DIAGNOSIS — Y713 Surgical instruments, materials and cardiovascular devices (including sutures) associated with adverse incidents: Secondary | ICD-10-CM | POA: Diagnosis not present

## 2022-11-02 DIAGNOSIS — I69351 Hemiplegia and hemiparesis following cerebral infarction affecting right dominant side: Secondary | ICD-10-CM | POA: Diagnosis not present

## 2022-11-02 DIAGNOSIS — R6510 Systemic inflammatory response syndrome (SIRS) of non-infectious origin without acute organ dysfunction: Secondary | ICD-10-CM | POA: Diagnosis not present

## 2022-11-02 DIAGNOSIS — I82412 Acute embolism and thrombosis of left femoral vein: Secondary | ICD-10-CM | POA: Diagnosis present

## 2022-11-02 DIAGNOSIS — R4182 Altered mental status, unspecified: Secondary | ICD-10-CM | POA: Diagnosis present

## 2022-11-02 DIAGNOSIS — I70223 Atherosclerosis of native arteries of extremities with rest pain, bilateral legs: Secondary | ICD-10-CM | POA: Diagnosis present

## 2022-11-02 DIAGNOSIS — I82432 Acute embolism and thrombosis of left popliteal vein: Secondary | ICD-10-CM | POA: Diagnosis present

## 2022-11-02 DIAGNOSIS — W19XXXA Unspecified fall, initial encounter: Secondary | ICD-10-CM | POA: Diagnosis not present

## 2022-11-02 DIAGNOSIS — Z515 Encounter for palliative care: Secondary | ICD-10-CM | POA: Diagnosis not present

## 2022-11-02 DIAGNOSIS — I998 Other disorder of circulatory system: Secondary | ICD-10-CM | POA: Diagnosis not present

## 2022-11-02 DIAGNOSIS — Z720 Tobacco use: Secondary | ICD-10-CM

## 2022-11-02 DIAGNOSIS — E872 Acidosis, unspecified: Secondary | ICD-10-CM | POA: Diagnosis present

## 2022-11-02 DIAGNOSIS — I25118 Atherosclerotic heart disease of native coronary artery with other forms of angina pectoris: Secondary | ICD-10-CM | POA: Diagnosis not present

## 2022-11-02 DIAGNOSIS — T148XXA Other injury of unspecified body region, initial encounter: Secondary | ICD-10-CM | POA: Diagnosis not present

## 2022-11-02 DIAGNOSIS — R9431 Abnormal electrocardiogram [ECG] [EKG]: Secondary | ICD-10-CM

## 2022-11-02 DIAGNOSIS — Y838 Other surgical procedures as the cause of abnormal reaction of the patient, or of later complication, without mention of misadventure at the time of the procedure: Secondary | ICD-10-CM | POA: Diagnosis not present

## 2022-11-02 DIAGNOSIS — I97638 Postprocedural hematoma of a circulatory system organ or structure following other circulatory system procedure: Secondary | ICD-10-CM | POA: Diagnosis not present

## 2022-11-02 DIAGNOSIS — M6282 Rhabdomyolysis: Secondary | ICD-10-CM | POA: Diagnosis present

## 2022-11-02 DIAGNOSIS — I10 Essential (primary) hypertension: Secondary | ICD-10-CM

## 2022-11-02 DIAGNOSIS — I70213 Atherosclerosis of native arteries of extremities with intermittent claudication, bilateral legs: Secondary | ICD-10-CM | POA: Diagnosis not present

## 2022-11-02 DIAGNOSIS — I745 Embolism and thrombosis of iliac artery: Secondary | ICD-10-CM | POA: Diagnosis not present

## 2022-11-02 DIAGNOSIS — I2510 Atherosclerotic heart disease of native coronary artery without angina pectoris: Secondary | ICD-10-CM | POA: Diagnosis not present

## 2022-11-02 DIAGNOSIS — I743 Embolism and thrombosis of arteries of the lower extremities: Secondary | ICD-10-CM | POA: Diagnosis not present

## 2022-11-02 DIAGNOSIS — K760 Fatty (change of) liver, not elsewhere classified: Secondary | ICD-10-CM | POA: Diagnosis present

## 2022-11-02 DIAGNOSIS — Z86718 Personal history of other venous thrombosis and embolism: Secondary | ICD-10-CM | POA: Diagnosis not present

## 2022-11-02 DIAGNOSIS — R29898 Other symptoms and signs involving the musculoskeletal system: Secondary | ICD-10-CM

## 2022-11-02 DIAGNOSIS — I70222 Atherosclerosis of native arteries of extremities with rest pain, left leg: Secondary | ICD-10-CM

## 2022-11-02 DIAGNOSIS — Z79899 Other long term (current) drug therapy: Secondary | ICD-10-CM | POA: Diagnosis not present

## 2022-11-02 DIAGNOSIS — Z9889 Other specified postprocedural states: Secondary | ICD-10-CM | POA: Diagnosis not present

## 2022-11-02 DIAGNOSIS — F1010 Alcohol abuse, uncomplicated: Secondary | ICD-10-CM | POA: Diagnosis not present

## 2022-11-02 DIAGNOSIS — I70201 Unspecified atherosclerosis of native arteries of extremities, right leg: Secondary | ICD-10-CM

## 2022-11-02 DIAGNOSIS — I739 Peripheral vascular disease, unspecified: Secondary | ICD-10-CM

## 2022-11-02 DIAGNOSIS — D62 Acute posthemorrhagic anemia: Secondary | ICD-10-CM | POA: Diagnosis not present

## 2022-11-02 DIAGNOSIS — Z95828 Presence of other vascular implants and grafts: Secondary | ICD-10-CM | POA: Diagnosis not present

## 2022-11-02 DIAGNOSIS — Z7189 Other specified counseling: Secondary | ICD-10-CM | POA: Diagnosis not present

## 2022-11-02 DIAGNOSIS — E039 Hypothyroidism, unspecified: Secondary | ICD-10-CM | POA: Diagnosis present

## 2022-11-02 DIAGNOSIS — J449 Chronic obstructive pulmonary disease, unspecified: Secondary | ICD-10-CM | POA: Diagnosis present

## 2022-11-02 DIAGNOSIS — Z8249 Family history of ischemic heart disease and other diseases of the circulatory system: Secondary | ICD-10-CM | POA: Diagnosis not present

## 2022-11-02 DIAGNOSIS — E785 Hyperlipidemia, unspecified: Secondary | ICD-10-CM | POA: Diagnosis present

## 2022-11-02 DIAGNOSIS — Y92009 Unspecified place in unspecified non-institutional (private) residence as the place of occurrence of the external cause: Secondary | ICD-10-CM

## 2022-11-02 DIAGNOSIS — F17210 Nicotine dependence, cigarettes, uncomplicated: Secondary | ICD-10-CM | POA: Diagnosis present

## 2022-11-02 DIAGNOSIS — I4891 Unspecified atrial fibrillation: Secondary | ICD-10-CM | POA: Diagnosis present

## 2022-11-02 DIAGNOSIS — I701 Atherosclerosis of renal artery: Secondary | ICD-10-CM | POA: Diagnosis present

## 2022-11-02 DIAGNOSIS — I639 Cerebral infarction, unspecified: Secondary | ICD-10-CM | POA: Diagnosis not present

## 2022-11-02 DIAGNOSIS — E876 Hypokalemia: Secondary | ICD-10-CM | POA: Diagnosis present

## 2022-11-02 DIAGNOSIS — Z7901 Long term (current) use of anticoagulants: Secondary | ICD-10-CM | POA: Diagnosis not present

## 2022-11-02 DIAGNOSIS — R7989 Other specified abnormal findings of blood chemistry: Secondary | ICD-10-CM

## 2022-11-02 DIAGNOSIS — F10139 Alcohol abuse with withdrawal, unspecified: Secondary | ICD-10-CM | POA: Diagnosis not present

## 2022-11-02 DIAGNOSIS — I97630 Postprocedural hematoma of a circulatory system organ or structure following a cardiac catheterization: Secondary | ICD-10-CM | POA: Diagnosis not present

## 2022-11-02 DIAGNOSIS — I429 Cardiomyopathy, unspecified: Secondary | ICD-10-CM | POA: Diagnosis present

## 2022-11-02 HISTORY — PX: LOWER EXTREMITY ANGIOGRAPHY: CATH118251

## 2022-11-02 LAB — COMPREHENSIVE METABOLIC PANEL
ALT: 74 U/L — ABNORMAL HIGH (ref 0–44)
AST: 366 U/L — ABNORMAL HIGH (ref 15–41)
Albumin: 3.7 g/dL (ref 3.5–5.0)
Alkaline Phosphatase: 57 U/L (ref 38–126)
Anion gap: 14 (ref 5–15)
BUN: 12 mg/dL (ref 8–23)
CO2: 21 mmol/L — ABNORMAL LOW (ref 22–32)
Calcium: 7.8 mg/dL — ABNORMAL LOW (ref 8.9–10.3)
Chloride: 104 mmol/L (ref 98–111)
Creatinine, Ser: 0.98 mg/dL (ref 0.61–1.24)
GFR, Estimated: >60 mL/min (ref >60)
Glucose, Bld: 84 mg/dL (ref 70–99)
Potassium: 3.4 mmol/L — ABNORMAL LOW (ref 3.5–5.1)
Sodium: 139 mmol/L (ref 135–145)
Total Bilirubin: 1.6 mg/dL — ABNORMAL HIGH (ref 0.3–1.2)
Total Protein: 6.6 g/dL (ref 6.5–8.1)

## 2022-11-02 LAB — HEPARIN LEVEL (UNFRACTIONATED)
Heparin Unfractionated: >1.1 IU/mL — ABNORMAL HIGH (ref 0.30–0.70)
Heparin Unfractionated: 0.98 IU/mL — ABNORMAL HIGH (ref 0.30–0.70)

## 2022-11-02 LAB — CBC
HCT: 41.8 % (ref 39.0–52.0)
Hemoglobin: 13.5 g/dL (ref 13.0–17.0)
MCH: 29.1 pg (ref 26.0–34.0)
MCHC: 32.3 g/dL (ref 30.0–36.0)
MCV: 90.1 fL (ref 80.0–100.0)
Platelets: 159 10*3/uL (ref 150–400)
RBC: 4.64 MIL/uL (ref 4.22–5.81)
RDW: 14.6 % (ref 11.5–15.5)
WBC: 9.7 10*3/uL (ref 4.0–10.5)
nRBC: 0 % (ref 0.0–0.2)

## 2022-11-02 LAB — HEPATITIS PANEL, ACUTE
HCV Ab: NONREACTIVE
Hep A IgM: NONREACTIVE
Hep B C IgM: NONREACTIVE

## 2022-11-02 LAB — CBG MONITORING, ED: Glucose-Capillary: 84 mg/dL (ref 70–99)

## 2022-11-02 LAB — HIV ANTIBODY (ROUTINE TESTING W REFLEX): HIV Screen 4th Generation wRfx: NONREACTIVE

## 2022-11-02 LAB — LACTIC ACID, PLASMA: Lactic Acid, Venous: 1.6 mmol/L (ref 0.5–1.9)

## 2022-11-02 SURGERY — LOWER EXTREMITY ANGIOGRAPHY
Anesthesia: Moderate Sedation | Laterality: Left

## 2022-11-02 MED ORDER — HYDROMORPHONE HCL 1 MG/ML IJ SOLN: 1.0000 mg | Freq: Once | INTRAMUSCULAR | Status: DC | PRN

## 2022-11-02 MED ORDER — LACTATED RINGERS IV BOLUS
250.0000 mL | Freq: Once | INTRAVENOUS | Status: AC
  Administered 2022-11-02: 250 mL via INTRAVENOUS

## 2022-11-02 MED ORDER — METHYLPREDNISOLONE SODIUM SUCC 125 MG IJ SOLR: 125.0000 mg | Freq: Once | INTRAMUSCULAR | Status: DC | PRN

## 2022-11-02 MED ORDER — HEPARIN (PORCINE) 25000 UT/250ML-% IV SOLN
INTRAVENOUS | Status: AC
  Administered 2022-11-02: 950 [IU]/h via INTRAVENOUS
  Filled 2022-11-02: qty 250

## 2022-11-02 MED ORDER — IOHEXOL 350 MG/ML SOLN
100.0000 mL | Freq: Once | INTRAVENOUS | Status: AC | PRN
  Administered 2022-11-02: 100 mL via INTRAVENOUS

## 2022-11-02 MED ORDER — THIAMINE MONONITRATE 100 MG PO TABS
100.0000 mg | ORAL_TABLET | Freq: Every day | ORAL | Status: DC
  Administered 2022-11-03 – 2022-11-22 (×19): 100 mg via ORAL
  Filled 2022-11-02 (×19): qty 1

## 2022-11-02 MED ORDER — HEPARIN SODIUM (PORCINE) 1000 UNIT/ML IJ SOLN
INTRAMUSCULAR | Status: AC
  Filled 2022-11-02: qty 10

## 2022-11-02 MED ORDER — SODIUM CHLORIDE 0.9 % IV SOLN: INTRAVENOUS | Status: DC

## 2022-11-02 MED ORDER — MIDAZOLAM HCL 5 MG/5ML IJ SOLN
INTRAMUSCULAR | Status: AC
  Filled 2022-11-02: qty 5

## 2022-11-02 MED ORDER — FENTANYL CITRATE PF 50 MCG/ML IJ SOSY
PREFILLED_SYRINGE | INTRAMUSCULAR | Status: AC
  Filled 2022-11-02: qty 2

## 2022-11-02 MED ORDER — MIDAZOLAM HCL 2 MG/ML PO SYRP
8.0000 mg | ORAL_SOLUTION | Freq: Once | ORAL | Status: DC | PRN
  Filled 2022-11-02: qty 5

## 2022-11-02 MED ORDER — IODIXANOL 320 MG/ML IV SOLN
INTRAVENOUS | Status: DC | PRN
  Administered 2022-11-02: 50 mL via INTRA_ARTERIAL

## 2022-11-02 MED ORDER — CEFAZOLIN SODIUM-DEXTROSE 2-4 GM/100ML-% IV SOLN
INTRAVENOUS | Status: AC
  Filled 2022-11-02: qty 100

## 2022-11-02 MED ORDER — PANTOPRAZOLE SODIUM 40 MG PO TBEC
40.0000 mg | DELAYED_RELEASE_TABLET | Freq: Two times a day (BID) | ORAL | Status: DC
  Administered 2022-11-02 – 2022-11-22 (×38): 40 mg via ORAL
  Filled 2022-11-02 (×39): qty 1

## 2022-11-02 MED ORDER — ONDANSETRON HCL 4 MG/2ML IJ SOLN: 4.0000 mg | Freq: Four times a day (QID) | INTRAMUSCULAR | Status: DC | PRN

## 2022-11-02 MED ORDER — FENTANYL CITRATE (PF) 100 MCG/2ML IJ SOLN
INTRAMUSCULAR | Status: DC | PRN
  Administered 2022-11-02: 50 ug via INTRAVENOUS

## 2022-11-02 MED ORDER — HEPARIN (PORCINE) 25000 UT/250ML-% IV SOLN
1150.0000 [IU]/h | INTRAVENOUS | Status: DC
  Administered 2022-11-02: 1150 [IU]/h via INTRAVENOUS
  Filled 2022-11-02: qty 250

## 2022-11-02 MED ORDER — SODIUM CHLORIDE FLUSH 0.9 % IV SOLN
INTRAVENOUS | Status: AC
  Filled 2022-11-02: qty 40

## 2022-11-02 MED ORDER — HEPARIN (PORCINE) IN NACL 1000-0.9 UT/500ML-% IV SOLN
INTRAVENOUS | Status: DC | PRN
  Administered 2022-11-02: 1000 mL

## 2022-11-02 MED ORDER — HEPARIN (PORCINE) 25000 UT/250ML-% IV SOLN
700.0000 [IU]/h | INTRAVENOUS | Status: DC
  Administered 2022-11-03 – 2022-11-06 (×3): 700 [IU]/h via INTRAVENOUS
  Filled 2022-11-02 (×2): qty 250

## 2022-11-02 MED ORDER — FENTANYL CITRATE PF 50 MCG/ML IJ SOSY: 12.5000 ug | PREFILLED_SYRINGE | Freq: Once | INTRAMUSCULAR | Status: DC | PRN

## 2022-11-02 MED ORDER — MIDAZOLAM HCL 2 MG/2ML IJ SOLN
INTRAMUSCULAR | Status: DC | PRN
  Administered 2022-11-02: 1 mg via INTRAVENOUS

## 2022-11-02 MED ORDER — CEFAZOLIN SODIUM-DEXTROSE 2-4 GM/100ML-% IV SOLN
2.0000 g | INTRAVENOUS | Status: AC
  Administered 2022-11-02: 2 g via INTRAVENOUS

## 2022-11-02 MED ORDER — FAMOTIDINE 20 MG PO TABS: 40.0000 mg | ORAL_TABLET | Freq: Once | ORAL | Status: DC | PRN

## 2022-11-02 MED ORDER — LACTATED RINGERS IV SOLN: INTRAVENOUS | Status: AC

## 2022-11-02 MED ORDER — DIPHENHYDRAMINE HCL 50 MG/ML IJ SOLN: 50.0000 mg | Freq: Once | INTRAMUSCULAR | Status: DC | PRN

## 2022-11-02 SURGICAL SUPPLY — 9 items
CANNULA 5F STIFF (CANNULA) IMPLANT
CATH ANGIO 5F PIGTAIL 65CM (CATHETERS) IMPLANT
COVER PROBE ULTRASOUND 5X96 (MISCELLANEOUS) IMPLANT
DEVICE STARCLOSE SE CLOSURE (Vascular Products) IMPLANT
PACK ANGIOGRAPHY (CUSTOM PROCEDURE TRAY) ×1 IMPLANT
SHEATH BRITE TIP 5FRX11 (SHEATH) IMPLANT
SYR MEDRAD MARK 7 150ML (SYRINGE) IMPLANT
TUBING CONTRAST HIGH PRESS 72 (TUBING) IMPLANT
WIRE GUIDERIGHT .035X150 (WIRE) IMPLANT

## 2022-11-02 NOTE — Progress Notes (Signed)
ANTICOAGULATION CONSULT NOTE  Pharmacy Consult for heparin infusion Indication: DVT  No Known Allergies  Patient Measurements: Height: 5\' 7"  (170.2 cm) Weight: 72.9 kg (160 lb 11.5 oz) IBW/kg (Calculated) : 66.1 Heparin Dosing Weight: 72.9 kg  Vital Signs: Temp: 98.1 F (36.7 C) (06/12 0900) Temp Source: Oral (06/12 0900) BP: 148/64 (06/12 1100) Pulse Rate: 69 (06/12 1100)  Labs: Recent Labs    11/01/22 1843 11/01/22 2001 11/02/22 0625 11/02/22 1315  HGB 14.5  --  13.5  --   HCT 44.9  --  41.8  --   PLT 156  --  159  --   APTT 27  --   --   --   LABPROT 13.8  --   --   --   INR 1.0  --   --   --   HEPARINUNFRC  --   --   --  >1.10*  CREATININE 1.08  --  0.98  --   CKTOTAL  --  14,578*  --   --      Estimated Creatinine Clearance: 68.4 mL/min (by C-G formula based on SCr of 0.98 mg/dL).   Medical History: Past Medical History:  Diagnosis Date   Alcohol use disorder, mild, abuse    COPD (chronic obstructive pulmonary disease) (HCC)    Gout    Hyperlipidemia    Hypertension    Hypothyroid    Peripheral vascular complication    Stroke [facility])     Assessment: Pt is a 67 yo male presenting to ED for fall and c/o left leg pain, found with multiple arterial occlusions in lower extremities.Pt w/ med hx of Eliquis in the past, but stated he has not been taking medication recently.  Date Time HL 6/12 1315  >1.1  Goal of Therapy:  Heparin level 0.3-0.7 units/ml Monitor platelets by anticoagulation protocol: Yes   Plan:  Hold heparin infusion for 1 hours the resume at 950 units/hr Check HL every 6 hr after start of infusion till therapeutic x 2. CBC daily while on heparin  Francetta Found, PharmD Candidate Class of 2025  11/02/2022 1:42 PM

## 2022-11-02 NOTE — ED Notes (Addendum)
Hospitalist (Dr. Lyn Hollingshead) messaged due to pt having bloody urine. See orders. Report given to specialties RN

## 2022-11-02 NOTE — Progress Notes (Signed)
Abnormal CTA report reviewed- AM team to request vascular consult.  We will start pt on heparin gtt for left leg dVT.  Pharmacy and nurse both aware.

## 2022-11-02 NOTE — Progress Notes (Signed)
ANTICOAGULATION CONSULT NOTE  Pharmacy Consult for heparin infusion Indication: DVT  No Known Allergies  Patient Measurements: Weight: 72.9 kg (160 lb 11.5 oz) Heparin Dosing Weight: 72.9 kg  Vital Signs: Temp: 97.5 F (36.4 C) (06/11 2325) Temp Source: Oral (06/11 2325) BP: 135/73 (06/12 0230) Pulse Rate: 88 (06/12 0230)  Labs: Recent Labs    11/01/22 1843 11/01/22 2001  HGB 14.5  --   HCT 44.9  --   PLT 156  --   APTT 27  --   LABPROT 13.8  --   INR 1.0  --   CREATININE 1.08  --   CKTOTAL  --  14,578*    CrCl cannot be calculated (Unknown ideal weight.).   Medical History: Past Medical History:  Diagnosis Date   Alcohol use disorder, mild, abuse    COPD (chronic obstructive pulmonary disease) (HCC)    Gout    Hyperlipidemia    Hypertension    Hypothyroid    Peripheral vascular complication    Stroke [facility])     Assessment: Pt is a 67 yo male presenting to ED for fall and c/o left leg pain, found with multiple arterial occlusions in lower extremities.Pt w/ med hx of Eliquis in the past, but stated he has not been taking medication recently.   Goal of Therapy:  Heparin level 0.3-0.7 units/ml Monitor platelets by anticoagulation protocol: Yes   Plan:  Pt given heparin 5000 units sub-q 6/11 @ 2320 Start heparin infusion at 1150 units/hr Will check HL/aPTT/INR in 6 hr after start of infusion CBC daily while on heparin  Otelia Sergeant, PharmD, [facility] 11/02/2022 3:31 AM

## 2022-11-02 NOTE — Op Note (Signed)
[facility]  Percutaneous Study/Intervention Procedural Note   Date of Surgery: 11/02/2022  Surgeon(s):Koren Sermersheim    Assistants:none  Pre-operative Diagnosis: PAD with rest pain and neurologic changes in the left lower extremity  Post-operative diagnosis:  Same  Procedure(s) Performed:             1.  Ultrasound guidance for vascular access right femoral artery             2.  Catheter placement into aorta from right femoral approach             3.  Aortogram and selective bilateral lower extremity angiograms             4.  StarClose closure device right femoral artery  EBL: 5 cc  Contrast: 50 cc  Fluoro Time: 1 minute  Moderate Conscious Sedation Time: approximately 21 minutes using 1 mg of Versed and 50 mcg of Fentanyl              Indications:  Patient is a 67 y.o.male with profound left lower extremity ischemia with neurologic changes as well as a known history of severe peripheral arterial disease bilaterally.  The patient had a suboptimal CT scan which showed extensive disease throughout. The patient is brought in for angiography for further evaluation and potential treatment.  Due to the limb threatening nature of the situation, angiogram was performed for attempted limb salvage. The patient is aware that if the procedure fails, amputation would be expected.  The patient also understands that even with successful revascularization, amputation may still be required due to the severity of the situation.  Risks and benefits are discussed and informed consent is obtained.   Procedure:  The patient was identified and appropriate procedural time out was performed.  The patient was then placed supine on the table and prepped and draped in the usual sterile fashion. Moderate conscious sedation was administered during a face to face encounter with the patient throughout the procedure with my supervision of the RN administering medicines and monitoring the patient's  vital signs, pulse oximetry, telemetry and mental status throughout from the start of the procedure until the patient was taken to the recovery room. Ultrasound was used to evaluate the right common femoral artery.  It was patent but heavily diseased.  A digital ultrasound image was acquired.  A Seldinger needle was used to access the right common femoral artery under direct ultrasound guidance and a permanent image was performed.  A 0.035 J wire was advanced without resistance and a 5Fr sheath was placed.  Pigtail catheter was placed into the aorta and an AP aortogram was performed. This demonstrated greater than 60% stenosis in both renal arteries.  The aorta appeared relatively normal.  The right iliac arteries with the previously placed stents were patent with minimal disease.  The left common iliac artery was occluded at its origin.  The left external iliac artery and common femoral artery remained occluded. It was clear this was a dire situation with almost certain limb loss on the left side.  Imaging was performed through the pigtail catheter in the aorta of the left lower extremity.  Selective left lower extremity angiogram was then performed. This demonstrated a small amount of reconstituted flow in the left common femoral artery but this was essentially occluded.  The profunda femoris artery was occluded proximally with reconstitution through collaterals of the diseased mid to distal profunda femoris artery.  The SFA was chronically occluded.  No distal  flow or reconstitution was identified in the popliteal or tibial vessels.  I then imaged the right lower extremity through the right femoral sheath.  The right common femoral artery had a near occlusive stenosis in the midsegment and then appeared to occlude distally in the proximal portion of the profunda femoris artery was occluded reconstituting through collaterals and providing flow distally.  The SFA had a long segment occlusion down to the popliteal  artery.  Although the flow was faint, there appeared to be two-vessel runoff distally once the popliteal reconstituted.  I elected to terminate the procedure. The sheath was removed and StarClose closure device was deployed in the right femoral artery with excellent hemostatic result. The patient was taken to the recovery room in stable condition having tolerated the procedure well.  Findings:               Aortogram: This demonstrated greater than 60% stenosis in both renal arteries.  The aorta appeared relatively normal.  The right iliac arteries with the previously placed stents were patent with minimal disease.  The left common iliac artery was occluded at its origin.  The left external iliac artery and common femoral artery remained occluded.             Left Lower Extremity:  This demonstrated a small amount of reconstituted flow in the left common femoral artery but this was essentially occluded.  The profunda femoris artery was occluded proximally with reconstitution through collaterals of the diseased mid to distal profunda femoris artery.  The SFA was chronically occluded.  No distal flow or reconstitution was identified in the popliteal or tibial vessels.  Right Lower Extremity:  The right common femoral artery had a near occlusive stenosis in the midsegment and then appeared to occlude distally in the proximal portion of the profunda femoris artery was occluded reconstituting through collaterals and providing flow distally.  The SFA had a long segment occlusion down to the popliteal artery.  Although the flow was faint, there appeared to be two-vessel runoff distally once the popliteal reconstituted.    Disposition: Patient was taken to the recovery room in stable condition having tolerated the procedure well.  Complications: None  [name] 11/02/2022 4:08 PM   This note was created with Dragon Medical transcription system. Any errors in dictation are purely unintentional.

## 2022-11-02 NOTE — Interval H&P Note (Signed)
History and Physical Interval Note:  11/02/2022 3:23 PM  Corey Olson  has presented today for surgery, with the diagnosis of PAD.  The various methods of treatment have been discussed with the patient and family. After consideration of risks, benefits and other options for treatment, the patient has consented to  Procedure(s): Lower Extremity Angiography (Left) as a surgical intervention.  The patient's history has been reviewed, patient examined, no change in status, stable for surgery.  I have reviewed the patient's chart and labs.  Questions were answered to the patient's satisfaction.     [name]

## 2022-11-02 NOTE — Hospital Course (Addendum)
Corey Olson is a 67 y.o. male with a history of stroke, peripheral atrial disease who presents with altered mental status, left leg weakness.  Family spoke to him at 430 and felt that he was confused.  Prior to that he had not been spoken to since the night before when he had seemed normal.  Patient reports left leg weakness dragging his left foot while walking and decreased sensation, all of which were new but without clear timelines. 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  06/11: in ED, VSS, code stroke. Admitted to hospitalist service. Elevated AST/ALT (+)hepatic steatosis, rhabdo, (+)DVT L femoral and popliteal V.  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 Found to have weakneed pulses LLE --> ischemia/severe PAD LLE, heparin gtt, RLE and LLE angiogram. Significant vascular disease, limb likely not salvageable per Dr Wyn Quaker - prognosis poor for even AKA healing, will likely need extensive endarterectomy/stenting to even salvage an AKA. Can plan for surgery 06/13: anticipate surgery tomorrow. Cardiac clearance.  06/14: endarterectomy/stents bilateral femoral  06/15: recovering in ICU on heparin infusion. Sinus tach w/ elevated temp, (+)SIRS, (+)UA concern for sepsis d/t UTI vs SIRS w/ other medical issues and also eval for PE was neg.  06/16: 1 unit PRBC, CT LLE (+)hematoma/pseudoaneurysm, to OR w/ Dr Lenell Antu for evacuation hematoma and repair venous branch bleed.  06/17: some bleeding around drain site overnight requiring few dressing changes, 1 unit PRBC ordered, hold heparin will monitor.  06/18: stable. Heparin restarted.  06/19: Heparin --> Eliquis. Wound vacs placed to groin incisions.   Consultants:  Neurology Vascular surgery  Cardiology   Procedures: 11/02/22 Bilateral LE angiogram w/ Dr Wyn Quaker  11/04/22 endarterectomy/stents bilateral femoral  w/ Dr Wyn Quaker 11/06/22  evacuation of left groin hematoma and direct repair of venous branch bleeding w/ Dr Lenell Antu       ASSESSMENT & PLAN:   Principal Problem:   Fall Active Problems:   Fall at home, initial encounter   Tobacco abuse   PAD (peripheral artery disease) (HCC)   Essential hypertension   Alcohol abuse   RUE weakness   Non-traumatic rhabdomyolysis   Positive D dimer   Abnormal EKG   LLE w/ severe PAD, ischemic L foot w/ neurological deficits likely cause for fall S/p angiograpy 06/12 and endarterectomy/stenting 06/14 Left leg still neurologically impaired from ischemia. Unlikely to recover  Vascular surgery following Per Dr Wyn Quaker, LLE may require amputation even after revascularization due to neurologic deficits. Appreciate recs re: clearance for discharge to follow outpatient vs anticipate AKA here.  JP drain in place full this morning, wound vacs replaced today  Ok to d/c heparin today, convert to Eliquis and ASA  SIRS - improving  (+)SIRS, w/ tachycardia, tachypnea, elevated WBC CXR --> no apparent pneumonia  UA --> UTI, see below  BCx, UCx --> UCx no growth, completed 3 days Rocephin   ABLA Postoperative hematoma - evacuated 06/16 Monitor CBC/HH closely Transfusion tpday (this makes 2 units PRBC total thus far) Holding heparin this morning  Remain in stepdown for now, will leave there for today if heparin restarted, if no bleeding may consider transfer to progressive   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 HTN HLD Previous Lexiscan w/o ischemia Per cardiology note 02/2021 "high risk, if he develops chest pain with exertion or dyspnea, will schedule left heart cath." Continuing on  heparin infusion per vascular team  We were holding statin given transaminitis but risk of holding may be greater than risk to liver, continue statin and will monitor LFT's closely  DVT L femoral and popliteal V  PE ruled out  Heparin gtt Vascular surgery following  but no intervention needed at this time for the DVT    Rhabdomyolysis w/ normal renal function  IV fluids Follow renal function and CK   History CVA w/ residual R sided deficits (+)subacute R cerebellar punctate infarct on MRI ASA We were holding statin given transaminitis but risk of holding may be greater than risk to liver, will monitor LFT's closely Neurology following  On heparin gtt for DVT and ischemic leg  Transaminitis - improving  Hepatic Steatosis on RUQ [place] EtOH use but no acute intoxication based on blood levels - question alcoholic hepatitis  Follow CMP We were holding statin given transaminitis but risk of holding may be greater than risk to liver, will monitor LFT's closely  Weakness and fall Confusion / AMS question toxic metabolic encephalopathy or CVA Likely multifactorial Pressing concern for ischemic LLE as above w/ neuro deficits predisposing to fall  Concern for CVA see MRI brain Potential EtOH withdrawal  Treat multiple underlying issues  PT/OT as able Fall precaution   EtOH use but no acute intoxication based on blood levels  CIWA protocol  Thiamine  Hypokalemia Replace as needed Monitor BMP  Essential HTN lower BP to goal - window for permissive HTN w/ CVA has lapsed  Tobacco abuse Nicotine patch     DVT Tx: has been on heparin, onto Eliquis 11/09/22  Pertinent IV fluids/nutrition: no continuous IV fluids  Central lines / invasive devices: wound vacs  Code Status: FULL CODE ACP documentation reviewed: 11/02/22 none on file   Current Admission Status: inpatient  TOC needs / Dispo plan: SNF Barriers to discharge / significant pending items: vascular clearance prior to discharge - potentially will need AKA vs ok to follow outpatient

## 2022-11-02 NOTE — ED Notes (Signed)
Patient's right foot is cool, but not abnormally so.  Pulse is weak, but palpable.  Patient's left foot is cold to the touch, and no palpable pulse is noted.  Use of doppler yielded no result when searching for pulse.  Patient noted increased pain when bearing weight on left leg. Dr. Allena Katz notified.

## 2022-11-02 NOTE — H&P (View-Only) (Signed)
Hospital Consult    Reason for Consult:  Cold Left Lower Extremity  Requesting Physician:  Dr Sunnie Nielsen MD MRN #:  [id number]  History of Present Illness: This is a 67 y.o. male Corey Olson is a 67 y.o. male with medical history significant for history of stroke with right-sided deficit at baseline, COPD, hypertension, alcohol abuse presenting to the emergency room for fall on Sunday. Initial report consisted of left-sided weakness and numbness and was dragging his left foot.  On exam in the emergency department patient is resting comfortably in a stretcher in the emergency room.  Patient endorses 8 out of 10 pain to his left lower extremity.  Patient's foot up to the distal part of his knee is cold to touch.  I was unable to palpate pulses in the left lower extremity as well as the right lower extremity.  Patient's right lower extremity remains warm to touch.  Patient states his left leg has been hurting for months but it has not been this cold.  No other complaints at this time.  Patient endorses last eating breakfast at 7:00 AM this morning.  Vitals are remained stable.  Past Medical History:  Diagnosis Date   Alcohol use disorder, mild, abuse    COPD (chronic obstructive pulmonary disease) (HCC)    Gout    Hyperlipidemia    Hypertension    Hypothyroid    Peripheral vascular complication    Stroke [facility])     Past Surgical History:  Procedure Laterality Date   LOWER EXTREMITY ANGIOGRAPHY Right 08/25/2020   Procedure: LOWER EXTREMITY ANGIOGRAPHY;  Surgeon: Renford Dills, MD;  Location: [facility] INVASIVE CV LAB;  Service: Cardiovascular;  Laterality: Right;   LOWER EXTREMITY ANGIOGRAPHY Left 06/15/2021   Procedure: LOWER EXTREMITY ANGIOGRAPHY;  Surgeon: Renford Dills, MD;  Location: [facility] INVASIVE CV LAB;  Service: Cardiovascular;  Laterality: Left;    No Known Allergies  Prior to Admission medications   Medication Sig Start Date End Date Taking? Authorizing Provider   acetaminophen (TYLENOL) 500 MG tablet Take 500 mg by mouth every 6 (six) hours as needed for moderate pain or mild pain (lEG PAIN).   Yes [provider]  albuterol (VENTOLIN HFA) 108 (90 Base) MCG/ACT inhaler Inhale 2 puffs into the lungs every 6 (six) hours as needed for wheezing or shortness of breath.   Yes [provider]  allopurinol (ZYLOPRIM) 300 MG tablet Take 300 mg by mouth daily.   Yes [provider]  aspirin 81 MG EC tablet Take 325 mg by mouth daily.   Yes [provider]  atorvastatin (LIPITOR) 80 MG tablet Take 80 mg by mouth daily.   Yes [provider]  budesonide-formoterol (SYMBICORT) 160-4.5 MCG/ACT inhaler Inhale 2 puffs into the lungs daily. 01/28/15  Yes [provider]  gabapentin (NEURONTIN) 100 MG capsule Take 100 mg by mouth 2 (two) times daily.   Yes [provider]  lisinopril (ZESTRIL) 20 MG tablet Take 20 mg by mouth daily. 06/16/20  Yes [provider]  metoprolol succinate (TOPROL-XL) 50 MG 24 hr tablet Take 50 mg by mouth daily. 08/09/22  Yes [provider]  amLODipine (NORVASC) 10 MG tablet Take 10 mg by mouth daily. Patient not taking: Reported on 11/01/2022 01/28/15   [provider]  APIXABAN Everlene Balls) VTE STARTER PACK (10MG  AND 5MG ) Take as directed on package: start with two-5mg  tablets twice daily for 7 days. On day 8, switch to one-5mg  tablet twice daily. Patient not taking:  Reported on 06/10/2021 09/11/20   Merwyn Katos, MD  clopidogrel (PLAVIX) 75 MG tablet Take 1 tablet (75 mg total) by mouth daily. Patient not taking: Reported on 06/10/2021 08/26/20   Schnier, Latina Craver, MD    Social History   Socioeconomic History   Marital status: Single    Spouse name: Not on file   Number of children: Not on file   Years of education: Not on file   Highest education level: Not on file  Occupational History   Not on file  Tobacco Use   Smoking status: Every Day     Packs/day: 1.00    Years: 50.00    Additional pack years: 0.00    Total pack years: 50.00    Types: Cigarettes   Smokeless tobacco: Never  Substance and Sexual Activity   Alcohol use: Yes    Comment: social   Drug use: Never   Sexual activity: Not on file  Other Topics Concern   Not on file  Social History Narrative   Not on file   Social Determinants of Health   Financial Resource Strain: Not on file  Food Insecurity: No Food Insecurity (11/02/2022)   Hunger Vital Sign    Worried About Running Out of Food in the Last Year: Never true    Ran Out of Food in the Last Year: Never true  Transportation Needs: No Transportation Needs (11/02/2022)   PRAPARE - Administrator, Civil Service (Medical): No    Lack of Transportation (Non-Medical): No  Physical Activity: Not on file  Stress: Not on file  Social Connections: Not on file  Intimate Partner Violence: Not At Risk (11/02/2022)   Humiliation, Afraid, Rape, and Kick questionnaire    Fear of Current or Ex-Partner: No    Emotionally Abused: No    Physically Abused: No    Sexually Abused: No     Family History  Problem Relation Age of Onset   Heart attack Mother    Heart Problems Father    Heart attack Father     ROS: Otherwise negative unless mentioned in HPI  Physical Examination  Vitals:   11/02/22 1030 11/02/22 1100  BP: (!) 167/53 (!) 148/64  Pulse: 78 69  Resp: (!) 31 (!) 29  Temp:    SpO2: 100% 100%   Body mass index is 25.17 kg/m.  General:  WDWN in NAD Gait: Not observed HENT: WNL, normocephalic Pulmonary: normal non-labored breathing, without Rales, rhonchi,  wheezing Cardiac: regular, without  Murmurs, rubs or gallops; without carotid bruits Abdomen: Positive bowel sounds, soft, NT/ND, no masses Skin: without rashes Vascular Exam/Pulses: No pulses in left lower extremity, +1 pulses via doppler on the right lower extremity Extremities: with ischemic changes, without Gangrene , without  cellulitis; without open wounds;  Musculoskeletal: no muscle wasting or atrophy  Neurologic: A&O X 3;  No focal weakness or paresthesias are detected; speech is fluent/normal Psychiatric:  The pt has Normal affect. Lymph:  Unremarkable  CBC    Component Value Date/Time   WBC 9.7 11/02/2022 0625   RBC 4.64 11/02/2022 0625   HGB 13.5 11/02/2022 0625   HCT 41.8 11/02/2022 0625   PLT 159 11/02/2022 0625   MCV 90.1 11/02/2022 0625   MCH 29.1 11/02/2022 0625   MCHC 32.3 11/02/2022 0625   RDW 14.6 11/02/2022 0625   LYMPHSABS 1.1 11/01/2022 1843   MONOABS 0.6 11/01/2022 1843   EOSABS 0.0 11/01/2022 1843   BASOSABS 0.0 11/01/2022 1843  BMET    Component Value Date/Time   NA 139 11/02/2022 0625   K 3.4 (L) 11/02/2022 0625   CL 104 11/02/2022 0625   CO2 21 (L) 11/02/2022 0625   GLUCOSE 84 11/02/2022 0625   BUN 12 11/02/2022 0625   CREATININE 0.98 11/02/2022 0625   CALCIUM 7.8 (L) 11/02/2022 0625   GFRNONAA >60 11/02/2022 0625    COAGS: Lab Results  Component Value Date   INR 1.0 11/01/2022     Non-Invasive Vascular Imaging:   EXAM: CT ANGIOGRAPHY OF ABDOMINAL AORTA WITH ILIOFEMORAL RUNOFF   TECHNIQUE: Multidetector CT imaging of the abdomen, pelvis and lower extremities was performed using the standard protocol during bolus administration of intravenous contrast. Multiplanar CT image reconstructions and MIPs were obtained to evaluate the vascular anatomy.   RADIATION DOSE REDUCTION: This exam was performed according to the departmental dose-optimization program which includes automated exposure control, adjustment of the mA and/or kV according to patient size and/or use of iterative reconstruction technique.   CONTRAST:  OMNIPAQUE IOHEXOL 350 MG/ML SOLN   COMPARISON:  None Available.   FINDINGS: VASCULAR   Aorta: Extensive atherosclerosis.  No aneurysm.   Celiac: Extensive atherosclerosis. Widely patent. Vessel is diffusely ectatic.   SMA: Arises  from the celiac artery with extensive calcifications. Mild narrowing within the proximal to mid SMA.   Renals: Single renal arteries bilaterally, heavily calcified, suspect moderate left renal artery stenosis.   IMA: Patent   RIGHT Lower Extremity   Inflow: Heavily calcified common, internal and external right iliac artery. Stent seen within the right common carotid artery extending to near the common femoral artery. 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 Common and external iliac arteries patent.   Outflow: Heavily calcified disease common femoral artery. There is occlusion of the proximal right SFA. Deep femoral artery heavily disease proximally with likely severe proximal stenosis. Contrast is seen within the vessel presumably related to collaterals.   Runoff: Reconstitution of a severely diseased popliteal artery. Severely diseased trifurcation vessels. Dominant runoff appears to be via diseased anterior tibial artery.   LEFT Lower Extremity   Inflow: Left common iliac, external iliac arteries are occluded. Stent seen within the left external iliac artery. There is brief reconstitution of the left common femoral artery which is severely diseased and stenotic.   Outflow: Occlusion of the left superficial femoral artery and deep femoral artery.   Runoff: No visible significant reconstitution in the left calf.   Veins: No obvious venous abnormality within the limitations of this arterial phase study.   Review of the MIP images confirms the above findings.   NON-VASCULAR   Lower chest: No acute abnormality.   Hepatobiliary: Innumerable subcentimeter low-density lesions throughout the liver, likely small cysts. Gallbladder unremarkable.   Pancreas: No focal abnormality or ductal dilatation.   Spleen: No focal abnormality.  Normal size.   Adrenals/Urinary Tract: Adrenal glands normal. Bilateral renal cysts, the largest in the upper pole on the right measuring 2.7  cm. No follow-up imaging recommended. No hydronephrosis. Urinary bladder grossly unremarkable.   Stomach/Bowel: Left colonic diverticulosis. No active diverticulitis. Normal appendix. Stomach and small bowel decompressed, unremarkable.   Lymphatic: No adenopathy   Reproductive: Prostate enlargement.   Other: No free fluid or free air.   Musculoskeletal: No acute bony abnormality.   IMPRESSION: VASCULAR   Heavily calcified and severely diseased aorta and mesenteric vessels.   Severely diseased iliofemoral vessels on the right. Prior standing in the right common and external iliac arteries.  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 Dominant runoff in the right lower extremity via a diseased anterior tibial artery.   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   Reconstitution of a severely diseased common femoral artery with some flow in a severely diseased deep femoral artery. Left SFA and popliteal arteries occluded. No significant reconstitution in the left calf.   NON-VASCULAR   Left colonic diverticulosis. No active diverticulitis.   No acute findings in the abdomen or pelvis.    Statin:  Yes.   Beta Blocker:  Yes.   Aspirin:  No. ACEI:  Yes.   ARB:  No. CCB use:  No Other antiplatelets/anticoagulants:  Yes.   Plavix 75 mg daily   ASSESSMENT/PLAN: This is a 67 y.o. male who presents to [facility] [place] emergency department with left lower extremity pain 8 out of 10 today.  Upon presentation it was noted patient's left lower extremity is very cold to touch from his toes to the distal part of his left knee.  Unable to get a Doppler pulses in the left lower extremity today.  Right lower extremity Doppler pulses were +1 and very faint.  It was explained to the patient after pulses were unable to be obtained that today's procedure has a very low likelihood of restoring any blood flow  and saving patient's leg.  More than likely he may need open surgery in the very near future or possible left lower extremity amputation.  Patient again verbalizes his understanding.  PLAN: Vascular surgery plans on taking the patient to the vascular lab this afternoon on 11/02/2022 for left lower extremity angiogram with possible intervention.  I discussed in detail with the patient the procedure, benefits, risks, and complications.  Patient verbalizes understanding.  I answered all the patient's questions.  Patient would like to proceed as soon as possible.  Patient last states he ate breakfast this morning at around 7 AM.  Patient was made n.p.o. and vitals all remained stable.   -I discussed the plan in detail with Dr. [name] MD and he is in agreement with the plan.   Marcie Bal [facility] 11/02/2022 1:09 PM

## 2022-11-02 NOTE — ED Notes (Signed)
Pt at MRI. 2 RN verification to transfer Heparin drip onto MRI pump.

## 2022-11-02 NOTE — Progress Notes (Signed)
Transition of Care [facility]) - Inpatient Brief Assessment   Patient Details  Name: Corey Olson MRN: [id number] Date of Birth: 09-21-1955  [facility]) CM/SW Contact:    Darolyn Rua, LCSW Phone Number: 11/02/2022, 9:21 AM   Clinical Narrative:  PCP:  Dr. Hyacinth Meeker Insurance : Medicare  Patient is from home with family independent at baseline, presented to ED with AMS, L side weakness and numbness, stroke hx. Potential L leg dvt, vascular consulted.   No TOC needs at this time, please consult should needs arise.    Transition of Care Asessment: Insurance and Status: Insurance coverage has been reviewed Patient has primary care physician: Yes Home environment has been reviewed: from home with family Prior level of function:: independent Prior/Current Home Services: No current home services Social Determinants of Health Reivew: SDOH reviewed no interventions necessary Readmission risk has been reviewed: Yes Transition of care needs: no transition of care needs at this time

## 2022-11-02 NOTE — Progress Notes (Addendum)
PROGRESS NOTE    Corey Olson   ZOX:[id number] DOB: [birth year]  DOA: 11/01/2022 Date of Service: 11/02/22 PCP: [name], MD     Brief Narrative / Hospital Course:  Corey Olson is a 67 y.o. male with a history of stroke, peripheral atrial disease who presents with altered mental status, left leg weakness.  Family spoke to him at 430 and felt that he was confused.  Prior to that he had not been spoken to since the night before when he had seemed normal.  Patient reports left leg weakness dragging his left foot while walking and decreased sensation, all of which were new but without clear timelines. 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  06/11: in ED, VSS, code stroke called Per neurology - recs MRI brain, no TPN. Admitted to hospitalist service. Elevated AST/ALT c/w EtOH but EtOH levels <10 and RUQ [place] (+)hepatic steatosis, , Lactic acid ok at 1.3, CBC no concerns, elevated D-Dimer, CK >14,000 and UA no UTI but (+)Hgb no significant RBC c/w rhabdo, EtOH <10, UDS neg. [place] bilateral LE (+)DVT L femoral and popliteal V.  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 Found to have weakneed pulses LLE and CTA runoff performed showing ischemia/severe PAD, on exam LLE is cold below mid-tibia so vascular consulted, continue heparin gtt, taken to cath lab for RLE and LLE angiogram. Significant vascular disease, limb likely not salvageable per Dr Wyn Quaker - prognosis poor for even AKA healing, will likely need femoral endarterectomies of the common femoral arteries and profunda femoris arteries with external iliac stent placement on the left and kissing stent placements up into the aorta to get him enough blood flow to heal a left above-knee amputation. Can plan for surgery later this week, will need cardiac clearance    Consultants:  Neurology Vascular surgery    Procedures: Bilateral LE angiogram 11/02/22 w/ Dr Wyn Quaker       ASSESSMENT & PLAN:   Principal Problem:   Fall Active Problems:   Fall at home, initial encounter   Tobacco abuse   PAD (peripheral artery disease) (HCC)   Essential hypertension   Alcohol abuse   RUE weakness   Non-traumatic rhabdomyolysis   Positive D dimer   Abnormal EKG   LLE w/ severe PAD, ischemic L foot w/ neurological deficits likely cause for fall S/p angiography  Vascular surgery following, planning surgical intervention w/ endarterectomy/stenting later this week to optimize for AKA after that Reviewed cardiology records - plan consult to cardiology [facility]) tomorrow to assess perioperative risk, consider ischemic evaluation   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 HTN HLD Previous Lexiscan w/o ischemia Per cardiology note 02/2021 "high risk, if he develops chest pain with exertion or dyspnea, will schedule left heart cath." consult to cardiology [facility]) tomorrow to assess perioperative risk, consider ischemic evaluation   Weakness and fall Confusion / AMS question toxic metabolic encephalopathy or CVA Likely multifactorial Pressing concern for ischemic LLE as above Concern for CVA w/ MRI brain pending Potential EtOH withdrawal  Treat multiple underlying issues  PT/OT to eval when more stable  Fall precaution   DVT L femoral and popliteal V  Heparin gtt Vascular surgery consulted   Rhabdomyolysis w/ normal renal function  IV fluids and bicarb  Follow renal function and CK   History CVA w/ residual R sided deficits Concern  for acute CVA w/ new L weakness and AMS (+)subacute R cerebellar punctate infarct  ASA Hold statin for now w/ transaminitis  Neurology following  On heparin gtt for DVT and ischemic leg, confirm if plan for surgical intervention expect will start plavix   Transaminitis  Hepatic Steatosis on RUQ [place] EtOH use but no acute intoxication based on blood levels -  question alcoholic hepatitis  Follow CMP Hepatitis panel pending   EtOH use but no acute intoxication based on blood levels  CIWA protocol given poor historian  Thiamine  Hypokalemia Replace as needed Monitor BMP  Essential HTN Permissive HTN in light of potential CVA  More aggressive BP control if normal MRI brain   Tobacco abuse Nicotine patch     DVT: treating w/ heparin  Pertinent IV fluids/nutrition: NS 100 mL/h, npo pending vascular eval  Central lines / invasive devices: none  Code Status: FULL CODE ACP documentation reviewed: 11/02/22 none on file   Current Admission Status: inpatient   TOC needs / Dispo plan: TBD expect will need HH possible SNF Barriers to discharge / significant pending items: vascular eval for ischemic LLE              Subjective / Brief ROS:  Patient reports no concerns He cannot tell me details about what heppend to bringhim here just wan't feeling well and falling, LLE pain, sister called EMS Denies CP/SOB.  Pain controlled.  Denies new weakness.  Tolerating diet.  Reports no concerns w/ urination/defecation.   Family Communication: none at this time, pt decliend call to family     Objective Findings:  Vitals:   11/02/22 1618 11/02/22 1630 11/02/22 1645 11/02/22 1730  BP: (!) 152/50 (!) 161/48 (!) 156/65 (!) 140/56  Pulse: 64 74 66 71  Resp: (!) 22 (!) 23 (!) 27 20  Temp:    98.3 F (36.8 C)  TempSrc:      SpO2: 96% 96% 95% 98%  Weight:      Height:        Intake/Output Summary (Last 24 hours) at 11/02/2022 1732 Last data filed at 11/02/2022 1455 Gross per 24 hour  Intake 1047.58 ml  Output 950 ml  Net 97.58 ml   Filed Weights   11/01/22 1841  Weight: 72.9 kg    Examination:  Physical Exam Constitutional:      General: He is not in acute distress. Cardiovascular:     Rate and Rhythm: Normal rate and regular rhythm.  Pulmonary:     Effort: Pulmonary effort is normal. No respiratory distress.      Breath sounds: Rhonchi present.  Abdominal:     General: There is no distension.     Palpations: Abdomen is soft.  Skin:    Comments: Cool to touch LLE distal to mid-tibia   Neurological:     Mental Status: He is alert. He is disoriented.     Motor: Weakness present.  Psychiatric:        Behavior: Behavior normal.          Scheduled Medications:   nicotine  21 mg Transdermal Daily   pantoprazole  40 mg Oral BID   sodium chloride flush  3 mL Intravenous Q12H   [START ON 11/03/2022] thiamine  100 mg Oral Daily    Continuous Infusions:  sodium chloride 100 mL/hr at 11/02/22 1355   heparin 950 Units/hr (11/02/22 1652)    PRN Medications:  acetaminophen **OR** acetaminophen  Antimicrobials from admission:  Anti-infectives (From admission, onward)  Start     Dose/Rate Route Frequency Ordered Stop   11/02/22 1431  ceFAZolin (ANCEF) IVPB 2g/100 mL premix        2 g 200 mL/hr over 30 Minutes Intravenous 30 min pre-op 11/02/22 1431 11/02/22 1653           Data Reviewed:  I have personally reviewed the following...  CBC: Recent Labs  Lab 11/01/22 1843 11/02/22 0625  WBC 9.3 9.7  NEUTROABS 7.5  --   HGB 14.5 13.5  HCT 44.9 41.8  MCV 90.3 90.1  PLT 156 159   Basic Metabolic Panel: Recent Labs  Lab 11/01/22 1843 11/01/22 2001 11/02/22 0625  NA 136  --  139  K 3.5  --  3.4*  CL 102  --  104  CO2 19*  --  21*  GLUCOSE 88  --  84  BUN 14  --  12  CREATININE 1.08  --  0.98  CALCIUM 8.5*  --  7.8*  MG  --  2.0  --    GFR: Estimated Creatinine Clearance: 68.4 mL/min (by C-G formula based on SCr of 0.98 mg/dL). Liver Function Tests: Recent Labs  Lab 11/01/22 1843 11/02/22 0625  AST 296* 366*  ALT 63* 74*  ALKPHOS 55 57  BILITOT 1.3* 1.6*  PROT 7.0 6.6  ALBUMIN 4.2 3.7   Recent Labs  Lab 11/01/22 2001  LIPASE 27   No results for input(s): "AMMONIA" in the last 168 hours. Coagulation Profile: Recent Labs  Lab 11/01/22 1843  INR 1.0    Cardiac Enzymes: Recent Labs  Lab 11/01/22 2001  CKTOTAL 14,578*   BNP (last 3 results) No results for input(s): "PROBNP" in the last 8760 hours. HbA1C: No results for input(s): "HGBA1C" in the last 72 hours. CBG: Recent Labs  Lab 11/01/22 1842 11/02/22 0215  GLUCAP 91 84   Lipid Profile: No results for input(s): "CHOL", "HDL", "LDLCALC", "TRIG", "CHOLHDL", "LDLDIRECT" in the last 72 hours. Thyroid Function Tests: No results for input(s): "TSH", "T4TOTAL", "FREET4", "T3FREE", "THYROIDAB" in the last 72 hours. Anemia Panel: No results for input(s): "VITAMINB12", "FOLATE", "FERRITIN", "TIBC", "IRON", "RETICCTPCT" in the last 72 hours. Most Recent Urinalysis On File:     Component Value Date/Time   COLORURINE YELLOW (A) 11/01/2022 2011   APPEARANCEUR CLEAR (A) 11/01/2022 2011   LABSPEC 1.023 11/01/2022 2011   PHURINE 5.0 11/01/2022 2011   GLUCOSEU NEGATIVE 11/01/2022 2011   HGBUR LARGE (A) 11/01/2022 2011   BILIRUBINUR NEGATIVE 11/01/2022 2011   KETONESUR 80 (A) 11/01/2022 2011   PROTEINUR 100 (A) 11/01/2022 2011   NITRITE NEGATIVE 11/01/2022 2011   LEUKOCYTESUR NEGATIVE 11/01/2022 2011   Sepsis Labs: @LABRCNTIP (procalcitonin:4,lacticidven:4) Microbiology: No results found for this or any previous visit (from the past 240 hour(s)).    Radiology Studies last 3 days: PERIPHERAL VASCULAR CATHETERIZATION  Result Date: 11/02/2022 See surgical note for result.  MR BRAIN WO CONTRAST  Result Date: 11/02/2022 CLINICAL DATA:  Stroke, follow-up. EXAM: MRI HEAD WITHOUT CONTRAST TECHNIQUE: Multiplanar, multiecho pulse sequences of the brain and surrounding structures were obtained without intravenous contrast. COMPARISON:  Head CT 11/01/2022. FINDINGS: Brain: Punctate subacute infarct in the right cerebellar hemisphere (image 7 series 5). No acute hemorrhage or significant mass effect. Encephalomalacia in the high left frontal lobe from prior ACA-MCA border zone infarct. No  hydrocephalus or extra-axial collection. No mass or midline shift. 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 Skull  and upper cervical spine: Normal marrow signal. Sinuses/Orbits: Unremarkable. Other: None. IMPRESSION: 1. Punctate subacute infarct in the right cerebellar hemisphere. No acute hemorrhage or significant mass effect. 2. 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 Electronically Signed   By: Orvan Falconer M.D.   On: 11/02/2022 13:28   CT ANGIO AO+BIFEM W & OR WO CONTRAST  Result Date: 11/02/2022 CLINICAL DATA:  Cold right foot, weak pulse. Left foot cold, no palpable pulse. EXAM: CT ANGIOGRAPHY OF ABDOMINAL AORTA WITH ILIOFEMORAL RUNOFF TECHNIQUE: Multidetector CT imaging of the abdomen, pelvis and lower extremities was performed using the standard protocol during bolus administration of intravenous contrast. Multiplanar CT image reconstructions and MIPs were obtained to evaluate the vascular anatomy. RADIATION DOSE REDUCTION: This exam was performed according to the departmental dose-optimization program which includes automated exposure control, adjustment of the mA and/or kV according to patient size and/or use of iterative reconstruction technique. CONTRAST:  OMNIPAQUE IOHEXOL 350 MG/ML SOLN COMPARISON:  None Available. FINDINGS: VASCULAR Aorta: Extensive atherosclerosis.  No aneurysm. Celiac: Extensive atherosclerosis. Widely patent. Vessel is diffusely ectatic. SMA: Arises from the celiac artery with extensive calcifications. Mild narrowing within the proximal to mid SMA. Renals: Single renal arteries bilaterally, heavily calcified, suspect moderate left renal artery stenosis. IMA: Patent RIGHT Lower Extremity Inflow: Heavily calcified common, internal and external right iliac artery. Stent seen within the right common carotid artery extending to near the  common femoral artery. 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 Common and external iliac arteries patent. Outflow: Heavily calcified disease common femoral artery. There is occlusion of the proximal right SFA. Deep femoral artery heavily disease proximally with likely severe proximal stenosis. Contrast is seen within the vessel presumably related to collaterals. Runoff: Reconstitution of a severely diseased popliteal artery. Severely diseased trifurcation vessels. Dominant runoff appears to be via diseased anterior tibial artery. LEFT Lower Extremity Inflow: Left common iliac, external iliac arteries are occluded. Stent seen within the left external iliac artery. There is brief reconstitution of the left common femoral artery which is severely diseased and stenotic. Outflow: Occlusion of the left superficial femoral artery and deep femoral artery. Runoff: No visible significant reconstitution in the left calf. Veins: No obvious venous abnormality within the limitations of this arterial phase study. Review of the MIP images confirms the above findings. NON-VASCULAR Lower chest: No acute abnormality. Hepatobiliary: Innumerable subcentimeter low-density lesions throughout the liver, likely small cysts. Gallbladder unremarkable. Pancreas: No focal abnormality or ductal dilatation. Spleen: No focal abnormality.  Normal size. Adrenals/Urinary Tract: Adrenal glands normal. Bilateral renal cysts, the largest in the upper pole on the right measuring 2.7 cm. No follow-up imaging recommended. No hydronephrosis. Urinary bladder grossly unremarkable. Stomach/Bowel: Left colonic diverticulosis. No active diverticulitis. Normal appendix. Stomach and small bowel decompressed, unremarkable. Lymphatic: No adenopathy Reproductive: Prostate enlargement. Other: No free fluid or free air. Musculoskeletal: No acute bony abnormality. 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 Severely  diseased iliofemoral vessels on the right. Prior standing in the right common and external iliac arteries. 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 Dominant runoff in the right lower extremity via a diseased anterior tibial artery. 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 Reconstitution of a severely diseased common femoral artery with some flow in a severely diseased deep femoral artery. Left SFA and popliteal arteries occluded. No significant reconstitution in the  left calf. NON-VASCULAR Left colonic diverticulosis. No active diverticulitis. No acute findings in the abdomen or pelvis. Electronically Signed   By: Charlett Nose M.D.   On: 11/02/2022 02:38   US Venous Img Lower Bilateral (DVT)  Result Date: 11/02/2022 CLINICAL DATA:  Bilateral leg pain. EXAM: BILATERAL LOWER EXTREMITY VENOUS DOPPLER ULTRASOUND TECHNIQUE: Gray-scale sonography with graded compression, as well as color Doppler and duplex ultrasound were performed to evaluate the lower extremity deep venous systems from the level of the common femoral vein and including the common femoral, femoral, profunda femoral, popliteal and calf veins including the posterior tibial, peroneal and gastrocnemius veins when visible. The superficial great saphenous vein was also interrogated. Spectral Doppler was utilized to evaluate flow at rest and with distal augmentation maneuvers in the common femoral, femoral and popliteal veins. COMPARISON:  None Available. FINDINGS: RIGHT LOWER EXTREMITY Common Femoral Vein: No evidence of thrombus. Normal compressibility, respiratory phasicity and response to augmentation. Saphenofemoral Junction: No evidence of thrombus. Normal compressibility and flow on color Doppler imaging. Profunda Femoral Vein: No evidence of thrombus. Normal compressibility and flow on color Doppler imaging. Femoral Vein: No evidence of thrombus. Normal  compressibility, respiratory phasicity and response to augmentation. Popliteal Vein: No evidence of thrombus. Normal compressibility, respiratory phasicity and response to augmentation. Calf Veins: No evidence of thrombus. Normal compressibility and flow on color Doppler imaging. Superficial Great Saphenous Vein: No evidence of thrombus. Normal compressibility. Venous Reflux:  None. Other Findings:  None. LEFT LOWER EXTREMITY Common Femoral Vein: No evidence of thrombus. Normal compressibility, respiratory phasicity and response to augmentation. Saphenofemoral Junction: No evidence of thrombus. Normal compressibility and flow on color Doppler imaging. Profunda Femoral Vein: No evidence of thrombus. Normal compressibility and flow on color Doppler imaging. Femoral Vein: Evidence of nonocclusive thrombus with abnormal compressibility, respiratory phasicity and response to augmentation. Popliteal Vein: Evidence of nonocclusive thrombus with abnormal compressibility, respiratory phasicity and response to augmentation. Calf Veins: No evidence of thrombus. Normal compressibility and flow on color Doppler imaging. Superficial Great Saphenous Vein: No evidence of thrombus. Normal compressibility. Venous Reflux:  None. Other Findings:  None. IMPRESSION: 1. 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 2. No evidence of DVT within the RIGHT lower extremity. Electronically Signed   By: Aram Candela M.D.   On: 11/02/2022 00:55   US Abdomen Limited RUQ (LIVER/GB)  Result Date: 11/02/2022 CLINICAL DATA:  Abnormal liver function test. EXAM: ULTRASOUND ABDOMEN LIMITED RIGHT UPPER QUADRANT COMPARISON:  None Available. FINDINGS: Gallbladder: No gallstones or wall thickening visualized (3.2 mm). No sonographic Murphy sign noted by sonographer. Common bile duct: Diameter: 3.8 mm Liver: Multiple small hepatic cysts are seen. The largest measures approximately 1.0 cm x 0.6 cm x 1.3 cm  and is located within the left lobe of the liver. Diffusely increased echogenicity of the liver parenchyma is noted. Portal vein is patent on color Doppler imaging with normal direction of blood flow towards the liver. Other: Multiple simple cysts are seen within the right kidney. The largest measures approximately 3.4 cm x 2.8 cm x 2.8 cm. A small right pleural effusion is noted. IMPRESSION: 1. Hepatic steatosis with multiple hepatic and right renal cysts. 2. Small right pleural effusion. Electronically Signed   By: Aram Candela M.D.   On: 11/02/2022 00:52   CT HEAD CODE STROKE WO CONTRAST  Result Date: 11/01/2022 CLINICAL DATA:  Code stroke. Altered mental status, confusion, left-sided weakness and numbness EXAM: CT HEAD WITHOUT CONTRAST TECHNIQUE: Contiguous  axial images were obtained from the base of the skull through the vertex without intravenous contrast. RADIATION DOSE REDUCTION: This exam was performed according to the departmental dose-optimization program which includes automated exposure control, adjustment of the mA and/or kV according to patient size and/or use of iterative reconstruction technique. COMPARISON:  11/22/2008 FINDINGS: Brain: No evidence of acute infarction, hemorrhage, mass, mass effect, or midline shift. No hydrocephalus or extra-axial collection. 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 Vascular: No hyperdense vessel. Atherosclerotic calcifications in the intracranial carotid and vertebral arteries. Skull: Negative for fracture or focal lesion. Sinuses/Orbits: No acute finding. Other: The mastoid air cells are well aerated. ASPECTS [facility] Stroke Program Early CT Score) - Ganglionic level infarction (caudate, lentiform nuclei, internal capsule, insula, M1-M3 cortex): 7 - Supraganglionic infarction (M4-M6 cortex): 3 Total score (0-10 with 10 being normal): 10 IMPRESSION: 1. No  acute intracranial process. 2. ASPECTS is 10 Code stroke imaging results were communicated on 11/01/2022 at 6:58 pm to provider [facility] via telephone, who verbally acknowledged these results. Electronically Signed   By: Wiliam Ke M.D.   On: 11/01/2022 18:59             LOS: 0 days    Time spent: 50 min    Sunnie Nielsen, DO [facility] 11/02/2022, 5:32 PM    Dictation software may have been used to generate the above note. Typos may occur and escape review in typed/dictated notes. Please contact Dr Lyn Hollingshead directly for clarity if needed.  Staff may message me via secure chat in Epic  but this may not receive an immediate response,  please page me for urgent matters!  If 7PM-7AM, please contact night coverage [email or web address]

## 2022-11-02 NOTE — Consult Note (Signed)
Hospital Consult    Reason for Consult:  Cold Left Lower Extremity  Requesting Physician:  Dr Natalie Alexander MD MRN #:  [id number]  History of Present Illness: This is a 67 y.o. male Corey Olson is a 67 y.o. male with medical history significant for history of stroke with right-sided deficit at baseline, COPD, hypertension, alcohol abuse presenting to the emergency room for fall on Sunday. Initial report consisted of left-sided weakness and numbness and was dragging his left foot.  On exam in the emergency department patient is resting comfortably in a stretcher in the emergency room.  Patient endorses 8 out of 10 pain to his left lower extremity.  Patient's foot up to the distal part of his knee is cold to touch.  I was unable to palpate pulses in the left lower extremity as well as the right lower extremity.  Patient's right lower extremity remains warm to touch.  Patient states his left leg has been hurting for months but it has not been this cold.  No other complaints at this time.  Patient endorses last eating breakfast at 7:00 AM this morning.  Vitals are remained stable.  Past Medical History:  Diagnosis Date   Alcohol use disorder, mild, abuse    COPD (chronic obstructive pulmonary disease) (HCC)    Gout    Hyperlipidemia    Hypertension    Hypothyroid    Peripheral vascular complication    Stroke (HCC)     Past Surgical History:  Procedure Laterality Date   LOWER EXTREMITY ANGIOGRAPHY Right 08/25/2020   Procedure: LOWER EXTREMITY ANGIOGRAPHY;  Surgeon: Schnier, Gregory G, MD;  Location: [facility] INVASIVE CV LAB;  Service: Cardiovascular;  Laterality: Right;   LOWER EXTREMITY ANGIOGRAPHY Left 06/15/2021   Procedure: LOWER EXTREMITY ANGIOGRAPHY;  Surgeon: Schnier, Gregory G, MD;  Location: [facility] INVASIVE CV LAB;  Service: Cardiovascular;  Laterality: Left;    No Known Allergies  Prior to Admission medications   Medication Sig Start Date End Date Taking? Authorizing Provider   acetaminophen (TYLENOL) 500 MG tablet Take 500 mg by mouth every 6 (six) hours as needed for moderate pain or mild pain (lEG PAIN).   Yes [provider]  albuterol (VENTOLIN HFA) 108 (90 Base) MCG/ACT inhaler Inhale 2 puffs into the lungs every 6 (six) hours as needed for wheezing or shortness of breath.   Yes [provider]  allopurinol (ZYLOPRIM) 300 MG tablet Take 300 mg by mouth daily.   Yes [provider]  aspirin 81 MG EC tablet Take 325 mg by mouth daily.   Yes [provider]  atorvastatin (LIPITOR) 80 MG tablet Take 80 mg by mouth daily.   Yes [provider]  budesonide-formoterol (SYMBICORT) 160-4.5 MCG/ACT inhaler Inhale 2 puffs into the lungs daily. 01/28/15  Yes [provider]  gabapentin (NEURONTIN) 100 MG capsule Take 100 mg by mouth 2 (two) times daily.   Yes [provider]  lisinopril (ZESTRIL) 20 MG tablet Take 20 mg by mouth daily. 06/16/20  Yes [provider]  metoprolol succinate (TOPROL-XL) 50 MG 24 hr tablet Take 50 mg by mouth daily. 08/09/22  Yes [provider]  amLODipine (NORVASC) 10 MG tablet Take 10 mg by mouth daily. Patient not taking: Reported on 11/01/2022 01/28/15   [provider]  APIXABAN (ELIQUIS) VTE STARTER PACK (10MG AND 5MG) Take as directed on package: start with two-5mg tablets twice daily for 7 days. On day 8, switch to one-5mg tablet twice daily. Patient not taking:   Reported on 06/10/2021 09/11/20   Bradler, Evan K, MD  clopidogrel (PLAVIX) 75 MG tablet Take 1 tablet (75 mg total) by mouth daily. Patient not taking: Reported on 06/10/2021 08/26/20   Schnier, Gregory G, MD    Social History   Socioeconomic History   Marital status: Single    Spouse name: Not on file   Number of children: Not on file   Years of education: Not on file   Highest education level: Not on file  Occupational History   Not on file  Tobacco Use   Smoking status: Every Day     Packs/day: 1.00    Years: 50.00    Additional pack years: 0.00    Total pack years: 50.00    Types: Cigarettes   Smokeless tobacco: Never  Substance and Sexual Activity   Alcohol use: Yes    Comment: social   Drug use: Never   Sexual activity: Not on file  Other Topics Concern   Not on file  Social History Narrative   Not on file   Social Determinants of Health   Financial Resource Strain: Not on file  Food Insecurity: No Food Insecurity (11/02/2022)   Hunger Vital Sign    Worried About Running Out of Food in the Last Year: Never true    Ran Out of Food in the Last Year: Never true  Transportation Needs: No Transportation Needs (11/02/2022)   PRAPARE - Transportation    Lack of Transportation (Medical): No    Lack of Transportation (Non-Medical): No  Physical Activity: Not on file  Stress: Not on file  Social Connections: Not on file  Intimate Partner Violence: Not At Risk (11/02/2022)   Humiliation, Afraid, Rape, and Kick questionnaire    Fear of Current or Ex-Partner: No    Emotionally Abused: No    Physically Abused: No    Sexually Abused: No     Family History  Problem Relation Age of Onset   Heart attack Mother    Heart Problems Father    Heart attack Father     ROS: Otherwise negative unless mentioned in HPI  Physical Examination  Vitals:   11/02/22 1030 11/02/22 1100  BP: (!) 167/53 (!) 148/64  Pulse: 78 69  Resp: (!) 31 (!) 29  Temp:    SpO2: 100% 100%   Body mass index is 25.17 kg/m.  General:  WDWN in NAD Gait: Not observed HENT: WNL, normocephalic Pulmonary: normal non-labored breathing, without Rales, rhonchi,  wheezing Cardiac: regular, without  Murmurs, rubs or gallops; without carotid bruits Abdomen: Positive bowel sounds, soft, NT/ND, no masses Skin: without rashes Vascular Exam/Pulses: No pulses in left lower extremity, +1 pulses via doppler on the right lower extremity Extremities: with ischemic changes, without Gangrene , without  cellulitis; without open wounds;  Musculoskeletal: no muscle wasting or atrophy  Neurologic: A&O X 3;  No focal weakness or paresthesias are detected; speech is fluent/normal Psychiatric:  The pt has Normal affect. Lymph:  Unremarkable  CBC    Component Value Date/Time   WBC 9.7 11/02/2022 0625   RBC 4.64 11/02/2022 0625   HGB 13.5 11/02/2022 0625   HCT 41.8 11/02/2022 0625   PLT 159 11/02/2022 0625   MCV 90.1 11/02/2022 0625   MCH 29.1 11/02/2022 0625   MCHC 32.3 11/02/2022 0625   RDW 14.6 11/02/2022 0625   LYMPHSABS 1.1 11/01/2022 1843   MONOABS 0.6 11/01/2022 1843   EOSABS 0.0 11/01/2022 1843   BASOSABS 0.0 11/01/2022 1843      BMET    Component Value Date/Time   NA 139 11/02/2022 0625   K 3.4 (L) 11/02/2022 0625   CL 104 11/02/2022 0625   CO2 21 (L) 11/02/2022 0625   GLUCOSE 84 11/02/2022 0625   BUN 12 11/02/2022 0625   CREATININE 0.98 11/02/2022 0625   CALCIUM 7.8 (L) 11/02/2022 0625   GFRNONAA >60 11/02/2022 0625    COAGS: Lab Results  Component Value Date   INR 1.0 11/01/2022     Non-Invasive Vascular Imaging:   EXAM: CT ANGIOGRAPHY OF ABDOMINAL AORTA WITH ILIOFEMORAL RUNOFF   TECHNIQUE: Multidetector CT imaging of the abdomen, pelvis and lower extremities was performed using the standard protocol during bolus administration of intravenous contrast. Multiplanar CT image reconstructions and MIPs were obtained to evaluate the vascular anatomy.   RADIATION DOSE REDUCTION: This exam was performed according to the departmental dose-optimization program which includes automated exposure control, adjustment of the mA and/or kV according to patient size and/or use of iterative reconstruction technique.   CONTRAST:  100mL OMNIPAQUE IOHEXOL 350 MG/ML SOLN   COMPARISON:  None Available.   FINDINGS: VASCULAR   Aorta: Extensive atherosclerosis.  No aneurysm.   Celiac: Extensive atherosclerosis. Widely patent. Vessel is diffusely ectatic.   SMA: Arises  from the celiac artery with extensive calcifications. Mild narrowing within the proximal to mid SMA.   Renals: Single renal arteries bilaterally, heavily calcified, suspect moderate left renal artery stenosis.   IMA: Patent   RIGHT Lower Extremity   Inflow: Heavily calcified common, internal and external right iliac artery. Stent seen within the right common carotid artery extending to near the common femoral artery. 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 Common and external iliac arteries patent.   Outflow: Heavily calcified disease common femoral artery. There is occlusion of the proximal right SFA. Deep femoral artery heavily disease proximally with likely severe proximal stenosis. Contrast is seen within the vessel presumably related to collaterals.   Runoff: Reconstitution of a severely diseased popliteal artery. Severely diseased trifurcation vessels. Dominant runoff appears to be via diseased anterior tibial artery.   LEFT Lower Extremity   Inflow: Left common iliac, external iliac arteries are occluded. Stent seen within the left external iliac artery. There is brief reconstitution of the left common femoral artery which is severely diseased and stenotic.   Outflow: Occlusion of the left superficial femoral artery and deep femoral artery.   Runoff: No visible significant reconstitution in the left calf.   Veins: No obvious venous abnormality within the limitations of this arterial phase study.   Review of the MIP images confirms the above findings.   NON-VASCULAR   Lower chest: No acute abnormality.   Hepatobiliary: Innumerable subcentimeter low-density lesions throughout the liver, likely small cysts. Gallbladder unremarkable.   Pancreas: No focal abnormality or ductal dilatation.   Spleen: No focal abnormality.  Normal size.   Adrenals/Urinary Tract: Adrenal glands normal. Bilateral renal cysts, the largest in the upper pole on the right measuring 2.7  cm. No follow-up imaging recommended. No hydronephrosis. Urinary bladder grossly unremarkable.   Stomach/Bowel: Left colonic diverticulosis. No active diverticulitis. Normal appendix. Stomach and small bowel decompressed, unremarkable.   Lymphatic: No adenopathy   Reproductive: Prostate enlargement.   Other: No free fluid or free air.   Musculoskeletal: No acute bony abnormality.   IMPRESSION: VASCULAR   Heavily calcified and severely diseased aorta and mesenteric vessels.   Severely diseased iliofemoral vessels on the right. Prior standing in the right common and external iliac arteries.     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 Dominant runoff in the right lower extremity via a diseased anterior tibial artery.   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   Reconstitution of a severely diseased common femoral artery with some flow in a severely diseased deep femoral artery. Left SFA and popliteal arteries occluded. No significant reconstitution in the left calf.   NON-VASCULAR   Left colonic diverticulosis. No active diverticulitis.   No acute findings in the abdomen or pelvis.    Statin:  Yes.   Beta Blocker:  Yes.   Aspirin:  No. ACEI:  Yes.   ARB:  No. CCB use:  No Other antiplatelets/anticoagulants:  Yes.   Plavix 75 mg daily   ASSESSMENT/PLAN: This is a 67 y.o. male who presents to [facility]'s emergency department with left lower extremity pain 8 out of 10 today.  Upon presentation it was noted patient's left lower extremity is very cold to touch from his toes to the distal part of his left knee.  Unable to get a Doppler pulses in the left lower extremity today.  Right lower extremity Doppler pulses were +1 and very faint.  It was explained to the patient after pulses were unable to be obtained that today's procedure has a very low likelihood of restoring any blood flow  and saving patient's leg.  More than likely he may need open surgery in the very near future or possible left lower extremity amputation.  Patient again verbalizes his understanding.  PLAN: Vascular surgery plans on taking the patient to the vascular lab this afternoon on 11/02/2022 for left lower extremity angiogram with possible intervention.  I discussed in detail with the patient the procedure, benefits, risks, and complications.  Patient verbalizes understanding.  I answered all the patient's questions.  Patient would like to proceed as soon as possible.  Patient last states he ate breakfast this morning at around 7 AM.  Patient was made n.p.o. and vitals all remained stable.   -I discussed the plan in detail with Dr. Jason Dew MD and he is in agreement with the plan.   Zohair Epp R Melynda Krzywicki [facility] 11/02/2022 1:09 PM  

## 2022-11-03 ENCOUNTER — Inpatient Hospital Stay (HOSPITAL_COMMUNITY)
Admit: 2022-11-03 | Discharge: 2022-11-03 | Disposition: A | Discharge status: Home / Self Care | Payer: Medicare Other | Attending: Cardiovascular Disease | Admitting: Cardiovascular Disease

## 2022-11-03 ENCOUNTER — Encounter: Payer: Self-pay | Admitting: Vascular Surgery

## 2022-11-03 DIAGNOSIS — I429 Cardiomyopathy, unspecified: Secondary | ICD-10-CM

## 2022-11-03 DIAGNOSIS — I2510 Atherosclerotic heart disease of native coronary artery without angina pectoris: Secondary | ICD-10-CM | POA: Diagnosis not present

## 2022-11-03 DIAGNOSIS — I739 Peripheral vascular disease, unspecified: Secondary | ICD-10-CM | POA: Diagnosis not present

## 2022-11-03 DIAGNOSIS — R9431 Abnormal electrocardiogram [ECG] [EKG]: Secondary | ICD-10-CM | POA: Diagnosis not present

## 2022-11-03 DIAGNOSIS — I25118 Atherosclerotic heart disease of native coronary artery with other forms of angina pectoris: Secondary | ICD-10-CM

## 2022-11-03 DIAGNOSIS — I639 Cerebral infarction, unspecified: Secondary | ICD-10-CM

## 2022-11-03 DIAGNOSIS — W19XXXA Unspecified fall, initial encounter: Secondary | ICD-10-CM | POA: Diagnosis not present

## 2022-11-03 DIAGNOSIS — F1010 Alcohol abuse, uncomplicated: Secondary | ICD-10-CM | POA: Diagnosis not present

## 2022-11-03 DIAGNOSIS — I998 Other disorder of circulatory system: Secondary | ICD-10-CM | POA: Diagnosis not present

## 2022-11-03 LAB — CBC
HCT: 38.3 % — ABNORMAL LOW (ref 39.0–52.0)
Hemoglobin: 12.7 g/dL — ABNORMAL LOW (ref 13.0–17.0)
MCH: 29.3 pg (ref 26.0–34.0)
MCHC: 33.2 g/dL (ref 30.0–36.0)
MCV: 88.5 fL (ref 80.0–100.0)
Platelets: 148 10*3/uL — ABNORMAL LOW (ref 150–400)
RBC: 4.33 MIL/uL (ref 4.22–5.81)
RDW: 14.6 % (ref 11.5–15.5)
WBC: 10.2 10*3/uL (ref 4.0–10.5)
nRBC: 0 % (ref 0.0–0.2)

## 2022-11-03 LAB — ECHOCARDIOGRAM COMPLETE
AR max vel: 2.6 cm2
AV Area VTI: 2.55 cm2
AV Area mean vel: 2.54 cm2
AV Mean grad: 3 mmHg
AV Peak grad: 7 mmHg
Ao pk vel: 1.32 m/s
Area-P 1/2: 4.8 cm2
Calc EF: 41.1 %
Height: 67 in
MV VTI: 2.32 cm2
S' Lateral: 3.7 cm
Single Plane A2C EF: 42.2 %
Single Plane A4C EF: 37.4 %
Weight: 2825.42 oz

## 2022-11-03 LAB — COMPREHENSIVE METABOLIC PANEL
ALT: 75 U/L — ABNORMAL HIGH (ref 0–44)
AST: 408 U/L — ABNORMAL HIGH (ref 15–41)
Albumin: 3.3 g/dL — ABNORMAL LOW (ref 3.5–5.0)
Alkaline Phosphatase: 49 U/L (ref 38–126)
Anion gap: 9 (ref 5–15)
BUN: 11 mg/dL (ref 8–23)
CO2: 24 mmol/L (ref 22–32)
Calcium: 8.4 mg/dL — ABNORMAL LOW (ref 8.9–10.3)
Chloride: 106 mmol/L (ref 98–111)
Creatinine, Ser: 1 mg/dL (ref 0.61–1.24)
GFR, Estimated: >60 mL/min (ref >60)
Glucose, Bld: 106 mg/dL — ABNORMAL HIGH (ref 70–99)
Potassium: 3 mmol/L — ABNORMAL LOW (ref 3.5–5.1)
Sodium: 139 mmol/L (ref 135–145)
Total Bilirubin: 0.9 mg/dL (ref 0.3–1.2)
Total Protein: 6.3 g/dL — ABNORMAL LOW (ref 6.5–8.1)

## 2022-11-03 LAB — HEPARIN LEVEL (UNFRACTIONATED)
Heparin Unfractionated: 0.82 IU/mL — ABNORMAL HIGH (ref 0.30–0.70)
Heparin Unfractionated: 0.68 IU/mL (ref 0.30–0.70)
Heparin Unfractionated: 0.62 IU/mL (ref 0.30–0.70)

## 2022-11-03 LAB — CK: Total CK: 13747 U/L — ABNORMAL HIGH (ref 49–397)

## 2022-11-03 MED ORDER — METOPROLOL SUCCINATE ER 50 MG PO TB24
50.0000 mg | ORAL_TABLET | Freq: Every day | ORAL | Status: DC
  Administered 2022-11-03 – 2022-11-22 (×19): 50 mg via ORAL
  Filled 2022-11-03 (×19): qty 1

## 2022-11-03 MED ORDER — ATORVASTATIN CALCIUM 20 MG PO TABS
80.0000 mg | ORAL_TABLET | Freq: Every evening | ORAL | Status: DC
  Administered 2022-11-03 – 2022-11-21 (×19): 80 mg via ORAL
  Filled 2022-11-03 (×3): qty 1
  Filled 2022-11-03: qty 4
  Filled 2022-11-03 (×6): qty 1
  Filled 2022-11-03: qty 4
  Filled 2022-11-03 (×3): qty 1
  Filled 2022-11-03 (×2): qty 4
  Filled 2022-11-03 (×3): qty 1

## 2022-11-03 MED ORDER — ASPIRIN 81 MG PO TBEC
81.0000 mg | DELAYED_RELEASE_TABLET | Freq: Every day | ORAL | Status: DC
  Administered 2022-11-03 – 2022-11-22 (×19): 81 mg via ORAL
  Filled 2022-11-03 (×19): qty 1

## 2022-11-03 MED ORDER — AMLODIPINE BESYLATE 10 MG PO TABS
10.0000 mg | ORAL_TABLET | Freq: Every day | ORAL | Status: DC
  Administered 2022-11-03 – 2022-11-05 (×2): 10 mg via ORAL
  Filled 2022-11-03 (×2): qty 1

## 2022-11-03 MED ORDER — POTASSIUM CHLORIDE CRYS ER 20 MEQ PO TBCR
40.0000 meq | EXTENDED_RELEASE_TABLET | Freq: Once | ORAL | Status: AC
  Administered 2022-11-03: 40 meq via ORAL
  Filled 2022-11-03: qty 2

## 2022-11-03 MED ORDER — STROKE: EARLY STAGES OF RECOVERY BOOK: Freq: Once | Status: AC

## 2022-11-03 NOTE — Progress Notes (Signed)
S: Lying comfortably in bed  O: BP (!) 167/68 (BP Location: Left Arm)   Pulse 63   Temp 98.4 F (36.9 C)   Resp 18   Ht 5\' 7"  (1.702 m)   Wt 80.1 kg   SpO2 99%   BMI 27.66 kg/m   HEENT: [place]/AT Lungs: Respirations unlabored Ext: No edema  Neurological Examination Mental Status: Awake and alert. Partially oriented. Speech is sparse but fluent. Can name some objects but not others. Able to follow all basic motor commands and answer simple questions. No dysarthria.  Cranial Nerves: II: Fixates and tracks normally III,IV, VI: No ptosis. EOMI with saccadic pursuits noted. VII: Smile symmetric VIII: Hearing intact to voice IX,X: No hoarseness.  XI: Symmetric Motor: RUE: with chronic mild flexion contracture at elbow, wrist and MCP joints. Strength is 2-3/5 deltoid, biceps and triceps, with weak grip in the context of flexion contractures. Severely increased flexor tone. Exerts less effort with this limb than on initial consult exam.   LUE: 5/5 RLE: 4+/5 LLE: 5/5 Gait: Deferred  Imaging: MRI brain official report describes a punctate subacute infarct in the right cerebellar hemisphere. On personal review of the images by Neurology, this appears most likely to be incidental/artifactual due to a lack of definite correlate on the ADC images (ADC images are used to verify the acuity of hyperintense lesions seen on the DWI images). Also seen is a large chronic left ACA-MCA ischemic infarction with associated loss of the left ICA flow void, favored to be chronic in the setting of the old left ACA-MCA border zone infarct.   A/R: 67 year old vasculopath with large chronic left ACA-MCA territory ischemic infarction and chronic right sided weakness who presented with AMS after a fall.  - MRI brain with artifactual punctate right cerebellar hyperintensity on DWI and old large left ACA-MCA territory stroke.  - Imaging does not explain his acute presentation, but does explain his right sided motor  deficits.  - Fall etiology most likely multifactorial with ischemic left foot secondary to severe PAD felt to be a significant instigating factor - Vascular Surgery is consulting for severe PAD affecting the LLE with plan for endarterectomy/stenting in order to optimize for planned left AKA  - Cleared for surgery from a Neurology standpoint. Although a patient with prior stroke and severe vascular disease is more likely to sustain a perioperative stroke than a healthy individual, the benefits of surgical management of his LLE outweigh the risks.  - Also has been diagnosed with left femoral and popliteal vein DVT. Now on heparin - After surgical intervention likely will need to be started on Plavix  - DDx for initial presentation with AMS includes possible EtOH withdrawal and toxic/metabolic.  - Agree with CIWA protocol and thiamine supplementation - Neurohospitalist service will sign off. Please call if there are additional questions.   Electronically signed: Dr. Caryl Pina

## 2022-11-03 NOTE — Progress Notes (Signed)
ANTICOAGULATION CONSULT NOTE  Pharmacy Consult for heparin infusion Indication: DVT  No Known Allergies  Patient Measurements: Height: 5\' 7"  (170.2 cm) Weight: 80.1 kg (176 lb 9.4 oz) IBW/kg (Calculated) : 66.1 Heparin Dosing Weight: 72.9 kg  Vital Signs: Temp: 98.4 F (36.9 C) (06/13 0413) BP: 167/68 (06/13 0413) Pulse Rate: 63 (06/13 0413)  Labs: Recent Labs    11/01/22 1843 11/01/22 2001 11/02/22 0625 11/02/22 1315 11/02/22 2240 11/03/22 0545  HGB 14.5  --  13.5  --   --  12.7*  HCT 44.9  --  41.8  --   --  38.3*  PLT 156  --  159  --   --  148*  APTT 27  --   --   --   --   --   LABPROT 13.8  --   --   --   --   --   INR 1.0  --   --   --   --   --   HEPARINUNFRC  --   --   --  >1.10* 0.98* 0.82*  CREATININE 1.08  --  0.98  --   --  1.00  CKTOTAL  --  14,578*  --   --   --   --      Estimated Creatinine Clearance: 72.7 mL/min (by C-G formula based on SCr of 1 mg/dL).   Medical History: Past Medical History:  Diagnosis Date   Alcohol use disorder, mild, abuse    COPD (chronic obstructive pulmonary disease) (HCC)    Gout    Hyperlipidemia    Hypertension    Hypothyroid    Peripheral vascular complication    Stroke [facility])     Assessment: Pt is a 67 yo male presenting to ED for fall and c/o left leg pain, found with multiple arterial occlusions in lower extremities.Pt w/ med hx of Eliquis in the past, but stated he has not been taking medication recently.  Date Time HL 6/12 1315  >1.1 6/12 2240 0.98, supratherapeutic 6/13 0545 0.82, supratherapeutic  Goal of Therapy:  Heparin level 0.3-0.7 units/ml Monitor platelets by anticoagulation protocol: Yes   Plan:  Decrease heparin infusion to 700 units/hr Recheck HL in 6 hr after rate change CBC daily while on heparin  Otelia Sergeant, PharmD, [facility] 11/03/2022 6:46 AM

## 2022-11-03 NOTE — Progress Notes (Signed)
ANTICOAGULATION CONSULT NOTE  Pharmacy Consult for Heparin Infusion Indication: DVT  Patient Measurements: Height: 5\' 7"  (170.2 cm) Weight: 80.1 kg (176 lb 9.4 oz) IBW/kg (Calculated) : 66.1 Heparin Dosing Weight: 72.9 kg  Labs: Recent Labs    11/01/22 1843 11/01/22 2001 11/02/22 0625 11/02/22 1315 11/03/22 0545 11/03/22 1255 11/03/22 1909  HGB 14.5  --  13.5  --  12.7*  --   --   HCT 44.9  --  41.8  --  38.3*  --   --   PLT 156  --  159  --  148*  --   --   APTT 27  --   --   --   --   --   --   LABPROT 13.8  --   --   --   --   --   --   INR 1.0  --   --   --   --   --   --   HEPARINUNFRC  --   --   --    < > 0.82* 0.68 0.62  CREATININE 1.08  --  0.98  --  1.00  --   --   CKTOTAL  --  14,578*  --   --  13,747*  --   --    < > = values in this interval not displayed.    Estimated Creatinine Clearance: 72.7 mL/min (by C-G formula based on SCr of 1 mg/dL).  Medical History: Past Medical History:  Diagnosis Date   Alcohol use disorder, mild, abuse    COPD (chronic obstructive pulmonary disease) (HCC)    Gout    Hyperlipidemia    Hypertension    Hypothyroid    Peripheral vascular complication    Stroke [facility])     Assessment: Pt is a 67 yo male presenting to ED for fall and c/o left leg pain, found with multiple arterial occlusions in lower extremities.Pt w/ med hx of Eliquis in the past, but stated he has not been taking medication recently.  Date Time HL 6/12 1315  >1.1 6/12 2240 0.98, supratherapeutic 6/13 0545 0.82, supratherapeutic 6/13 1255 0.68, therapeutic x 1 6/13 1909 0.62, therapeutic x 2  Goal of Therapy:  Heparin level 0.3-0.7 units/ml Monitor platelets by anticoagulation protocol: Yes   Plan:  --Continue heparin infusion at 700 units/hr --Recheck HL and CBC tomorrow AM  Tressie Ellis 11/03/2022 7:45 PM

## 2022-11-03 NOTE — Progress Notes (Signed)
ANTICOAGULATION CONSULT NOTE  Pharmacy Consult for heparin infusion Indication: DVT  No Known Allergies  Patient Measurements: Height: 5\' 7"  (170.2 cm) Weight: 72.9 kg (160 lb 11.5 oz) IBW/kg (Calculated) : 66.1 Heparin Dosing Weight: 72.9 kg  Vital Signs: Temp: 98.4 F (36.9 C) (06/12 2347) Temp Source: Oral (06/12 1730) BP: 157/79 (06/12 2347) Pulse Rate: 79 (06/12 2347)  Labs: Recent Labs    11/01/22 1843 11/01/22 2001 11/02/22 0625 11/02/22 1315 11/02/22 2240  HGB 14.5  --  13.5  --   --   HCT 44.9  --  41.8  --   --   PLT 156  --  159  --   --   APTT 27  --   --   --   --   LABPROT 13.8  --   --   --   --   INR 1.0  --   --   --   --   HEPARINUNFRC  --   --   --  >1.10* 0.98*  CREATININE 1.08  --  0.98  --   --   CKTOTAL  --  14,578*  --   --   --      Estimated Creatinine Clearance: 68.4 mL/min (by C-G formula based on SCr of 0.98 mg/dL).   Medical History: Past Medical History:  Diagnosis Date   Alcohol use disorder, mild, abuse    COPD (chronic obstructive pulmonary disease) (HCC)    Gout    Hyperlipidemia    Hypertension    Hypothyroid    Peripheral vascular complication    Stroke [facility])     Assessment: Pt is a 67 yo male presenting to ED for fall and c/o left leg pain, found with multiple arterial occlusions in lower extremities.Pt w/ med hx of Eliquis in the past, but stated he has not been taking medication recently.  Date Time HL 6/12 1315  >1.1 6/12 2240 0.98, supratherapeutic  Goal of Therapy:  Heparin level 0.3-0.7 units/ml Monitor platelets by anticoagulation protocol: Yes   Plan:  Decrease heparin infusion to 800 units/hr Recheck HL every 6 hr after rate change CBC daily while on heparin  Otelia Sergeant, PharmD, [facility] 11/03/2022 12:11 AM

## 2022-11-03 NOTE — Progress Notes (Signed)
*  PRELIMINARY RESULTS* Echocardiogram 2D Echocardiogram has been performed.  Carolyne Fiscal 11/03/2022, 3:21 PM

## 2022-11-03 NOTE — Progress Notes (Signed)
PROGRESS NOTE    Corey Olson   FAO:[id number] DOB: 03/25/1956  DOA: 11/01/2022 Date of Service: 11/03/22 PCP: [name], MD     Brief Narrative / Hospital Course:  Corey Olson is a 67 y.o. male with a history of stroke, peripheral atrial disease who presents with altered mental status, left leg weakness.  Family spoke to him at 430 and felt that he was confused.  Prior to that he had not been spoken to since the night before when he had seemed normal.  Patient reports left leg weakness dragging his left foot while walking and decreased sensation, all of which were new but without clear timelines. 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  06/11: in ED, VSS, code stroke called Per neurology - recs MRI brain, no TPN. Admitted to hospitalist service. Elevated AST/ALT c/w EtOH but EtOH levels <10 and RUQ [place] (+)hepatic steatosis, , Lactic acid ok at 1.3, CBC no concerns, elevated D-Dimer, CK >14,000 and UA no UTI but (+)Hgb no significant RBC c/w rhabdo, EtOH <10, UDS neg. [place] bilateral LE (+)DVT L femoral and popliteal V.  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 Found to have weakneed pulses LLE and CTA runoff performed showing ischemia/severe PAD, on exam LLE is cold below mid-tibia so vascular consulted, continue heparin gtt, taken to cath lab for RLE and LLE angiogram. Significant vascular disease, limb likely not salvageable per Dr Wyn Quaker - prognosis poor for even AKA healing, will likely need extensive endarterectomy/stenting to even salvage an AKA. Can plan for surgery later this week, will need cardiac clearance  06/13: anticipate surgery tomorrow    Consultants:  Neurology Vascular surgery  Cardiology   Procedures: Bilateral LE angiogram 11/02/22 w/ Dr Wyn Quaker       ASSESSMENT & PLAN:   Principal Problem:   Fall Active Problems:    Fall at home, initial encounter   Tobacco abuse   PAD (peripheral artery disease) (HCC)   Essential hypertension   Alcohol abuse   RUE weakness   Non-traumatic rhabdomyolysis   Positive D dimer   Abnormal EKG   LLE w/ severe PAD, ischemic L foot w/ neurological deficits likely cause for fall S/p angiography  Vascular surgery following, planning surgical intervention w/ endarterectomy/stenting later this week to optimize for AKA after that Cardiology consult for perioperative risk stratification Discussed w/ patient re: plan and prognosis today - explained in plain language re: circulation to foot cannot be restored and will need to be amputated or else that part of the body will die and cause other problems, circulation needs to be fixed higher up before the amputation can happen - he is in agreement with the plan and all questions answered. He understands his situation and the alternatives, including option to not undergo surgery. Plan proceed w/ surgery per vascular team.   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 HTN HLD Previous Lexiscan w/o ischemia Per cardiology note 02/2021 "high risk, if he develops chest pain with exertion or dyspnea, will schedule left heart cath." consult to cardiology [facility]) to assess perioperative risk, consider ischemic evaluation  Continuing on heparin infusion for now  We were holding statin given transaminitis but risk of holding may be greater than risk to liver, will monitor LFT's closely  Weakness and fall Confusion / AMS question toxic metabolic encephalopathy or CVA Likely multifactorial  Pressing concern for ischemic LLE as above Concern for CVA w/ MRI brain pending Potential EtOH withdrawal  Treat multiple underlying issues  PT/OT to eval when more stable  Fall precaution   DVT L femoral and popliteal V  Heparin gtt Vascular surgery following but no intervention needed at this time for the DVT    Rhabdomyolysis w/ normal renal  function  IV fluids Follow renal function and CK   History CVA w/ residual R sided deficits (+)subacute R cerebellar punctate infarct on MRI ASA We were holding statin given transaminitis but risk of holding may be greater than risk to liver, will monitor LFT's closely Neurology following  On heparin gtt for DVT and ischemic leg  Transaminitis  Hepatic Steatosis on RUQ [place] EtOH use but no acute intoxication based on blood levels - question alcoholic hepatitis  Follow CMP Hepatitis panel pending  We were holding statin given transaminitis but risk of holding may be greater than risk to liver, will monitor LFT's closely  EtOH use but no acute intoxication based on blood levels  CIWA protocol  Thiamine  Hypokalemia Replace as needed Monitor BMP  Essential HTN Permissive HTN in light of CVA - can begin to lower BP goal   Tobacco abuse Nicotine patch     DVT: treating w/ heparin  Pertinent IV fluids/nutrition: NS 100 mL/h Central lines / invasive devices: none  Code Status: FULL CODE ACP documentation reviewed: 11/02/22 none on file   Current Admission Status: inpatient   TOC needs / Dispo plan: TBD expect will need SNF Barriers to discharge / significant pending items: vascular surgery for ischemic LLE              Subjective / Brief ROS:  Patient reports no concerns Denies CP/SOB.  Pain controlled.  Denies new weakness.  Tolerating diet.    Family Communication: none at this time, pt decliend call to family     Objective Findings:  Vitals:   11/02/22 1925 11/02/22 2347 11/03/22 0413 11/03/22 1207  BP: 118/79 (!) 157/79 (!) 167/68 (!) 157/71  Pulse: (!) 107 79 63 82  Resp: 18 18 18 16   Temp: 98.4 F (36.9 C) 98.4 F (36.9 C) 98.4 F (36.9 C) 98 F (36.7 C)  TempSrc:      SpO2: 100% 100% 99% 97%  Weight:   80.1 kg   Height:        Intake/Output Summary (Last 24 hours) at 11/03/2022 1457 Last data filed at 11/03/2022 1350 Gross per 24  hour  Intake 828.25 ml  Output 1300 ml  Net -471.75 ml   Filed Weights   11/01/22 1841 11/03/22 0413  Weight: 72.9 kg 80.1 kg    Examination:  Physical Exam Constitutional:      General: He is not in acute distress. Cardiovascular:     Rate and Rhythm: Normal rate and regular rhythm.  Pulmonary:     Effort: Pulmonary effort is normal. No respiratory distress.     Breath sounds: No rhonchi.  Abdominal:     General: There is no distension.     Palpations: Abdomen is soft.  Skin:    Comments: Cool to touch LLE distal to mid-tibia   Neurological:     Mental Status: He is alert.     Motor: Weakness present.  Psychiatric:        Behavior: Behavior normal.          Scheduled Medications:   [START ON 11/04/2022]  stroke: early stages of  recovery book   Does not apply Once   amLODipine  10 mg Oral Daily   aspirin EC  81 mg Oral Daily   atorvastatin  80 mg Oral QPM   metoprolol succinate  50 mg Oral Daily   nicotine  21 mg Transdermal Daily   pantoprazole  40 mg Oral BID   sodium chloride flush  3 mL Intravenous Q12H   thiamine  100 mg Oral Daily    Continuous Infusions:  sodium chloride 100 mL/hr at 11/03/22 1013   heparin 700 Units/hr (11/03/22 0835)    PRN Medications:  acetaminophen **OR** acetaminophen  Antimicrobials from admission:  Anti-infectives (From admission, onward)    Start     Dose/Rate Route Frequency Ordered Stop   11/02/22 1431  ceFAZolin (ANCEF) IVPB 2g/100 mL premix        2 g 200 mL/hr over 30 Minutes Intravenous 30 min pre-op 11/02/22 1431 11/02/22 1653           Data Reviewed:  I have personally reviewed the following...  CBC: Recent Labs  Lab 11/01/22 1843 11/02/22 0625 11/03/22 0545  WBC 9.3 9.7 10.2  NEUTROABS 7.5  --   --   HGB 14.5 13.5 12.7*  HCT 44.9 41.8 38.3*  MCV 90.3 90.1 88.5  PLT 156 159 148*   Basic Metabolic Panel: Recent Labs  Lab 11/01/22 1843 11/01/22 2001 11/02/22 0625 11/03/22 0545  NA 136   --  139 139  K 3.5  --  3.4* 3.0*  CL 102  --  104 106  CO2 19*  --  21* 24  GLUCOSE 88  --  84 106*  BUN 14  --  12 11  CREATININE 1.08  --  0.98 1.00  CALCIUM 8.5*  --  7.8* 8.4*  MG  --  2.0  --   --    GFR: Estimated Creatinine Clearance: 72.7 mL/min (by C-G formula based on SCr of 1 mg/dL). Liver Function Tests: Recent Labs  Lab 11/01/22 1843 11/02/22 0625 11/03/22 0545  AST 296* 366* 408*  ALT 63* 74* 75*  ALKPHOS 55 57 49  BILITOT 1.3* 1.6* 0.9  PROT 7.0 6.6 6.3*  ALBUMIN 4.2 3.7 3.3*   Recent Labs  Lab 11/01/22 2001  LIPASE 27   No results for input(s): "AMMONIA" in the last 168 hours. Coagulation Profile: Recent Labs  Lab 11/01/22 1843  INR 1.0   Cardiac Enzymes: Recent Labs  Lab 11/01/22 2001 11/03/22 0545  CKTOTAL 14,578* 13,747*   BNP (last 3 results) No results for input(s): "PROBNP" in the last 8760 hours. HbA1C: No results for input(s): "HGBA1C" in the last 72 hours. CBG: Recent Labs  Lab 11/01/22 1842 11/02/22 0215  GLUCAP 91 84   Lipid Profile: No results for input(s): "CHOL", "HDL", "LDLCALC", "TRIG", "CHOLHDL", "LDLDIRECT" in the last 72 hours. Thyroid Function Tests: No results for input(s): "TSH", "T4TOTAL", "FREET4", "T3FREE", "THYROIDAB" in the last 72 hours. Anemia Panel: No results for input(s): "VITAMINB12", "FOLATE", "FERRITIN", "TIBC", "IRON", "RETICCTPCT" in the last 72 hours. Most Recent Urinalysis On File:     Component Value Date/Time   COLORURINE YELLOW (A) 11/01/2022 2011   APPEARANCEUR CLEAR (A) 11/01/2022 2011   LABSPEC 1.023 11/01/2022 2011   PHURINE 5.0 11/01/2022 2011   GLUCOSEU NEGATIVE 11/01/2022 2011   HGBUR LARGE (A) 11/01/2022 2011   BILIRUBINUR NEGATIVE 11/01/2022 2011   KETONESUR 80 (A) 11/01/2022 2011   PROTEINUR 100 (A) 11/01/2022 2011   NITRITE NEGATIVE 11/01/2022 2011  LEUKOCYTESUR NEGATIVE 11/01/2022 2011   Sepsis Labs: @LABRCNTIP (procalcitonin:4,lacticidven:4) Microbiology: No results  found for this or any previous visit (from the past 240 hour(s)).    Radiology Studies last 3 days: PERIPHERAL VASCULAR CATHETERIZATION  Result Date: 11/02/2022 See surgical note for result.  MR BRAIN WO CONTRAST  Result Date: 11/02/2022 CLINICAL DATA:  Stroke, follow-up. EXAM: MRI HEAD WITHOUT CONTRAST TECHNIQUE: Multiplanar, multiecho pulse sequences of the brain and surrounding structures were obtained without intravenous contrast. COMPARISON:  Head CT 11/01/2022. FINDINGS: Brain: Punctate subacute infarct in the right cerebellar hemisphere (image 7 series 5). No acute hemorrhage or significant mass effect. Encephalomalacia in the high left frontal lobe from prior ACA-MCA border zone infarct. No hydrocephalus or extra-axial collection. No mass or midline shift. 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 Skull and upper cervical spine: Normal marrow signal. Sinuses/Orbits: Unremarkable. Other: None. IMPRESSION: 1. Punctate subacute infarct in the right cerebellar hemisphere. No acute hemorrhage or significant mass effect. 2. 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 Electronically Signed   By: Orvan Falconer M.D.   On: 11/02/2022 13:28   CT ANGIO AO+BIFEM W & OR WO CONTRAST  Result Date: 11/02/2022 CLINICAL DATA:  Cold right foot, weak pulse. Left foot cold, no palpable pulse. EXAM: CT ANGIOGRAPHY OF ABDOMINAL AORTA WITH ILIOFEMORAL RUNOFF TECHNIQUE: Multidetector CT imaging of the abdomen, pelvis and lower extremities was performed using the standard protocol during bolus administration of intravenous contrast. Multiplanar CT image reconstructions and MIPs were obtained to evaluate the vascular anatomy. RADIATION DOSE REDUCTION: This exam was performed according to the departmental dose-optimization program which includes automated exposure control, adjustment of the  mA and/or kV according to patient size and/or use of iterative reconstruction technique. CONTRAST:  OMNIPAQUE IOHEXOL 350 MG/ML SOLN COMPARISON:  None Available. FINDINGS: VASCULAR Aorta: Extensive atherosclerosis.  No aneurysm. Celiac: Extensive atherosclerosis. Widely patent. Vessel is diffusely ectatic. SMA: Arises from the celiac artery with extensive calcifications. Mild narrowing within the proximal to mid SMA. Renals: Single renal arteries bilaterally, heavily calcified, suspect moderate left renal artery stenosis. IMA: Patent RIGHT Lower Extremity Inflow: Heavily calcified common, internal and external right iliac artery. Stent seen within the right common carotid artery extending to near the common femoral artery. 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 Common and external iliac arteries patent. Outflow: Heavily calcified disease common femoral artery. There is occlusion of the proximal right SFA. Deep femoral artery heavily disease proximally with likely severe proximal stenosis. Contrast is seen within the vessel presumably related to collaterals. Runoff: Reconstitution of a severely diseased popliteal artery. Severely diseased trifurcation vessels. Dominant runoff appears to be via diseased anterior tibial artery. LEFT Lower Extremity Inflow: Left common iliac, external iliac arteries are occluded. Stent seen within the left external iliac artery. There is brief reconstitution of the left common femoral artery which is severely diseased and stenotic. Outflow: Occlusion of the left superficial femoral artery and deep femoral artery. Runoff: No visible significant reconstitution in the left calf. Veins: No obvious venous abnormality within the limitations of this arterial phase study. Review of the MIP images confirms the above findings. NON-VASCULAR Lower chest: No acute abnormality. Hepatobiliary: Innumerable subcentimeter low-density lesions throughout the liver, likely small cysts. Gallbladder  unremarkable. Pancreas: No focal abnormality or ductal dilatation. Spleen: No focal abnormality.  Normal size. Adrenals/Urinary Tract: Adrenal glands normal. Bilateral renal cysts,  the largest in the upper pole on the right measuring 2.7 cm. No follow-up imaging recommended. No hydronephrosis. Urinary bladder grossly unremarkable. Stomach/Bowel: Left colonic diverticulosis. No active diverticulitis. Normal appendix. Stomach and small bowel decompressed, unremarkable. Lymphatic: No adenopathy Reproductive: Prostate enlargement. Other: No free fluid or free air. Musculoskeletal: No acute bony abnormality. 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 Severely diseased iliofemoral vessels on the right. Prior standing in the right common and external iliac arteries. 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 Dominant runoff in the right lower extremity via a diseased anterior tibial artery. 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 Reconstitution of a severely diseased common femoral artery with some flow in a severely diseased deep femoral artery. Left SFA and popliteal arteries occluded. No significant reconstitution in the left calf. NON-VASCULAR Left colonic diverticulosis. No active diverticulitis. No acute findings in the abdomen or pelvis. Electronically Signed   By: Charlett Nose M.D.   On: 11/02/2022 02:38   US Venous Img Lower Bilateral (DVT)  Result Date: 11/02/2022 CLINICAL DATA:  Bilateral leg pain. EXAM: BILATERAL LOWER EXTREMITY VENOUS DOPPLER ULTRASOUND TECHNIQUE: Gray-scale sonography with graded compression, as well as color Doppler and duplex ultrasound were performed to evaluate the lower extremity deep venous systems from the level of the common femoral vein and including the common femoral, femoral, profunda femoral, popliteal and calf veins including the  posterior tibial, peroneal and gastrocnemius veins when visible. The superficial great saphenous vein was also interrogated. Spectral Doppler was utilized to evaluate flow at rest and with distal augmentation maneuvers in the common femoral, femoral and popliteal veins. COMPARISON:  None Available. FINDINGS: RIGHT LOWER EXTREMITY Common Femoral Vein: No evidence of thrombus. Normal compressibility, respiratory phasicity and response to augmentation. Saphenofemoral Junction: No evidence of thrombus. Normal compressibility and flow on color Doppler imaging. Profunda Femoral Vein: No evidence of thrombus. Normal compressibility and flow on color Doppler imaging. Femoral Vein: No evidence of thrombus. Normal compressibility, respiratory phasicity and response to augmentation. Popliteal Vein: No evidence of thrombus. Normal compressibility, respiratory phasicity and response to augmentation. Calf Veins: No evidence of thrombus. Normal compressibility and flow on color Doppler imaging. Superficial Great Saphenous Vein: No evidence of thrombus. Normal compressibility. Venous Reflux:  None. Other Findings:  None. LEFT LOWER EXTREMITY Common Femoral Vein: No evidence of thrombus. Normal compressibility, respiratory phasicity and response to augmentation. Saphenofemoral Junction: No evidence of thrombus. Normal compressibility and flow on color Doppler imaging. Profunda Femoral Vein: No evidence of thrombus. Normal compressibility and flow on color Doppler imaging. Femoral Vein: Evidence of nonocclusive thrombus with abnormal compressibility, respiratory phasicity and response to augmentation. Popliteal Vein: Evidence of nonocclusive thrombus with abnormal compressibility, respiratory phasicity and response to augmentation. Calf Veins: No evidence of thrombus. Normal compressibility and flow on color Doppler imaging. Superficial Great Saphenous Vein: No evidence of thrombus. Normal compressibility. Venous Reflux:  None. Other  Findings:  None. IMPRESSION: 1. 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 2. No evidence of DVT within the RIGHT lower extremity. Electronically Signed   By: Aram Candela M.D.   On: 11/02/2022 00:55   US Abdomen Limited RUQ (LIVER/GB)  Result Date: 11/02/2022 CLINICAL DATA:  Abnormal liver function test. EXAM: ULTRASOUND ABDOMEN LIMITED RIGHT UPPER QUADRANT COMPARISON:  None Available. FINDINGS: Gallbladder: No gallstones or wall thickening visualized (3.2 mm). No sonographic Murphy sign noted by sonographer. Common bile  duct: Diameter: 3.8 mm Liver: Multiple small hepatic cysts are seen. The largest measures approximately 1.0 cm x 0.6 cm x 1.3 cm and is located within the left lobe of the liver. Diffusely increased echogenicity of the liver parenchyma is noted. Portal vein is patent on color Doppler imaging with normal direction of blood flow towards the liver. Other: Multiple simple cysts are seen within the right kidney. The largest measures approximately 3.4 cm x 2.8 cm x 2.8 cm. A small right pleural effusion is noted. IMPRESSION: 1. Hepatic steatosis with multiple hepatic and right renal cysts. 2. Small right pleural effusion. Electronically Signed   By: Aram Candela M.D.   On: 11/02/2022 00:52   CT HEAD CODE STROKE WO CONTRAST  Result Date: 11/01/2022 CLINICAL DATA:  Code stroke. Altered mental status, confusion, left-sided weakness and numbness EXAM: CT HEAD WITHOUT CONTRAST TECHNIQUE: Contiguous axial images were obtained from the base of the skull through the vertex without intravenous contrast. RADIATION DOSE REDUCTION: This exam was performed according to the departmental dose-optimization program which includes automated exposure control, adjustment of the mA and/or kV according to patient size and/or use of iterative reconstruction technique. COMPARISON:  11/22/2008 FINDINGS: Brain: No evidence of acute infarction, hemorrhage, mass, mass  effect, or midline shift. No hydrocephalus or extra-axial collection. 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 Vascular: No hyperdense vessel. Atherosclerotic calcifications in the intracranial carotid and vertebral arteries. Skull: Negative for fracture or focal lesion. Sinuses/Orbits: No acute finding. Other: The mastoid air cells are well aerated. ASPECTS [facility] Stroke Program Early CT Score) - Ganglionic level infarction (caudate, lentiform nuclei, internal capsule, insula, M1-M3 cortex): 7 - Supraganglionic infarction (M4-M6 cortex): 3 Total score (0-10 with 10 being normal): 10 IMPRESSION: 1. No acute intracranial process. 2. ASPECTS is 10 Code stroke imaging results were communicated on 11/01/2022 at 6:58 pm to provider [facility] via telephone, who verbally acknowledged these results. Electronically Signed   By: Wiliam Ke M.D.   On: 11/01/2022 18:59             LOS: 1 day    Time spent: 50 min    Sunnie Nielsen, DO [facility] 11/03/2022, 2:57 PM    Dictation software may have been used to generate the above note. Typos may occur and escape review in typed/dictated notes. Please contact Dr Lyn Hollingshead directly for clarity if needed.  Staff may message me via secure chat in Epic  but this may not receive an immediate response,  please page me for urgent matters!  If 7PM-7AM, please contact night coverage [email or web address]

## 2022-11-03 NOTE — Consult Note (Addendum)
Cardiology Consultation   Patient ID: Corey Olson MRN: [id number]; DOB: 04-May-1956  Admit date: 11/01/2022 Date of Consult: 11/03/2022  PCP:  [name], MD   [facility] Providers Cardiologist:  Debbe Odea, MD  Physician requesting consult: Dr. Lyn Hollingshead Reason for consult: Preop cardiovascular evaluation, PAD  Patient Profile:   Corey Olson is a 67 y.o. male with a hx of smoking, COPD, PAD, stroke with residual deficits on right, presenting with left leg weakness, confusion  History of Present Illness:   Corey Olson was last seen in cardiology clinic 2 years ago.  At that time echocardiogram and stress test showing stable cardiac disease Ejection fraction 45 to 50%, stress test February 2022 consistent with prior MI with predominantly fixed defect  Wife at the bedside provides most of the history which he confirms. He reports being able to ambulate with a cane or grocery cart, does lots of shopping at [facility].  Denies significant chest pain on exertion.  He does have some shortness of breath though wife reports breathing is improved since he has been inpatient as he has stopped smoking. Continues to smoke one half up to 1 pack/day.  Presenting to the ER with dragging left leg, decreased sensation left foot, slurred speech, chronic right-sided weakness Initial concern for stroke, seen by neurology, felt to have a cold left foot CTA with runoff showing severe occlusive lower extremity arterial disease Ultrasound of legs showing small nonocclusive DVT Initial EKG reading concerning for atrial fibrillation was started on heparin infusion  This morning sitting comfortably in bed, no complaints  Tentatively scheduled for left femoral endarterectomy tomorrow morning with vascular  Past Medical History:  Diagnosis Date   Alcohol use disorder, mild, abuse    COPD (chronic obstructive pulmonary disease) (HCC)    Gout    Hyperlipidemia    Hypertension     Hypothyroid    Peripheral vascular complication    Stroke [facility]     Past Surgical History:  Procedure Laterality Date   LOWER EXTREMITY ANGIOGRAPHY Right 08/25/2020   Procedure: LOWER EXTREMITY ANGIOGRAPHY;  Surgeon: Renford Dills, MD;  Location: [facility] INVASIVE CV LAB;  Service: Cardiovascular;  Laterality: Right;   LOWER EXTREMITY ANGIOGRAPHY Left 06/15/2021   Procedure: LOWER EXTREMITY ANGIOGRAPHY;  Surgeon: Renford Dills, MD;  Location: [facility] INVASIVE CV LAB;  Service: Cardiovascular;  Laterality: Left;   LOWER EXTREMITY ANGIOGRAPHY Left 11/02/2022   Procedure: Lower Extremity Angiography;  Surgeon: Annice Needy, MD;  Location: [facility] INVASIVE CV LAB;  Service: Cardiovascular;  Laterality: Left;     Home Medications:  Prior to Admission medications   Medication Sig Start Date End Date Taking? Authorizing Provider  acetaminophen (TYLENOL) 500 MG tablet Take 500 mg by mouth every 6 (six) hours as needed for moderate pain or mild pain (lEG PAIN).   Yes [provider]  albuterol (VENTOLIN HFA) 108 (90 Base) MCG/ACT inhaler Inhale 2 puffs into the lungs every 6 (six) hours as needed for wheezing or shortness of breath.   Yes [provider]  allopurinol (ZYLOPRIM) 300 MG tablet Take 300 mg by mouth daily.   Yes [provider]  aspirin 81 MG EC tablet Take 325 mg by mouth daily.   Yes [provider]  atorvastatin (LIPITOR) 80 MG tablet Take 80 mg by mouth daily.   Yes [provider]  budesonide-formoterol (SYMBICORT) 160-4.5 MCG/ACT inhaler Inhale 2 puffs into the lungs daily. 01/28/15  Yes [provider]  gabapentin (NEURONTIN)  100 MG capsule Take 100 mg by mouth 2 (two) times daily.   Yes [provider]  lisinopril (ZESTRIL) 20 MG tablet Take 20 mg by mouth daily. 06/16/20  Yes [provider]  metoprolol succinate (TOPROL-XL) 50 MG 24 hr tablet Take 50 mg by mouth daily. 08/09/22  Yes [provider]   amLODipine (NORVASC) 10 MG tablet Take 10 mg by mouth daily. Patient not taking: Reported on 11/01/2022 01/28/15   [provider]  APIXABAN Everlene Balls) VTE STARTER PACK (10MG  AND 5MG ) Take as directed on package: start with two-5mg  tablets twice daily for 7 days. On day 8, switch to one-5mg  tablet twice daily. Patient not taking: Reported on 06/10/2021 09/11/20   Merwyn Katos, MD  clopidogrel (PLAVIX) 75 MG tablet Take 1 tablet (75 mg total) by mouth daily. Patient not taking: Reported on 06/10/2021 08/26/20   Schnier, Latina Craver, MD    Inpatient Medications: Scheduled Meds:  nicotine  21 mg Transdermal Daily   pantoprazole  40 mg Oral BID   sodium chloride flush  3 mL Intravenous Q12H   thiamine  100 mg Oral Daily   Continuous Infusions:  sodium chloride 100 mL/hr at 11/03/22 1013   heparin 700 Units/hr (11/03/22 0835)   PRN Meds: acetaminophen **OR** acetaminophen  Allergies:   No Known Allergies  Social History:   Social History   Socioeconomic History   Marital status: Single    Spouse name: Not on file   Number of children: Not on file   Years of education: Not on file   Highest education level: Not on file  Occupational History   Not on file  Tobacco Use   Smoking status: Every Day    Packs/day: 1.00    Years: 50.00    Additional pack years: 0.00    Total pack years: 50.00    Types: Cigarettes   Smokeless tobacco: Never  Substance and Sexual Activity   Alcohol use: Yes    Comment: social   Drug use: Never   Sexual activity: Not on file  Other Topics Concern   Not on file  Social History Narrative   Not on file   Social Determinants of Health   Financial Resource Strain: Not on file  Food Insecurity: No Food Insecurity (11/02/2022)   Hunger Vital Sign    Worried About Running Out of Food in the Last Year: Never true    Ran Out of Food in the Last Year: Never true  Transportation Needs: No Transportation Needs (11/02/2022)   PRAPARE - Therapist, art (Medical): No    Lack of Transportation (Non-Medical): No  Physical Activity: Not on file  Stress: Not on file  Social Connections: Not on file  Intimate Partner Violence: Not At Risk (11/02/2022)   Humiliation, Afraid, Rape, and Kick questionnaire    Fear of Current or Ex-Partner: No    Emotionally Abused: No    Physically Abused: No    Sexually Abused: No    Family History:   Family History  Problem Relation Age of Onset   Heart attack Mother    Heart Problems Father    Heart attack Father      ROS:  Please see the history of present illness.  Review of Systems  Constitutional: Negative.   HENT: Negative.    Respiratory: Negative.    Cardiovascular: Negative.   Gastrointestinal: Negative.   Musculoskeletal: Negative.        Left leg weakness  Neurological: Negative.   Psychiatric/Behavioral: Negative.    All other systems reviewed and are negative.    Physical Exam/Data:   Vitals:   11/02/22 1730 11/02/22 1925 11/02/22 2347 11/03/22 0413  BP: (!) 140/56 118/79 (!) 157/79 (!) 167/68  Pulse: 71 (!) 107 79 63  Resp: 20 18 18 18   Temp: 98.3 F (36.8 C) 98.4 F (36.9 C) 98.4 F (36.9 C) 98.4 F (36.9 C)  TempSrc: Oral     SpO2: 98% 100% 100% 99%  Weight:    80.1 kg  Height:        Intake/Output Summary (Last 24 hours) at 11/03/2022 1159 Last data filed at 11/03/2022 1100 Gross per 24 hour  Intake 1395.83 ml  Output 1450 ml  Net -54.17 ml      11/03/2022    4:13 AM 11/01/2022    6:41 PM 06/15/2021    7:52 AM  Last 3 Weights  Weight (lbs) 176 lb 9.4 oz 160 lb 11.5 oz 150 lb  Weight (kg) 80.1 kg 72.9 kg 68.04 kg     Body mass index is 27.66 kg/m.  General:  Well nourished, well developed, in no acute distress HEENT: normal Neck: no JVD Vascular: No carotid bruits; Distal pulses 2+ bilaterally Cardiac:  normal S1, S2; RRR; no murmur Decreased pulses lower extremities Lungs:  clear to auscultation bilaterally, no wheezing,  rhonchi or rales  Abd: soft, nontender, no hepatomegaly  Ext: no edema Musculoskeletal:  No deformities, BUE and BLE strength normal and equal Skin: warm and dry  Neuro:  CNs 2-12 intact, no focal abnormalities noted Psych:  Normal affect   EKG:  The EKG was personally reviewed and demonstrates:   Normal sinus rhythm with PACs rate 67 bpm  Telemetry:  Telemetry was personally reviewed and demonstrates:   Normal sinus rhythm  Relevant CV Studies: Echocardiogram pending  Laboratory Data:  High Sensitivity Troponin:  No results for input(s): "TROPONINIHS" in the last 720 hours.   Chemistry Recent Labs  Lab 11/01/22 1843 11/01/22 2001 11/02/22 0625 11/03/22 0545  NA 136  --  139 139  K 3.5  --  3.4* 3.0*  CL 102  --  104 106  CO2 19*  --  21* 24  GLUCOSE 88  --  84 106*  BUN 14  --  12 11  CREATININE 1.08  --  0.98 1.00  CALCIUM 8.5*  --  7.8* 8.4*  MG  --  2.0  --   --   GFRNONAA >60  --  >60 >60  ANIONGAP 15  --  14 9    Recent Labs  Lab 11/01/22 1843 11/02/22 0625 11/03/22 0545  PROT 7.0 6.6 6.3*  ALBUMIN 4.2 3.7 3.3*  AST 296* 366* 408*  ALT 63* 74* 75*  ALKPHOS 55 57 49  BILITOT 1.3* 1.6* 0.9   Lipids No results for input(s): "CHOL", "TRIG", "HDL", "LABVLDL", "LDLCALC", "CHOLHDL" in the last 168 hours.  Hematology Recent Labs  Lab 11/01/22 1843 11/02/22 0625 11/03/22 0545  WBC 9.3 9.7 10.2  RBC 4.97 4.64 4.33  HGB 14.5 13.5 12.7*  HCT 44.9 41.8 38.3*  MCV 90.3 90.1 88.5  MCH 29.2 29.1 29.3  MCHC 32.3 32.3 33.2  RDW 14.8 14.6 14.6  PLT 156 159 148*   Thyroid No results for input(s): "TSH", "FREET4" in the last 168 hours.  BNPNo results for input(s): "BNP", "PROBNP" in the last 168 hours.  DDimer  Recent Labs  Lab 11/01/22 2001  DDIMER 0.96*  Radiology/Studies:  PERIPHERAL VASCULAR CATHETERIZATION  Result Date: 11/02/2022 See surgical note for result.  MR BRAIN WO CONTRAST  Result Date: 11/02/2022 CLINICAL DATA:  Stroke,  follow-up. EXAM: MRI HEAD WITHOUT CONTRAST TECHNIQUE: Multiplanar, multiecho pulse sequences of the brain and surrounding structures were obtained without intravenous contrast. COMPARISON:  Head CT 11/01/2022. FINDINGS: Brain: Punctate subacute infarct in the right cerebellar hemisphere (image 7 series 5). No acute hemorrhage or significant mass effect. Encephalomalacia in the high left frontal lobe from prior ACA-MCA border zone infarct. No hydrocephalus or extra-axial collection. No mass or midline shift. 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 Skull and upper cervical spine: Normal marrow signal. Sinuses/Orbits: Unremarkable. Other: None. IMPRESSION: 1. Punctate subacute infarct in the right cerebellar hemisphere. No acute hemorrhage or significant mass effect. 2. 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 Electronically Signed   By: Orvan Falconer M.D.   On: 11/02/2022 13:28   CT ANGIO AO+BIFEM W & OR WO CONTRAST  Result Date: 11/02/2022 CLINICAL DATA:  Cold right foot, weak pulse. Left foot cold, no palpable pulse. EXAM: CT ANGIOGRAPHY OF ABDOMINAL AORTA WITH ILIOFEMORAL RUNOFF TECHNIQUE: Multidetector CT imaging of the abdomen, pelvis and lower extremities was performed using the standard protocol during bolus administration of intravenous contrast. Multiplanar CT image reconstructions and MIPs were obtained to evaluate the vascular anatomy. RADIATION DOSE REDUCTION: This exam was performed according to the departmental dose-optimization program which includes automated exposure control, adjustment of the mA and/or kV according to patient size and/or use of iterative reconstruction technique. CONTRAST:  OMNIPAQUE IOHEXOL 350 MG/ML SOLN COMPARISON:  None Available. FINDINGS: VASCULAR Aorta: Extensive atherosclerosis.  No aneurysm. Celiac: Extensive atherosclerosis.  Widely patent. Vessel is diffusely ectatic. SMA: Arises from the celiac artery with extensive calcifications. Mild narrowing within the proximal to mid SMA. Renals: Single renal arteries bilaterally, heavily calcified, suspect moderate left renal artery stenosis. IMA: Patent RIGHT Lower Extremity Inflow: Heavily calcified common, internal and external right iliac artery. Stent seen within the right common carotid artery extending to near the common femoral artery. 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 Common and external iliac arteries patent. Outflow: Heavily calcified disease common femoral artery. There is occlusion of the proximal right SFA. Deep femoral artery heavily disease proximally with likely severe proximal stenosis. Contrast is seen within the vessel presumably related to collaterals. Runoff: Reconstitution of a severely diseased popliteal artery. Severely diseased trifurcation vessels. Dominant runoff appears to be via diseased anterior tibial artery. LEFT Lower Extremity Inflow: Left common iliac, external iliac arteries are occluded. Stent seen within the left external iliac artery. There is brief reconstitution of the left common femoral artery which is severely diseased and stenotic. Outflow: Occlusion of the left superficial femoral artery and deep femoral artery. Runoff: No visible significant reconstitution in the left calf. Veins: No obvious venous abnormality within the limitations of this arterial phase study. Review of the MIP images confirms the above findings. NON-VASCULAR Lower chest: No acute abnormality. Hepatobiliary: Innumerable subcentimeter low-density lesions throughout the liver, likely small cysts. Gallbladder unremarkable. Pancreas: No focal abnormality or ductal dilatation. Spleen: No focal abnormality.  Normal size. Adrenals/Urinary Tract: Adrenal glands normal. Bilateral renal cysts, the largest in the upper pole on the right measuring 2.7 cm. No follow-up imaging  recommended. No hydronephrosis. Urinary bladder grossly unremarkable. Stomach/Bowel: Left colonic diverticulosis. No active diverticulitis. Normal  appendix. Stomach and small bowel decompressed, unremarkable. Lymphatic: No adenopathy Reproductive: Prostate enlargement. Other: No free fluid or free air. Musculoskeletal: No acute bony abnormality. 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 Severely diseased iliofemoral vessels on the right. Prior standing in the right common and external iliac arteries. 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 Dominant runoff in the right lower extremity via a diseased anterior tibial artery. 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 Reconstitution of a severely diseased common femoral artery with some flow in a severely diseased deep femoral artery. Left SFA and popliteal arteries occluded. No significant reconstitution in the left calf. NON-VASCULAR Left colonic diverticulosis. No active diverticulitis. No acute findings in the abdomen or pelvis. Electronically Signed   By: Charlett Nose M.D.   On: 11/02/2022 02:38   US Venous Img Lower Bilateral (DVT)  Result Date: 11/02/2022 CLINICAL DATA:  Bilateral leg pain. EXAM: BILATERAL LOWER EXTREMITY VENOUS DOPPLER ULTRASOUND TECHNIQUE: Gray-scale sonography with graded compression, as well as color Doppler and duplex ultrasound were performed to evaluate the lower extremity deep venous systems from the level of the common femoral vein and including the common femoral, femoral, profunda femoral, popliteal and calf veins including the posterior tibial, peroneal and gastrocnemius veins when visible. The superficial great saphenous vein was also interrogated. Spectral Doppler was utilized to evaluate flow at rest and with distal augmentation maneuvers in the common femoral, femoral and popliteal  veins. COMPARISON:  None Available. FINDINGS: RIGHT LOWER EXTREMITY Common Femoral Vein: No evidence of thrombus. Normal compressibility, respiratory phasicity and response to augmentation. Saphenofemoral Junction: No evidence of thrombus. Normal compressibility and flow on color Doppler imaging. Profunda Femoral Vein: No evidence of thrombus. Normal compressibility and flow on color Doppler imaging. Femoral Vein: No evidence of thrombus. Normal compressibility, respiratory phasicity and response to augmentation. Popliteal Vein: No evidence of thrombus. Normal compressibility, respiratory phasicity and response to augmentation. Calf Veins: No evidence of thrombus. Normal compressibility and flow on color Doppler imaging. Superficial Great Saphenous Vein: No evidence of thrombus. Normal compressibility. Venous Reflux:  None. Other Findings:  None. LEFT LOWER EXTREMITY Common Femoral Vein: No evidence of thrombus. Normal compressibility, respiratory phasicity and response to augmentation. Saphenofemoral Junction: No evidence of thrombus. Normal compressibility and flow on color Doppler imaging. Profunda Femoral Vein: No evidence of thrombus. Normal compressibility and flow on color Doppler imaging. Femoral Vein: Evidence of nonocclusive thrombus with abnormal compressibility, respiratory phasicity and response to augmentation. Popliteal Vein: Evidence of nonocclusive thrombus with abnormal compressibility, respiratory phasicity and response to augmentation. Calf Veins: No evidence of thrombus. Normal compressibility and flow on color Doppler imaging. Superficial Great Saphenous Vein: No evidence of thrombus. Normal compressibility. Venous Reflux:  None. Other Findings:  None. IMPRESSION: 1. 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 2. No evidence of DVT within the RIGHT lower extremity. Electronically Signed   By: Aram Candela M.D.   On: 11/02/2022 00:55   US  Abdomen Limited RUQ (LIVER/GB)  Result Date: 11/02/2022 CLINICAL DATA:  Abnormal liver function test. EXAM: ULTRASOUND ABDOMEN LIMITED RIGHT UPPER QUADRANT COMPARISON:  None Available. FINDINGS: Gallbladder: No gallstones or wall thickening visualized (3.2 mm). No sonographic Murphy sign noted by sonographer. Common bile duct: Diameter: 3.8 mm Liver: Multiple small hepatic cysts are seen. The largest measures approximately 1.0 cm x 0.6 cm x 1.3 cm and is located within the left lobe  of the liver. Diffusely increased echogenicity of the liver parenchyma is noted. Portal vein is patent on color Doppler imaging with normal direction of blood flow towards the liver. Other: Multiple simple cysts are seen within the right kidney. The largest measures approximately 3.4 cm x 2.8 cm x 2.8 cm. A small right pleural effusion is noted. IMPRESSION: 1. Hepatic steatosis with multiple hepatic and right renal cysts. 2. Small right pleural effusion. Electronically Signed   By: Aram Candela M.D.   On: 11/02/2022 00:52   CT HEAD CODE STROKE WO CONTRAST  Result Date: 11/01/2022 CLINICAL DATA:  Code stroke. Altered mental status, confusion, left-sided weakness and numbness EXAM: CT HEAD WITHOUT CONTRAST TECHNIQUE: Contiguous axial images were obtained from the base of the skull through the vertex without intravenous contrast. RADIATION DOSE REDUCTION: This exam was performed according to the departmental dose-optimization program which includes automated exposure control, adjustment of the mA and/or kV according to patient size and/or use of iterative reconstruction technique. COMPARISON:  11/22/2008 FINDINGS: Brain: No evidence of acute infarction, hemorrhage, mass, mass effect, or midline shift. No hydrocephalus or extra-axial collection. 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 Vascular: No hyperdense vessel. Atherosclerotic  calcifications in the intracranial carotid and vertebral arteries. Skull: Negative for fracture or focal lesion. Sinuses/Orbits: No acute finding. Other: The mastoid air cells are well aerated. ASPECTS [facility] Stroke Program Early CT Score) - Ganglionic level infarction (caudate, lentiform nuclei, internal capsule, insula, M1-M3 cortex): 7 - Supraganglionic infarction (M4-M6 cortex): 3 Total score (0-10 with 10 being normal): 10 IMPRESSION: 1. No acute intracranial process. 2. ASPECTS is 10 Code stroke imaging results were communicated on 11/01/2022 at 6:58 pm to provider [facility] via telephone, who verbally acknowledged these results. Electronically Signed   By: Wiliam Ke M.D.   On: 11/01/2022 18:59     Assessment and Plan:   PAD/near femoral artery occlusion bilaterally Rest pain and neurologic changes left lower extremity needs femoral endarterectomies of the common femoral arteries and profunda femoris arteries with external iliac stent placement on the left and kissing stent placements up into the aorta to get him enough blood flow to heal a left above-knee amputation.  Acceptable risk from cardiac perspective. Denies unstable angina symptoms Attending normal sinus rhythm  2.  Arrhythmia PACs noted on EKG, no indication of atrial fibrillation Heparin not needed for atrial fibrillation but will be needed for DVT per vascular  3.  Smoker/COPD We have encouraged her to continue to work on weaning her cigarettes and smoking cessation. She will continue to work on this and does not want any assistance with chantix.   -Inhalers as needed  4.  Alcohol abuse Cessation recommend  5.  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  Cardiomyopathy Ejection fraction estimated 45-50% in 2022 Fixed defect on stress test 2022 Repeat echocardiogram  ordered.  Imaging study should not delay surgery scheduled for tomorrow morning  7.  Essential hypertension Will restart metoprolol succinate 50 daily, amlodipine 10 daily Will hold lisinopril in the setting of recent contrast    Total encounter time more than 80 minutes  Greater than 50% was spent in counseling and coordination of care with the patient   For questions or updates, please contact [facility] Please consult [email or web address] for  contact info under    Signed, Julien Nordmann, MD  11/03/2022 11:59 AM

## 2022-11-03 NOTE — Care Management Important Message (Signed)
Important Message  Patient Details  Name: Corey Olson MRN: [id number] Date of Birth: [birth year]   Medicare Important Message Given:  N/A - LOS <3 / Initial given by admissions     [name] 11/03/2022, 3:38 PM

## 2022-11-03 NOTE — Progress Notes (Signed)
ANTICOAGULATION CONSULT NOTE  Pharmacy Consult for heparin infusion Indication: DVT  No Known Allergies  Patient Measurements: Height: 5\' 7"  (170.2 cm) Weight: 80.1 kg (176 lb 9.4 oz) IBW/kg (Calculated) : 66.1 Heparin Dosing Weight: 72.9 kg  Vital Signs: Temp: 98 F (36.7 C) (06/13 1207) BP: 157/71 (06/13 1207) Pulse Rate: 82 (06/13 1207)  Labs: Recent Labs    11/01/22 1843 11/01/22 2001 11/02/22 0625 11/02/22 1315 11/02/22 2240 11/03/22 0545 11/03/22 1255  HGB 14.5  --  13.5  --   --  12.7*  --   HCT 44.9  --  41.8  --   --  38.3*  --   PLT 156  --  159  --   --  148*  --   APTT 27  --   --   --   --   --   --   LABPROT 13.8  --   --   --   --   --   --   INR 1.0  --   --   --   --   --   --   HEPARINUNFRC  --   --   --    < > 0.98* 0.82* 0.68  CREATININE 1.08  --  0.98  --   --  1.00  --   CKTOTAL  --  14,578*  --   --   --  13,747*  --    < > = values in this interval not displayed.     Estimated Creatinine Clearance: 72.7 mL/min (by C-G formula based on SCr of 1 mg/dL).   Medical History: Past Medical History:  Diagnosis Date   Alcohol use disorder, mild, abuse    COPD (chronic obstructive pulmonary disease) (HCC)    Gout    Hyperlipidemia    Hypertension    Hypothyroid    Peripheral vascular complication    Stroke [facility])     Assessment: Pt is a 67 yo male presenting to ED for fall and c/o left leg pain, found with multiple arterial occlusions in lower extremities.Pt w/ med hx of Eliquis in the past, but stated he has not been taking medication recently.  Date Time HL 6/12 1315  >1.1 6/12 2240 0.98, supratherapeutic 6/13 0545 0.82, supratherapeutic 6/13 1255 0.68, therapeutic x 1  Goal of Therapy:  Heparin level 0.3-0.7 units/ml Monitor platelets by anticoagulation protocol: Yes   Plan:  Continue heparin infusion at 700 units/hr Recheck HL in 6 hr after rate change CBC daily while on heparin  Alyxander Kollmann Rodriguez-Guzman PharmD,  BCPS 11/03/2022 1:58 PM

## 2022-11-04 ENCOUNTER — Inpatient Hospital Stay: Payer: Medicare Other | Admitting: Anesthesiology

## 2022-11-04 ENCOUNTER — Inpatient Hospital Stay: Payer: Medicare Other

## 2022-11-04 ENCOUNTER — Other Ambulatory Visit: Payer: Self-pay

## 2022-11-04 ENCOUNTER — Encounter
Admission: EM | Disposition: A | Discharge status: Skilled Nursing Facility | Payer: Self-pay | Source: Home / Self Care | Attending: Osteopathic Medicine

## 2022-11-04 DIAGNOSIS — Z95828 Presence of other vascular implants and grafts: Secondary | ICD-10-CM | POA: Diagnosis not present

## 2022-11-04 DIAGNOSIS — I745 Embolism and thrombosis of iliac artery: Secondary | ICD-10-CM

## 2022-11-04 DIAGNOSIS — I998 Other disorder of circulatory system: Secondary | ICD-10-CM | POA: Diagnosis not present

## 2022-11-04 DIAGNOSIS — I743 Embolism and thrombosis of arteries of the lower extremities: Secondary | ICD-10-CM | POA: Diagnosis not present

## 2022-11-04 DIAGNOSIS — I70223 Atherosclerosis of native arteries of extremities with rest pain, bilateral legs: Secondary | ICD-10-CM

## 2022-11-04 DIAGNOSIS — Z9889 Other specified postprocedural states: Secondary | ICD-10-CM | POA: Diagnosis not present

## 2022-11-04 HISTORY — PX: INSERTION OF ILIAC STENT: SHX6256

## 2022-11-04 HISTORY — PX: ENDARTERECTOMY FEMORAL: SHX5804

## 2022-11-04 LAB — COMPREHENSIVE METABOLIC PANEL
ALT: 80 U/L — ABNORMAL HIGH (ref 0–44)
AST: 426 U/L — ABNORMAL HIGH (ref 15–41)
Albumin: 3.5 g/dL (ref 3.5–5.0)
Alkaline Phosphatase: 49 U/L (ref 38–126)
Anion gap: 10 (ref 5–15)
BUN: 9 mg/dL (ref 8–23)
CO2: 25 mmol/L (ref 22–32)
Calcium: 8.5 mg/dL — ABNORMAL LOW (ref 8.9–10.3)
Chloride: 103 mmol/L (ref 98–111)
Creatinine, Ser: 0.87 mg/dL (ref 0.61–1.24)
GFR, Estimated: >60 mL/min (ref >60)
Glucose, Bld: 123 mg/dL — ABNORMAL HIGH (ref 70–99)
Potassium: 3 mmol/L — ABNORMAL LOW (ref 3.5–5.1)
Sodium: 138 mmol/L (ref 135–145)
Total Bilirubin: 0.7 mg/dL (ref 0.3–1.2)
Total Protein: 6.7 g/dL (ref 6.5–8.1)

## 2022-11-04 LAB — CBC
HCT: 38.6 % — ABNORMAL LOW (ref 39.0–52.0)
Hemoglobin: 12.7 g/dL — ABNORMAL LOW (ref 13.0–17.0)
MCH: 29 pg (ref 26.0–34.0)
MCHC: 32.9 g/dL (ref 30.0–36.0)
MCV: 88.1 fL (ref 80.0–100.0)
Platelets: 161 10*3/uL (ref 150–400)
RBC: 4.38 MIL/uL (ref 4.22–5.81)
RDW: 14.6 % (ref 11.5–15.5)
WBC: 12.2 10*3/uL — ABNORMAL HIGH (ref 4.0–10.5)
nRBC: 0 % (ref 0.0–0.2)

## 2022-11-04 LAB — LIPID PANEL
Cholesterol: 101 mg/dL (ref 0–200)
HDL: 35 mg/dL — ABNORMAL LOW (ref >40)
LDL Cholesterol: 49 mg/dL (ref 0–99)
Total CHOL/HDL Ratio: 2.9 RATIO
Triglycerides: 83 mg/dL (ref <150)
VLDL: 17 mg/dL (ref 0–40)

## 2022-11-04 LAB — PROTIME-INR
INR: 1.1 (ref 0.8–1.2)
Prothrombin Time: 14.1 seconds (ref 11.4–15.2)

## 2022-11-04 LAB — TYPE AND SCREEN: Antibody Screen: NEGATIVE

## 2022-11-04 LAB — APTT: aPTT: 71 seconds — ABNORMAL HIGH (ref 24–36)

## 2022-11-04 LAB — GLUCOSE, CAPILLARY: Glucose-Capillary: 142 mg/dL — ABNORMAL HIGH (ref 70–99)

## 2022-11-04 LAB — HEPARIN LEVEL (UNFRACTIONATED): Heparin Unfractionated: 0.37 IU/mL (ref 0.30–0.70)

## 2022-11-04 LAB — HEMOGLOBIN A1C
Hgb A1c MFr Bld: 5.3 % (ref 4.8–5.6)
Mean Plasma Glucose: 105.41 mg/dL

## 2022-11-04 SURGERY — ENDARTERECTOMY, FEMORAL
Anesthesia: General | Site: Groin | Laterality: Bilateral

## 2022-11-04 SURGERY — ENDARTERECTOMY, FEMORAL
Anesthesia: General | Laterality: Bilateral

## 2022-11-04 MED ORDER — PHENYLEPHRINE HCL (PRESSORS) 10 MG/ML IV SOLN
INTRAVENOUS | Status: DC | PRN
  Administered 2022-11-04 (×3): 80 ug via INTRAVENOUS

## 2022-11-04 MED ORDER — MIDAZOLAM HCL 2 MG/2ML IJ SOLN
INTRAMUSCULAR | Status: AC
  Filled 2022-11-04: qty 2

## 2022-11-04 MED ORDER — SUCCINYLCHOLINE CHLORIDE 200 MG/10ML IV SOSY: PREFILLED_SYRINGE | INTRAVENOUS | Status: DC | PRN

## 2022-11-04 MED ORDER — HEMOSTATIC AGENTS (NO CHARGE) OPTIME
TOPICAL | Status: DC | PRN
  Administered 2022-11-04: 2 via TOPICAL

## 2022-11-04 MED ORDER — ESMOLOL HCL 100 MG/10ML IV SOLN
INTRAVENOUS | Status: AC
  Filled 2022-11-04: qty 10

## 2022-11-04 MED ORDER — LIDOCAINE HCL (PF) 2 % IJ SOLN
INTRAMUSCULAR | Status: AC
  Filled 2022-11-04: qty 5

## 2022-11-04 MED ORDER — HEPARIN 30,000 UNITS/1000 ML (OHS) CELLSAVER SOLUTION
Status: AC | PRN
  Administered 2022-11-04: 1

## 2022-11-04 MED ORDER — CEFAZOLIN SODIUM-DEXTROSE 2-4 GM/100ML-% IV SOLN
INTRAVENOUS | Status: AC
  Filled 2022-11-04: qty 100

## 2022-11-04 MED ORDER — DEXAMETHASONE SODIUM PHOSPHATE 10 MG/ML IJ SOLN
INTRAMUSCULAR | Status: AC
  Filled 2022-11-04: qty 1

## 2022-11-04 MED ORDER — LACTATED RINGERS IV SOLN: INTRAVENOUS | Status: DC | PRN

## 2022-11-04 MED ORDER — VISTASEAL 10 ML SINGLE DOSE KIT
PACK | CUTANEOUS | Status: AC
  Filled 2022-11-04: qty 10

## 2022-11-04 MED ORDER — HEPARIN 30,000 UNITS/1000 ML (OHS) CELLSAVER SOLUTION
Status: AC
  Filled 2022-11-04: qty 1000

## 2022-11-04 MED ORDER — PHENYLEPHRINE HCL-NACL 20-0.9 MG/250ML-% IV SOLN
INTRAVENOUS | Status: DC | PRN
  Administered 2022-11-04: 40 ug/min via INTRAVENOUS

## 2022-11-04 MED ORDER — PROPOFOL 10 MG/ML IV BOLUS
INTRAVENOUS | Status: DC | PRN
  Administered 2022-11-04: 80 mg via INTRAVENOUS

## 2022-11-04 MED ORDER — ACETAMINOPHEN 10 MG/ML IV SOLN: 1000.0000 mg | Freq: Once | INTRAVENOUS | Status: DC | PRN

## 2022-11-04 MED ORDER — FAMOTIDINE 20 MG PO TABS: 40.0000 mg | ORAL_TABLET | Freq: Once | ORAL | Status: DC | PRN

## 2022-11-04 MED ORDER — SUGAMMADEX SODIUM 200 MG/2ML IV SOLN
INTRAVENOUS | Status: DC | PRN
  Administered 2022-11-04: 142.4 mg via INTRAVENOUS

## 2022-11-04 MED ORDER — ROCURONIUM BROMIDE 10 MG/ML (PF) SYRINGE
PREFILLED_SYRINGE | INTRAVENOUS | Status: AC
  Filled 2022-11-04: qty 10

## 2022-11-04 MED ORDER — CEFAZOLIN SODIUM-DEXTROSE 2-4 GM/100ML-% IV SOLN
2.0000 g | INTRAVENOUS | Status: AC
  Administered 2022-11-04 (×2): 2 g via INTRAVENOUS

## 2022-11-04 MED ORDER — ONDANSETRON HCL 4 MG/2ML IJ SOLN
INTRAMUSCULAR | Status: DC | PRN
  Administered 2022-11-04: 4 mg via INTRAVENOUS

## 2022-11-04 MED ORDER — ROCURONIUM BROMIDE 100 MG/10ML IV SOLN
INTRAVENOUS | Status: DC | PRN
  Administered 2022-11-04 (×2): 20 mg via INTRAVENOUS
  Administered 2022-11-04: 80 mg via INTRAVENOUS
  Administered 2022-11-04 (×2): 20 mg via INTRAVENOUS

## 2022-11-04 MED ORDER — HEPARIN SODIUM (PORCINE) 5000 UNIT/ML IJ SOLN
INTRAMUSCULAR | Status: AC
  Filled 2022-11-04: qty 1

## 2022-11-04 MED ORDER — HEPARIN SODIUM (PORCINE) 1000 UNIT/ML IJ SOLN
INTRAMUSCULAR | Status: AC
  Filled 2022-11-04: qty 10

## 2022-11-04 MED ORDER — VISTASEAL 10 ML SINGLE DOSE KIT
PACK | CUTANEOUS | Status: DC | PRN
  Administered 2022-11-04: 10 mL via TOPICAL

## 2022-11-04 MED ORDER — HYDROMORPHONE HCL 1 MG/ML IJ SOLN
1.0000 mg | Freq: Once | INTRAMUSCULAR | Status: AC | PRN
  Administered 2022-11-05: 1 mg via INTRAVENOUS
  Filled 2022-11-04: qty 1

## 2022-11-04 MED ORDER — FENTANYL CITRATE (PF) 100 MCG/2ML IJ SOLN
INTRAMUSCULAR | Status: DC | PRN
  Administered 2022-11-04 (×2): 50 ug via INTRAVENOUS

## 2022-11-04 MED ORDER — POTASSIUM CHLORIDE 10 MEQ/100ML IV SOLN
10.0000 meq | INTRAVENOUS | Status: AC
  Administered 2022-11-04 (×4): 10 meq via INTRAVENOUS
  Filled 2022-11-04 (×4): qty 100

## 2022-11-04 MED ORDER — OXYCODONE HCL 5 MG/5ML PO SOLN: 5.0000 mg | Freq: Once | ORAL | Status: DC | PRN

## 2022-11-04 MED ORDER — FENTANYL CITRATE (PF) 100 MCG/2ML IJ SOLN
INTRAMUSCULAR | Status: AC
  Filled 2022-11-04: qty 2

## 2022-11-04 MED ORDER — METHYLPREDNISOLONE SODIUM SUCC 125 MG IJ SOLR: 125.0000 mg | Freq: Once | INTRAMUSCULAR | Status: DC | PRN

## 2022-11-04 MED ORDER — EPHEDRINE 5 MG/ML INJ
INTRAVENOUS | Status: AC
  Filled 2022-11-04: qty 5

## 2022-11-04 MED ORDER — ONDANSETRON HCL 4 MG/2ML IJ SOLN
INTRAMUSCULAR | Status: AC
  Filled 2022-11-04: qty 2

## 2022-11-04 MED ORDER — SODIUM CHLORIDE 0.9 % IR SOLN
Status: DC | PRN
  Administered 2022-11-04: 501 mL

## 2022-11-04 MED ORDER — DEXAMETHASONE SODIUM PHOSPHATE 10 MG/ML IJ SOLN
INTRAMUSCULAR | Status: DC | PRN
  Administered 2022-11-04: 4 mg via INTRAVENOUS

## 2022-11-04 MED ORDER — FENTANYL CITRATE PF 50 MCG/ML IJ SOSY
12.5000 ug | PREFILLED_SYRINGE | Freq: Once | INTRAMUSCULAR | Status: AC | PRN
  Administered 2022-11-05: 12.5 ug via INTRAVENOUS
  Filled 2022-11-04: qty 1

## 2022-11-04 MED ORDER — OXYCODONE HCL 5 MG PO TABS: 5.0000 mg | ORAL_TABLET | Freq: Once | ORAL | Status: DC | PRN

## 2022-11-04 MED ORDER — LIDOCAINE HCL (CARDIAC) PF 100 MG/5ML IV SOSY
PREFILLED_SYRINGE | INTRAVENOUS | Status: DC | PRN
  Administered 2022-11-04: 80 mg via INTRAVENOUS

## 2022-11-04 MED ORDER — CHLORHEXIDINE GLUCONATE 4 % EX SOLN
60.0000 mL | Freq: Once | CUTANEOUS | Status: AC
  Administered 2022-11-05: 4 via TOPICAL

## 2022-11-04 MED ORDER — DEXMEDETOMIDINE HCL IN NACL 200 MCG/50ML IV SOLN
INTRAVENOUS | Status: DC | PRN
  Administered 2022-11-04: 8 ug via INTRAVENOUS

## 2022-11-04 MED ORDER — MIDAZOLAM HCL 2 MG/ML PO SYRP: 8.0000 mg | ORAL_SOLUTION | Freq: Once | ORAL | Status: DC | PRN

## 2022-11-04 MED ORDER — ALBUMIN HUMAN 5 % IV SOLN: INTRAVENOUS | Status: DC | PRN

## 2022-11-04 MED ORDER — CEFAZOLIN SODIUM 1 G IJ SOLR
INTRAMUSCULAR | Status: AC
  Filled 2022-11-04: qty 20

## 2022-11-04 MED ORDER — CHLORHEXIDINE GLUCONATE 4 % EX SOLN
60.0000 mL | Freq: Once | CUTANEOUS | Status: AC
  Administered 2022-11-04: 4 via TOPICAL

## 2022-11-04 MED ORDER — STERILE WATER FOR IRRIGATION IR SOLN
Status: DC | PRN
  Administered 2022-11-04: 500 mL

## 2022-11-04 MED ORDER — ALBUMIN HUMAN 5 % IV SOLN
INTRAVENOUS | Status: AC
  Filled 2022-11-04: qty 500

## 2022-11-04 MED ORDER — MIDAZOLAM HCL 2 MG/2ML IJ SOLN
INTRAMUSCULAR | Status: DC | PRN
  Administered 2022-11-04: 2 mg via INTRAVENOUS

## 2022-11-04 MED ORDER — SODIUM CHLORIDE 0.9 % IV SOLN: INTRAVENOUS | Status: DC

## 2022-11-04 MED ORDER — DIPHENHYDRAMINE HCL 50 MG/ML IJ SOLN: 50.0000 mg | Freq: Once | INTRAMUSCULAR | Status: DC | PRN

## 2022-11-04 MED ORDER — FENTANYL CITRATE (PF) 100 MCG/2ML IJ SOLN
25.0000 ug | INTRAMUSCULAR | Status: DC | PRN
  Administered 2022-11-04 (×4): 25 ug via INTRAVENOUS

## 2022-11-04 MED ORDER — PROPOFOL 10 MG/ML IV BOLUS
INTRAVENOUS | Status: AC
  Filled 2022-11-04: qty 40

## 2022-11-04 MED ORDER — ONDANSETRON HCL 4 MG/2ML IJ SOLN: 4.0000 mg | Freq: Once | INTRAMUSCULAR | Status: DC | PRN

## 2022-11-04 MED ORDER — ONDANSETRON HCL 4 MG/2ML IJ SOLN: 4.0000 mg | Freq: Four times a day (QID) | INTRAMUSCULAR | Status: DC | PRN

## 2022-11-04 MED ORDER — HEPARIN SODIUM (PORCINE) 1000 UNIT/ML IJ SOLN
INTRAMUSCULAR | Status: DC | PRN
  Administered 2022-11-04 (×2): 2000 [IU] via INTRAVENOUS
  Administered 2022-11-04: 7000 [IU] via INTRAVENOUS

## 2022-11-04 MED ORDER — NITROGLYCERIN IN D5W 200-5 MCG/ML-% IV SOLN
INTRAVENOUS | Status: AC
  Filled 2022-11-04: qty 250

## 2022-11-04 MED ORDER — EPHEDRINE SULFATE (PRESSORS) 50 MG/ML IJ SOLN
INTRAMUSCULAR | Status: DC | PRN
  Administered 2022-11-04: 5 mg via INTRAVENOUS

## 2022-11-04 SURGICAL SUPPLY — 103 items
ADH SKN CLS APL DERMABOND .7 (GAUZE/BANDAGES/DRESSINGS)
AGENT HMST PWDR BTL CLGN 5GM (Miscellaneous) ×1 IMPLANT
APL PRP STRL LF DISP 70% ISPRP (MISCELLANEOUS) ×2
APPLIER CLIP 11 MED OPEN (CLIP)
APPLIER CLIP 9.375 SM OPEN (CLIP)
APR CLP MED 11 20 MLT OPN (CLIP)
APR CLP SM 9.3 20 MLT OPN (CLIP)
BAG DECANTER FOR FLEXI CONT (MISCELLANEOUS) ×1 IMPLANT
BAG ISL LRG 20X20 DRWSTRG (DRAPES)
BAG ISOLATATION DRAPE 20X20 ST (DRAPES) IMPLANT
BALLN LUTONIX 018 5X60X130 (BALLOONS) ×1
BALLN LUTONIX DCB 4X60X130 (BALLOONS) ×1
BALLN ULTRVRSE 8X60X75C (BALLOONS) ×1
BALLOON LUTONIX 018 5X60X130 (BALLOONS) IMPLANT
BALLOON LUTONIX DCB 4X60X130 (BALLOONS) IMPLANT
BALLOON ULTRVRSE 8X60X75C (BALLOONS) IMPLANT
BLADE SURG 15 STRL LF DISP TIS (BLADE) ×1 IMPLANT
BLADE SURG 15 STRL SS (BLADE) ×1
BLADE SURG SZ11 CARB STEEL (BLADE) ×1 IMPLANT
BOOT SUTURE VASCULAR YLW (MISCELLANEOUS) ×1
BRUSH SCRUB EZ 4% CHG (MISCELLANEOUS) ×1 IMPLANT
CANNULA 5F STIFF (CANNULA) IMPLANT
CATH ANGIO 5F PIGTAIL 65CM (CATHETERS) IMPLANT
CATH BEACON 5 .035 65 KMP TIP (CATHETERS) IMPLANT
CATH EMBOLECTOMY 3X80 (CATHETERS) IMPLANT
CATH FOGERTY 4X80 WAS (CATHETERS) IMPLANT
CATH TEMPO 5F RIM 65CM (CATHETERS) IMPLANT
CHLORAPREP W/TINT 26 (MISCELLANEOUS) ×1 IMPLANT
CLAMP SUTURE YELLOW 5 PAIRS (MISCELLANEOUS) ×1 IMPLANT
CLIP APPLIE 11 MED OPEN (CLIP) IMPLANT
CLIP APPLIE 9.375 SM OPEN (CLIP) IMPLANT
CNTNR URN SCR LID CUP LEK RST (MISCELLANEOUS) IMPLANT
COLLAGEN CELLERATERX 5 GRAM (Miscellaneous) IMPLANT
CONT SPEC 4OZ STRL OR WHT (MISCELLANEOUS) ×1
CUP MEDICINE 2OZ PLAST GRAD ST (MISCELLANEOUS) IMPLANT
DERMABOND ADVANCED .7 DNX12 (GAUZE/BANDAGES/DRESSINGS) IMPLANT
DRAPE C-ARM XRAY 36X54 (DRAPES) IMPLANT
DRAPE INCISE IOBAN 66X45 STRL (DRAPES) ×1 IMPLANT
DRAPE SHEET LG 3/4 BI-LAMINATE (DRAPES) IMPLANT
DRESSING SURGICEL FIBRLLR 1X2 (HEMOSTASIS) ×1 IMPLANT
DRSG OPSITE POSTOP 4X6 (GAUZE/BANDAGES/DRESSINGS) IMPLANT
DRSG SURGICEL FIBRILLAR 1X2 (HEMOSTASIS) ×2
DRSG TELFA 3X8 NADH STRL (GAUZE/BANDAGES/DRESSINGS) IMPLANT
ELECT CAUTERY BLADE 6.4 (BLADE) ×1 IMPLANT
ELECT REM PT RETURN 9FT ADLT (ELECTROSURGICAL) ×2
ELECTRODE REM PT RTRN 9FT ADLT (ELECTROSURGICAL) ×1 IMPLANT
GAUZE 4X4 16PLY ~~LOC~~+RFID DBL (SPONGE) ×1 IMPLANT
GLIDEWIRE ADV .035X260CM (WIRE) IMPLANT
GLOVE BIO SURGEON STRL SZ7 (GLOVE) ×3 IMPLANT
GOWN STRL REUS W/ TWL LRG LVL3 (GOWN DISPOSABLE) ×2 IMPLANT
GOWN STRL REUS W/ TWL XL LVL3 (GOWN DISPOSABLE) ×2 IMPLANT
GOWN STRL REUS W/TWL LRG LVL3 (GOWN DISPOSABLE) ×2
GOWN STRL REUS W/TWL XL LVL3 (GOWN DISPOSABLE) ×2
GRAFT VASC PATCH XENOSURE 1X14 (Vascular Products) IMPLANT
INTRODUCER 7FR 23CM (INTRODUCER) IMPLANT
IV NS 500ML (IV SOLUTION) ×1
IV NS 500ML BAXH (IV SOLUTION) ×1 IMPLANT
KIT ENCORE 26 ADVANTAGE (KITS) IMPLANT
KIT PREVENA INCISION MGT 13 (CANNISTER) IMPLANT
KIT TURNOVER KIT A (KITS) ×1 IMPLANT
LABEL OR SOLS (LABEL) ×1 IMPLANT
LOOP VESSEL MAXI 1X406 RED (MISCELLANEOUS) ×2 IMPLANT
LOOP VESSEL MINI 0.8X406 BLUE (MISCELLANEOUS) ×2 IMPLANT
MANIFOLD NEPTUNE II (INSTRUMENTS) ×1 IMPLANT
NDL SAFETY ECLIP 18X1.5 (MISCELLANEOUS) ×1 IMPLANT
NS IRRIG 500ML POUR BTL (IV SOLUTION) ×1 IMPLANT
PACK ANGIOGRAPHY (CUSTOM PROCEDURE TRAY) IMPLANT
PACK BASIN MAJOR ARMC (MISCELLANEOUS) ×1 IMPLANT
PACK UNIVERSAL (MISCELLANEOUS) ×1 IMPLANT
RETRACTOR TRAXI PANNICULUS (MISCELLANEOUS) IMPLANT
SET WALTER ACTIVATION W/DRAPE (SET/KITS/TRAYS/PACK) ×1 IMPLANT
SHEATH BRITE TIP 5FRX11 (SHEATH) IMPLANT
SHEATH BRITE TIP 7FRX11 (SHEATH) IMPLANT
SPONGE T-LAP 18X18 ~~LOC~~+RFID (SPONGE) ×2 IMPLANT
STAPLER SKIN PROX 35W (STAPLE) ×1 IMPLANT
STENT VIABAHN 8X50X120 (Permanent Stent) IMPLANT
STENT VIABAHN5X120X8X (Permanent Stent) ×2 IMPLANT
SUT MNCRL 4-0 (SUTURE) ×1
SUT MNCRL 4-0 27XMFL (SUTURE) ×1
SUT PROLENE 5 0 RB 1 DA (SUTURE) ×2 IMPLANT
SUT PROLENE 6 0 BV (SUTURE) ×4 IMPLANT
SUT PROLENE 7 0 BV 1 (SUTURE) ×2 IMPLANT
SUT SILK 2 0 (SUTURE) ×2
SUT SILK 2-0 18XBRD TIE 12 (SUTURE) ×1 IMPLANT
SUT SILK 3 0 (SUTURE)
SUT SILK 3-0 18XBRD TIE 12 (SUTURE) ×1 IMPLANT
SUT SILK 4 0 (SUTURE)
SUT SILK 4-0 18XBRD TIE 12 (SUTURE) ×1 IMPLANT
SUT VIC AB 2-0 CT1 27 (SUTURE) ×3
SUT VIC AB 2-0 CT1 TAPERPNT 27 (SUTURE) ×2 IMPLANT
SUT VIC AB 3-0 SH 27 (SUTURE) ×1
SUT VIC AB 3-0 SH 27X BRD (SUTURE) ×1 IMPLANT
SUT VICRYL+ 3-0 36IN CT-1 (SUTURE) ×2 IMPLANT
SUTURE MNCRL 4-0 27XMF (SUTURE) IMPLANT
SYR 20ML LL LF (SYRINGE) ×1 IMPLANT
SYR 5ML LL (SYRINGE) ×1 IMPLANT
SYR TB 1ML LL NO SAFETY (SYRINGE) IMPLANT
TAG SUTURE CLAMP YLW 5PR (MISCELLANEOUS) ×1
TRAP FLUID SMOKE EVACUATOR (MISCELLANEOUS) ×1 IMPLANT
TRAY FOLEY MTR SLVR 16FR STAT (SET/KITS/TRAYS/PACK) ×1 IMPLANT
WATER STERILE IRR 500ML POUR (IV SOLUTION) ×1 IMPLANT
WIRE G V18X300CM (WIRE) IMPLANT
WIRE GUIDERIGHT .035X150 (WIRE) IMPLANT

## 2022-11-04 NOTE — Transfer of Care (Signed)
Immediate Anesthesia Transfer of Care Note  Patient: Corey Olson  Procedure(s) Performed: ENDARTERECTOMY FEMORAL (Bilateral: Groin) APPLICATION OF CELL SAVER (Bilateral: Groin) INSERTION OF ILIAC STENT (Bilateral: Groin)  Patient Location: PACU  Anesthesia Type:General  Level of Consciousness: drowsy  Airway & Oxygen Therapy: Patient Spontanous Breathing and Patient connected to face mask oxygen  Post-op Assessment: Report given to RN and Post -op Vital signs reviewed and stable  Post vital signs: stable  Last Vitals:  Vitals Value Taken Time  BP 127/72 11/04/22 1439  Temp    Pulse 85 11/04/22 1443  Resp 27 11/04/22 1443  SpO2 99 % 11/04/22 1443  Vitals shown include unvalidated device data.  Last Pain:  Vitals:   11/04/22 0646  TempSrc: Temporal  PainSc: 0-No pain         Complications: No notable events documented.

## 2022-11-04 NOTE — Progress Notes (Signed)
ANTICOAGULATION CONSULT NOTE  Pharmacy Consult for Heparin Infusion Indication: DVT  Patient Measurements: Height: 5\' 7"  (170.2 cm) Weight: 71.2 kg (156 lb 15.5 oz) (weight verified by RN as wwll, 1 top sheet, 1 blanket, 1 chux pad, 1 pillow) IBW/kg (Calculated) : 66.1 Heparin Dosing Weight: 72.9 kg  Labs: Recent Labs    11/01/22 1843 11/01/22 2001 11/02/22 0625 11/02/22 1315 11/03/22 0545 11/03/22 1255 11/03/22 1909 11/04/22 0643  HGB 14.5  --  13.5  --  12.7*  --   --  12.7*  HCT 44.9  --  41.8  --  38.3*  --   --  38.6*  PLT 156  --  159  --  148*  --   --  161  APTT 27  --   --   --   --   --   --  71*  LABPROT 13.8  --   --   --   --   --   --  14.1  INR 1.0  --   --   --   --   --   --  1.1  HEPARINUNFRC  --   --   --    < > 0.82* 0.68 0.62 0.37  CREATININE 1.08  --  0.98  --  1.00  --   --  0.87  CKTOTAL  --  14,578*  --   --  13,747*  --   --   --    < > = values in this interval not displayed.    Estimated Creatinine Clearance: 77 mL/min (by C-G formula based on SCr of 0.87 mg/dL).  Medical History: Past Medical History:  Diagnosis Date   Alcohol use disorder, mild, abuse    COPD (chronic obstructive pulmonary disease) (HCC)    Gout    Hyperlipidemia    Hypertension    Hypothyroid    Peripheral vascular complication    Stroke [facility])     Assessment: Pt is a 67 yo male presenting to ED for fall and c/o left leg pain, found with multiple arterial occlusions in lower extremities.Pt w/ med hx of Eliquis in the past, but stated he has not been taking medication recently.  Date Time HL 6/12 1315  >1.1 6/12 2240 0.98, supratherapeutic 6/13 0545 0.82, supratherapeutic 6/13 1255 0.68, therapeutic x 1 6/13 1909 0.62, therapeutic x 2 6/14 0643 0.71, therapeutic  Goal of Therapy:  Heparin level 0.3-0.7 units/ml Monitor platelets by anticoagulation protocol: Yes   Plan:  --Continue heparin infusion at 700 units/hr --Re-assess plan after surgery  6/14 --Recheck HL and CBC tomorrow AM  Venita Seng Rodriguez-Guzman PharmD, BCPS 11/04/2022 8:20 AM

## 2022-11-04 NOTE — Anesthesia Procedure Notes (Signed)
Arterial Line Insertion Start/End6/14/2024 8:49 AM Performed by: Corinda Gubler, MD, anesthesiologist  Patient location: OR. Preanesthetic checklist: patient identified, IV checked, site marked, risks and benefits discussed, surgical consent, monitors and equipment checked, pre-op evaluation, timeout performed and anesthesia consent Patient sedated Left, radial was placed Catheter size: 20 G Hand hygiene performed  and maximum sterile barriers used   Attempts: 1 Procedure performed using ultrasound guided technique. Ultrasound Notes:anatomy identified, needle tip was noted to be adjacent to the nerve/plexus identified and no ultrasound evidence of intravascular and/or intraneural injection Following insertion, dressing applied. Post procedure assessment: normal and unchanged  Patient tolerated the procedure well with no immediate complications.

## 2022-11-04 NOTE — Progress Notes (Signed)
Patient not able to sign consent due to poor mental status which is baseline.  I had a long discussion with his sister Wednesday afternoon and am comfortable with surgical consent based on that. Was seen by anesthesia yesterday but consent not done.  Even though the consent orders were put in yesterday while the sister was present, they were not done by the nursing staff on the floor as requested, and sister unable to be reached this morning resulting in delay of case.

## 2022-11-04 NOTE — Progress Notes (Signed)
PT Cancellation Note  Patient Details Name: Corey Olson MRN: [id number] DOB: 1955-09-15   Cancelled Treatment:    Reason Eval/Treat Not Completed: Patient at procedure or test/unavailable.  Pt in surgery from early AM to this point in time for endarterectomy.  Will re-attempt at later date/time as medically appropriate.   Nolon Bussing, PT, DPT Physical Therapist - [facility]  11/04/22, 1:39 PM

## 2022-11-04 NOTE — Anesthesia Preprocedure Evaluation (Addendum)
Anesthesia Evaluation  Patient identified by MRN, date of birth, ID band Patient confused  General Assessment Comment:  Patient AO x 1 to me (only oriented to self). Did vaguely know he was having procedure on his legs done. Did not know where he was or the year.  Daughter came to bedside to provide consent.  Reviewed: Allergy & Precautions, NPO status , Patient's Chart, lab work & pertinent test results  History of Anesthesia Complications Negative for: history of anesthetic complications  Airway Mallampati: III  TM Distance: >3 FB Neck ROM: Full    Dental  (+) Poor Dentition, Missing, Edentulous Upper   Pulmonary neg sleep apnea, COPD, Current Smoker and Patient abstained from smoking.   Pulmonary exam normal breath sounds clear to auscultation       Cardiovascular Exercise Tolerance: Poor METShypertension, + CAD and + Peripheral Vascular Disease  (-) Past MI (-) dysrhythmias  Rhythm:Regular Rate:Normal - Systolic murmurs TTE 2024:  1. Left ventricular ejection fraction, by estimation, is 50 to 55%. The  left ventricle has low normal function. The left ventricle has no regional  wall motion abnormalities. Left ventricular diastolic parameters are  consistent with Grade I diastolic  dysfunction (impaired relaxation). The average left ventricular global  longitudinal strain is -8.9 %.   2. Right ventricular systolic function is normal. The right ventricular  size is normal.   3. The mitral valve is normal in structure. Mild mitral valve  regurgitation. No evidence of mitral stenosis.   4. The aortic valve is tricuspid. Aortic valve regurgitation is not  visualized. Aortic valve sclerosis is present, with no evidence of aortic  valve stenosis.   5. There is borderline dilatation of the aortic root, measuring 37 mm.   6. The inferior vena cava is normal in size with greater than 50%  respiratory variability, suggesting right  atrial pressure of 3 mmHg.   Per cardiology, patient is optimized at acceptable risk for anesthesia   Neuro/Psych Right sided weakness.  Per neurology, no contraindications to anesthesia at this time. CVA, Residual Symptoms negative neurological ROS  negative psych ROS   GI/Hepatic ,neg GERD  ,,(+)     (-) substance abuse    Endo/Other  neg diabetesHypothyroidism    Renal/GU negative Renal ROS     Musculoskeletal  (+) Arthritis ,    Abdominal   Peds  Hematology   Anesthesia Other Findings Past Medical History: No date: Alcohol use disorder, mild, abuse No date: COPD (chronic obstructive pulmonary disease) (HCC) No date: Gout No date: Hyperlipidemia No date: Hypertension No date: Hypothyroid No date: Peripheral vascular complication No date: Stroke [facility])  Reproductive/Obstetrics                             Anesthesia Physical Anesthesia Plan  ASA: 3  Anesthesia Plan: General   Post-op Pain Management:    Induction: Intravenous  PONV Risk Score and Plan: 2 and Ondansetron, Dexamethasone and Treatment may vary due to age or medical condition  Airway Management Planned: Oral ETT and Video Laryngoscope Planned  Additional Equipment: Arterial line  Intra-op Plan:   Post-operative Plan: Extubation in OR and Possible Post-op intubation/ventilation  Informed Consent: I have reviewed the patients History and Physical, chart, labs and discussed the procedure including the risks, benefits and alternatives for the proposed anesthesia with the patient or authorized representative who has indicated his/her understanding and acceptance.     Dental advisory given and Consent  reviewed with POA  Plan Discussed with: CRNA and Surgeon  Anesthesia Plan Comments: (Discussed risks of anesthesia with patient, and patient's daughter at bedside providing consent, including PONV, sore throat, lip/dental/eye damage. Possible blood transfusion which was  agreed to. Rare risks discussed as well, such as cardiorespiratory and neurological sequelae, and allergic reactions. Discussed possible post op continued intubation. Discussed post induction arterial line. Discussed the role of CRNA in patient's perioperative care. Patient understands.)       Anesthesia Quick Evaluation

## 2022-11-04 NOTE — Progress Notes (Signed)
OT Cancellation Note  Patient Details Name: Corey Olson MRN: [id number] DOB: 03-25-1956   Cancelled Treatment:    Reason Eval/Treat Not Completed: Medical issues which prohibited therapy;Other (comment) (OT orders recieved, pt in operating room for procedure. Will check back and eval as appropriate when able.)  Victorino Dike L. Jorell Agne, OTR/L  11/04/22, 8:53 AM

## 2022-11-04 NOTE — Progress Notes (Addendum)
ANTICOAGULATION CONSULT NOTE  Pharmacy Consult for Heparin Infusion Indication: DVT  Patient Measurements: Height: 5\' 7"  (170.2 cm) Weight: 71.2 kg (156 lb 15.5 oz) (weight verified by RN as wwll, 1 top sheet, 1 blanket, 1 chux pad, 1 pillow) IBW/kg (Calculated) : 66.1 Heparin Dosing Weight: 72.9 kg  Labs: Recent Labs     0000 11/01/22 2001 11/02/22 0625 11/02/22 1315 11/03/22 0545 11/03/22 1255 11/03/22 1909 11/04/22 0643  HGB   < >  --  13.5  --  12.7*  --   --  12.7*  HCT  --   --  41.8  --  38.3*  --   --  38.6*  PLT  --   --  159  --  148*  --   --  161  APTT  --   --   --   --   --   --   --  71*  LABPROT  --   --   --   --   --   --   --  14.1  INR  --   --   --   --   --   --   --  1.1  HEPARINUNFRC  --   --   --    < > 0.82* 0.68 0.62 0.37  CREATININE  --   --  0.98  --  1.00  --   --  0.87  CKTOTAL  --  14,578*  --   --  13,747*  --   --   --    < > = values in this interval not displayed.    Estimated Creatinine Clearance: 77 mL/min (by C-G formula based on SCr of 0.87 mg/dL).  Medical History: Past Medical History:  Diagnosis Date   Alcohol use disorder, mild, abuse    COPD (chronic obstructive pulmonary disease) (HCC)    Gout    Hyperlipidemia    Hypertension    Hypothyroid    Peripheral vascular complication    Stroke [facility])     Assessment: Pt is a 67 yo male presenting to ED for fall and c/o left leg pain, found with multiple arterial occlusions in lower extremities.Pt w/ med hx of Eliquis in the past, but stated he has not been taking medication recently.  Date Time HL 6/12 1315  >1.1 6/12 2240 0.98, supratherapeutic 6/13 0545 0.82, supratherapeutic 6/13 1255 0.68, therapeutic x 1 6/13 1909 0.62, therapeutic x 2 6/14 0643 0.71, therapeutic  Goal of Therapy:  Heparin level 0.3-0.7 units/ml Monitor platelets by anticoagulation protocol: Yes   Plan: s/p artery endarectomies, and angioplasty with stents on 11/04/22 for PAD. Also with DVT. No  intervention planned for DVT.  --Restart heparin infusion at 700 units/hr without bolus --HL in 6 hours from restart of infusion --CBC daily  [name], PharmD, BCPS Clinical Pharmacist  11/04/2022 7:22 PM

## 2022-11-04 NOTE — Interval H&P Note (Signed)
History and Physical Interval Note:  11/04/2022 7:32 AM  Corey Olson  has presented today for surgery, with the diagnosis of PAD with rest pain.  The various methods of treatment have been discussed with the patient and family. After consideration of risks, benefits and other options for treatment, the patient has consented to  Procedure(s): ENDARTERECTOMY FEMORAL (BILATERAL SFA STENTS) (Bilateral) APPLICATION OF CELL SAVER (Bilateral) as a surgical intervention.  The patient's history has been reviewed, patient examined, no change in status, stable for surgery.  I have reviewed the patient's chart and labs.  Questions were answered to the patient's satisfaction.     [name]

## 2022-11-04 NOTE — Op Note (Signed)
OPERATIVE NOTE   PROCEDURE: 1.   Left common femoral, profunda femoris, and superficial femoral artery endarterectomies 2.   Right common femoral, profunda femoris, and superficial femoral artery endarterectomies 3.   4 Fogarty embolectomy balloon in the left iliac arteries for thrombus 4.   3 Fogarty embolectomy balloon in the left profunda femoris artery distally for thrombus 5.   Angioplasty of the left profunda femoris artery distal to the endarterectomy with 4 and 5 mm diameter Lutonix drug-coated angioplasty balloons 6.   Stent placement to the left external iliac artery with 8 mm diameter by 5 cm length Viabahn stent 7.   Stent placement to the right external iliac artery with 8 mm diameter by 5 cm length Viabahn stent   PRE-OPERATIVE DIAGNOSIS: 1.Atherosclerotic occlusive disease bilateral lower extremities with rest pain as well as profound ischemia with fixed neurologic changes on the left lower extremity   POST-OPERATIVE DIAGNOSIS: Same  SURGEON: [name], MD  Assistant:  Rolla Plate, NP  ANESTHESIA:  general  ESTIMATED BLOOD LOSS: 500 cc  FINDING(S): 1.  significant plaque in bilateral common femoral, profunda femoris, and superficial femoral arteries 2.  Acute thrombus in the left iliac arteries as well as the left distal profunda femoris artery treated with Fogarty embolectomy 3.  60 to 70% stenosis of the left external iliac artery between previously placed stents uncovered after Fogarty embolectomy 4.  65 to 70% stenosis of the distal right external iliac artery just below the previously placed stents and just above the femoral endarterectomy site  SPECIMEN(S):  Bilateral common femoral, profunda femoris, and superficial femoral artery plaque. Left iliac and profunda femorus thrombus  INDICATIONS:    Patient presents with rest pain bilaterally and fixed neurologic changes to the left leg.  Bilateral femoral endarterectomies as well as iliac intervention are planned  to try to improve perfusion.  It is understood by he and his family that he may still require left lower extremity amputation but he actually has poor enough blood flow I do not think he would heal a left above-knee amputation at this point without revascularization.  The risks and benefits as well as alternative therapies including intervention were reviewed in detail all questions were answered the patient agrees to proceed with surgery.  DESCRIPTION: After obtaining full informed written consent, the patient was brought back to the operating room and placed supine upon the operating table.  The patient received IV antibiotics prior to induction.  After obtaining adequate anesthesia, the patient was prepped and draped in the standard fashion appropriate time out is called.    I began by dissecting out the femoral arteries on each side. Vertical incisions were created overlying both femoral arteries.  I started on the left side and then did similarly on the right side.  The common femoral artery proximally, and superficial femoral artery, and primary profunda femoris artery branches were encircled with vessel loops and prepared for control.  The profunda femoris artery were dissected out to fourth and fifth order branches on each side as this was the main outflow extremities.  Both SFAs had long segment occlusions.  Both femoral arteries were found to have significant plaque from the common femoral artery into the profunda and superficial femoral arteries.   7000 units of heparin was given and allowed circulate for 5 minutes.  Additional heparin was given as needed later in the procedure.  Attention is then turned to the right femoral artery.  An arteriotomy is made with 11 blade and  extended with Potts scissors in the common femoral artery and carried down onto the first 3-4 cm of the right profunda femorus artery. An endarterectomy was then performed. The [facility] was used to create a plane.  The proximal endpoint was cut flush with tenotomy scissors. This was in the proximal common femoral artery. An eversion endarterectomy was then performed for the first 2-3 cm of the SFA. The distal endpoint of the profunda femorus endarterectomy was created with gentle traction and the distal endpoint was tacked down with three 7-0 prolene sutures.  The bovine pericardial patcth is then selected and prepared for a patch angioplasty.  It is cut and beveled and started at the proximal endpoint with a 6-0 Prolene suture.  Approximately one half of the suture line is run medially and laterally and the distal end point was cut and bevelled to match the arteriotomy.  A second 6-0 Prolene was started at the distal end point and run to the mid portion to complete the arteriotomy.  The vessel was flushed prior to release of control and completion of the anastomosis.  At this point, flow was established first to the profunda femoris artery and then to the superficial femoral artery.   The left femoral artery is then addressed. Arteriotomy is made in the common femoral artery and extended down into the distal profunda femorus artery. There was fairly fresh clot up into the external iliac artery stents and into the common femoral artery and down into the distal profunda femorus artery. Similarly, an endarterectomy was performed with the [facility]. The proximal endpoint was cut flush with tenotomy scissors in the proximal common femoral artery at the bottom of the previously placed stents where there was clot.  This was extended down to the distal common femoral artery where fresh thrombus and plaque were removed.  The first 2 to 3 cm of the superficial femoral artery was addressed and treated with an eversion endarterectomy and this was performed with a hemostat and gentle traction. The arteriotomy was carried down onto the profunda femoris artery beyond the primary branches down to as far as we could dissected out  into the fourth or fifth order branches of the profunda femoris artery artery and the endarterectomy was continued to this point. The distal endpoint was still diseased and there was fresh thrombus in the profunda femoris artery.  I then used a 3 Fogarty embolectomy balloon and made 2 passes in the profunda femoris artery returning a large amount of organized thrombus with return of backbleeding.  Proximally, the thrombus in the iliac arteries was addressed with a 4 Fogarty embolectomy balloon.  3 passes were made with a 4 Fogarty embolectomy balloon with a large amount of organized thrombus removed and brisk inflow seen. The bovine pericardial patch was then brought onto the field.  It is cut and beveled and started at the proximal endpoint with a 6-0 Prolene suture.  Approximately one half of the suture line is run medially and laterally and the distal end point was cut and bevelled to match the arteriotomy.  A second 6-0 Prolene was started at the distal end point and run to the mid portion to complete the arteriotomy.   Flushing maneuvers were performed and flow was reestablished to the femoral vessels.   I then turned my attention to angiography to evaluate the iliac arteries as well as the profunda femoris artery after embolectomy.  The right femoral patch was accessed with a micropuncture needle, micropuncture wire and sheath  were placed.  We upsized to a 5 [place] and then a 7 [place] sheath.  Pigtail catheter was placed in the aorta and an AP aortogram and pelvic obliques were performed.  This demonstrated normal renal arteries.  The aorta and iliac arteries now had flow although there was about a 60 to 70% stenosis in the left external iliac artery between the previously placed stents and about a 65 to 70% stenosis in the distal right external iliac artery just below the previously placed stent just above the recent femoral endarterectomy.  Also on these images, the distal profunda femoris artery still had  residual stenosis and thrombus beyond the endarterectomy site and further out then we could likely get to surgically.  This was in the main artery with several side branches proximal to this having reasonably good flow.  I then crossed the aortic bifurcation with the advantage wire and a rim catheter and then crossed the left external iliac artery stenosis and left profunda femoris artery stenosis.  The profunda femoris artery was addressed with a 4 mm diameter and a 5 mm diameter Lutonix drug-coated angioplasty balloon.  Each inflation was 8 to 10 atm for 1 minute.  Completion imaging showed only about a 20% residual stenosis in the profunda femoris artery after angioplasty.  I then turned my attention to the left external iliac artery.  To bridge between the previously placed stents and address the stenosis in this area, an 8 mm diameter by 5 cm length Viabahn stent was deployed over a V18 wire.  This was postdilated with an 8 mm balloon with excellent angiographic completion result and less than 10% residual stenosis.  The sheath was then pulled back to the right common femoral artery.  The right external iliac artery stenosis was addressed with an 8 mm diameter by 5 cm length Viabahn stent which was postdilated with a 8 mm diameter angioplasty balloon with less than 10% residual stenosis.  The sheath was then removed with a 5-0 Prolene pursestring suture placed on removal.  6-0 Prolene patch sutures including several pledgeted patch sutures were used for hemostasis and hemostasis was achieved.  FIbrillar and Vistacel topical hemostatic agents were placed in the femoral incisions and hemostasis was complete. The femoral incisions were then closed in a layered fashion with 2 layers of 2-0 Vicryl, 2 layers of 3-0 Vicryl, and staple with an incisional (disposable) VAC for the skin closure. Celerate was also placed to help augment wound closure.  The patient was then awakened from anesthesia and taken to the  recovery room in stable condition having tolerated the procedure well.  COMPLICATIONS: None  CONDITION: Stable     [name] 11/04/2022 2:59 PM  This note was created with Dragon Medical transcription system. Any errors in dictation are purely unintentional.

## 2022-11-04 NOTE — Anesthesia Procedure Notes (Signed)
Procedure Name: Intubation Date/Time: 11/04/2022 8:45 AM  Performed by: Emeterio Reeve, CRNAPre-anesthesia Checklist: Patient identified, Emergency Drugs available, Suction available and Patient being monitored Patient Re-evaluated:Patient Re-evaluated prior to induction Oxygen Delivery Method: Circle system utilized Preoxygenation: Pre-oxygenation with 100% oxygen Induction Type: IV induction Ventilation: Mask ventilation without difficulty Laryngoscope Size: Mac and 4 Grade View: Grade II Tube type: Oral Tube size: 7.5 mm Number of attempts: 1 Airway Equipment and Method: Stylet and Oral airway Placement Confirmation: ETT inserted through vocal cords under direct vision, positive ETCO2 and breath sounds checked- equal and bilateral Secured at: 23 cm Tube secured with: Tape Dental Injury: Teeth and Oropharynx as per pre-operative assessment  Comments: Cords clear; extremely poor dentition. CA

## 2022-11-04 NOTE — Progress Notes (Signed)
PROGRESS NOTE    Corey Olson   ZOX:[id number] DOB: [birth year]  DOA: 11/01/2022 Date of Service: 11/04/22 PCP: [name], MD     Brief Narrative / Hospital Course:  Corey Olson is a 67 y.o. male with a history of stroke, peripheral atrial disease who presents with altered mental status, left leg weakness.  Family spoke to him at 430 and felt that he was confused.  Prior to that he had not been spoken to since the night before when he had seemed normal.  Patient reports left leg weakness dragging his left foot while walking and decreased sensation, all of which were new but without clear timelines. 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  06/11: in ED, VSS, code stroke called Per neurology - recs MRI brain, no TPN. Admitted to hospitalist service. Elevated AST/ALT c/w EtOH but EtOH levels <10 and RUQ [place] (+)hepatic steatosis, , Lactic acid ok at 1.3, CBC no concerns, elevated D-Dimer, CK >14,000 and UA no UTI but (+)Hgb no significant RBC c/w rhabdo, EtOH <10, UDS neg. [place] bilateral LE (+)DVT L femoral and popliteal V.  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 Found to have weakneed pulses LLE and CTA runoff performed showing ischemia/severe PAD, on exam LLE is cold below mid-tibia so vascular consulted, continue heparin gtt, taken to cath lab for RLE and LLE angiogram. Significant vascular disease, limb likely not salvageable per Dr Wyn Quaker - prognosis poor for even AKA healing, will likely need extensive endarterectomy/stenting to even salvage an AKA. Can plan for surgery later this week, will need cardiac clearance  06/13: anticipate surgery tomorrow  06/14: endarterectomy/stents bilateral femoral    Consultants:  Neurology Vascular surgery  Cardiology   Procedures: Bilateral LE angiogram 11/02/22 w/ Dr Wyn Quaker  endarterectomy/stents bilateral  femoral 11/04/22 w/ Dr Wyn Quaker      ASSESSMENT & PLAN:   Principal Problem:   Fall Active Problems:   Fall at home, initial encounter   Tobacco abuse   PAD (peripheral artery disease) (HCC)   Essential hypertension   Alcohol abuse   RUE weakness   Non-traumatic rhabdomyolysis   Positive D dimer   Abnormal EKG   LLE w/ severe PAD, ischemic L foot w/ neurological deficits likely cause for fall S/p angiography  Vascular surgery following, pt is s/p surgical intervention w/ endarterectomy/stenting to optimize for AKA   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 HTN HLD Previous Lexiscan w/o ischemia Per cardiology note 02/2021 "high risk, if he develops chest pain with exertion or dyspnea, will schedule left heart cath." Continuing on heparin infusion per vascular team  We were holding statin given transaminitis but risk of holding may be greater than risk to liver, continue statin and will monitor LFT's closely  Weakness and fall Confusion / AMS question toxic metabolic encephalopathy or CVA Likely multifactorial Pressing concern for ischemic LLE as above Concern for CVA w/ MRI brain pending Potential EtOH withdrawal  Treat multiple underlying issues  PT/OT to eval when more stable  Fall precaution   DVT L femoral and popliteal V  Heparin gtt Vascular surgery following but no intervention needed at this time for the DVT    Rhabdomyolysis w/ normal renal function  IV fluids Follow renal function and CK   History CVA w/ residual R sided deficits (+)subacute R cerebellar punctate infarct on MRI  ASA We were holding statin given transaminitis but risk of holding may be greater than risk to liver, will monitor LFT's closely Neurology following  On heparin gtt for DVT and ischemic leg  Transaminitis  Hepatic Steatosis on RUQ [place] EtOH use but no acute intoxication based on blood levels - question alcoholic hepatitis  Follow CMP Hepatitis panel pending  We were  holding statin given transaminitis but risk of holding may be greater than risk to liver, will monitor LFT's closely  EtOH use but no acute intoxication based on blood levels  CIWA protocol  Thiamine  Hypokalemia Replace as needed Monitor BMP  Essential HTN Permissive HTN in light of CVA - can begin to lower BP goal   Tobacco abuse Nicotine patch     DVT: treating w/ heparin  Pertinent IV fluids/nutrition: NS 100 mL/h Central lines / invasive devices: none  Code Status: FULL CODE ACP documentation reviewed: 11/02/22 none on file   Current Admission Status: inpatient   TOC needs / Dispo plan: TBD expect will need SNF Barriers to discharge / significant pending items: vascular surgery for ischemic LLE              Subjective / Brief ROS:  Patient examined resting in PACU Alert, no concerns    Family Communication: none at this time    Objective Findings:  Vitals:   11/04/22 1515 11/04/22 1530 11/04/22 1545 11/04/22 1600  BP: (!) 140/75 (!) 156/60 (!) 156/71 (!) 157/58  Pulse: 95 84 84 93  Resp: 18 20 18 16   Temp:      TempSrc:      SpO2: 97% 97% 98% 97%  Weight:      Height:        Intake/Output Summary (Last 24 hours) at 11/04/2022 1611 Last data filed at 11/04/2022 1406 Gross per 24 hour  Intake 2800 ml  Output 3400 ml  Net -600 ml    Filed Weights   11/01/22 1841 11/03/22 0413 11/04/22 0328  Weight: 72.9 kg 80.1 kg 71.2 kg    Examination:  Physical Exam Constitutional:      General: He is not in acute distress. Cardiovascular:     Rate and Rhythm: Normal rate and regular rhythm.  Pulmonary:     Effort: Pulmonary effort is normal. No respiratory distress.     Breath sounds: No rhonchi.  Abdominal:     General: There is no distension.     Palpations: Abdomen is soft.  Skin:    Comments: Cool to touch LLE distal to mid-tibia   Neurological:     Mental Status: He is alert.  Psychiatric:        Behavior: Behavior normal.           Scheduled Medications:   [MAR Hold] amLODipine  10 mg Oral Daily   [MAR Hold] aspirin EC  81 mg Oral Daily   [MAR Hold] atorvastatin  80 mg Oral QPM   [START ON 11/05/2022] chlorhexidine  60 mL Topical Once   [MAR Hold] metoprolol succinate  50 mg Oral Daily   [MAR Hold] nicotine  21 mg Transdermal Daily   [MAR Hold] pantoprazole  40 mg Oral BID   [MAR Hold] sodium chloride flush  3 mL Intravenous Q12H   [MAR Hold] thiamine  100 mg Oral Daily    Continuous Infusions:  sodium chloride 100 mL/hr at 11/04/22 0832   sodium chloride     acetaminophen     heparin Stopped (11/04/22 0641)  PRN Medications:  acetaminophen, [MAR Hold] acetaminophen **OR** [MAR Hold] acetaminophen, diphenhydrAMINE, famotidine, fentaNYL (SUBLIMAZE) injection, fentaNYL (SUBLIMAZE) injection, HYDROmorphone (DILAUDID) injection, methylPREDNISolone (SOLU-MEDROL) injection, midazolam, ondansetron (ZOFRAN) IV, ondansetron (ZOFRAN) IV, oxyCODONE **OR** oxyCODONE  Antimicrobials from admission:  Anti-infectives (From admission, onward)    Start     Dose/Rate Route Frequency Ordered Stop   11/04/22 0634  ceFAZolin (ANCEF) IVPB 2g/100 mL premix        2 g 200 mL/hr over 30 Minutes Intravenous 30 min pre-op 11/04/22 0634 11/04/22 1251   11/02/22 1431  ceFAZolin (ANCEF) IVPB 2g/100 mL premix        2 g 200 mL/hr over 30 Minutes Intravenous 30 min pre-op 11/02/22 1431 11/02/22 1653           Data Reviewed:  I have personally reviewed the following...  CBC: Recent Labs  Lab 11/01/22 1843 11/02/22 0625 11/03/22 0545 11/04/22 0643  WBC 9.3 9.7 10.2 12.2*  NEUTROABS 7.5  --   --   --   HGB 14.5 13.5 12.7* 12.7*  HCT 44.9 41.8 38.3* 38.6*  MCV 90.3 90.1 88.5 88.1  PLT 156 159 148* 161    Basic Metabolic Panel: Recent Labs  Lab 11/01/22 1843 11/01/22 2001 11/02/22 0625 11/03/22 0545 11/04/22 0643  NA 136  --  139 139 138  K 3.5  --  3.4* 3.0* 3.0*  CL 102  --  104 106 103  CO2  19*  --  21* 24 25  GLUCOSE 88  --  84 106* 123*  BUN 14  --  12 11 9   CREATININE 1.08  --  0.98 1.00 0.87  CALCIUM 8.5*  --  7.8* 8.4* 8.5*  MG  --  2.0  --   --   --     GFR: Estimated Creatinine Clearance: 77 mL/min (by C-G formula based on SCr of 0.87 mg/dL). Liver Function Tests: Recent Labs  Lab 11/01/22 1843 11/02/22 0625 11/03/22 0545 11/04/22 0643  AST 296* 366* 408* 426*  ALT 63* 74* 75* 80*  ALKPHOS 55 57 49 49  BILITOT 1.3* 1.6* 0.9 0.7  PROT 7.0 6.6 6.3* 6.7  ALBUMIN 4.2 3.7 3.3* 3.5    Recent Labs  Lab 11/01/22 2001  LIPASE 27    No results for input(s): "AMMONIA" in the last 168 hours. Coagulation Profile: Recent Labs  Lab 11/01/22 1843 11/04/22 0643  INR 1.0 1.1    Cardiac Enzymes: Recent Labs  Lab 11/01/22 2001 11/03/22 0545  CKTOTAL 14,578* 13,747*    BNP (last 3 results) No results for input(s): "PROBNP" in the last 8760 hours. HbA1C: Recent Labs    11/04/22 0643  HGBA1C 5.3   CBG: Recent Labs  Lab 11/01/22 1842 11/02/22 0215  GLUCAP 91 84    Lipid Profile: Recent Labs    11/04/22 0643  CHOL 101  HDL 35*  LDLCALC 49  TRIG 83  CHOLHDL 2.9   Thyroid Function Tests: No results for input(s): "TSH", "T4TOTAL", "FREET4", "T3FREE", "THYROIDAB" in the last 72 hours. Anemia Panel: No results for input(s): "VITAMINB12", "FOLATE", "FERRITIN", "TIBC", "IRON", "RETICCTPCT" in the last 72 hours. Most Recent Urinalysis On File:     Component Value Date/Time   COLORURINE YELLOW (A) 11/01/2022 2011   APPEARANCEUR CLEAR (A) 11/01/2022 2011   LABSPEC 1.023 11/01/2022 2011   PHURINE 5.0 11/01/2022 2011   GLUCOSEU NEGATIVE 11/01/2022 2011   HGBUR LARGE (A) 11/01/2022 2011   BILIRUBINUR NEGATIVE 11/01/2022 2011   KETONESUR 80 (A) 11/01/2022  2011   PROTEINUR 100 (A) 11/01/2022 2011   NITRITE NEGATIVE 11/01/2022 2011   LEUKOCYTESUR NEGATIVE 11/01/2022 2011   Sepsis  Labs: @LABRCNTIP (procalcitonin:4,lacticidven:4) Microbiology: No results found for this or any previous visit (from the past 240 hour(s)).    Radiology Studies last 3 days: DG C-Arm 1-60 Min-No Report  Result Date: 11/04/2022 Fluoroscopy was utilized by the requesting physician.  No radiographic interpretation.   ECHOCARDIOGRAM COMPLETE  Result Date: 11/03/2022    ECHOCARDIOGRAM REPORT   Patient Name:   Corey Olson Date of Exam: 11/03/2022 Medical Rec #:  [id number]    Height:       67.0 in Accession #:    [id number]   Weight:       176.6 lb Date of Birth:  04-19-1956     BSA:          1.918 m Patient Age:    67 years     BP:           157/70 mmHg Patient Gender: M            HR:           97 bpm. Exam Location:  [facility] Procedure: 2D Echo, Cardiac Doppler, Color Doppler, Strain Analysis and 3D Echo Indications:     CAD  History:         Patient has prior history of Echocardiogram examinations, most                  recent 07/09/2020. CAD, Abnormal ECG, Stroke, PAD and COPD; Risk                  Factors:Hypertension, Dyslipidemia and Current Smoker. ETOH                  abuse.  Sonographer:     Mikki Harbor Referring Phys:  [id number] Antonieta Iba Diagnosing Phys: Julien Nordmann MD  Sonographer Comments: Global longitudinal strain was attempted. IMPRESSIONS  1. Left ventricular ejection fraction, by estimation, is 50 to 55%. The left ventricle has low normal function. The left ventricle has no regional wall motion abnormalities. Left ventricular diastolic parameters are consistent with Grade I diastolic dysfunction (impaired relaxation). The average left ventricular global longitudinal strain is -8.9 %.  2. Right ventricular systolic function is normal. The right ventricular size is normal.  3. The mitral valve is normal in structure. Mild mitral valve regurgitation. No evidence of mitral stenosis.  4. The aortic valve is tricuspid. Aortic valve regurgitation is not visualized. Aortic valve sclerosis is  present, with no evidence of aortic valve stenosis.  5. There is borderline dilatation of the aortic root, measuring 37 mm.  6. The inferior vena cava is normal in size with greater than 50% respiratory variability, suggesting right atrial pressure of 3 mmHg. FINDINGS  Left Ventricle: Left ventricular ejection fraction, by estimation, is 50 to 55%. The left ventricle has low normal function. The left ventricle has no regional wall motion abnormalities. The average left ventricular global longitudinal strain is -8.9 %.  The left ventricular internal cavity size was normal in size. There is no left ventricular hypertrophy. Left ventricular diastolic parameters are consistent with Grade I diastolic dysfunction (impaired relaxation). Right Ventricle: The right ventricular size is normal. No increase in right ventricular wall thickness. Right ventricular systolic function is normal. Left Atrium: Left atrial size was normal in size. Right Atrium: Right atrial size was normal in size. Pericardium: There is no evidence of pericardial effusion. Mitral  Valve: The mitral valve is normal in structure. Mild mitral valve regurgitation. No evidence of mitral valve stenosis. MV peak gradient, 6.9 mmHg. The mean mitral valve gradient is 2.0 mmHg. Tricuspid Valve: The tricuspid valve is normal in structure. Tricuspid valve regurgitation is mild . No evidence of tricuspid stenosis. Aortic Valve: The aortic valve is tricuspid. Aortic valve regurgitation is not visualized. Aortic valve sclerosis is present, with no evidence of aortic valve stenosis. Aortic valve mean gradient measures 3.0 mmHg. Aortic valve peak gradient measures 7.0  mmHg. Aortic valve area, by VTI measures 2.55 cm. Pulmonic Valve: The pulmonic valve was normal in structure. Pulmonic valve regurgitation is not visualized. No evidence of pulmonic stenosis. Aorta: The aortic root is normal in size and structure. There is borderline dilatation of the aortic root,  measuring 37 mm. Venous: The inferior vena cava is normal in size with greater than 50% respiratory variability, suggesting right atrial pressure of 3 mmHg. IAS/Shunts: No atrial level shunt detected by color flow Doppler.  LEFT VENTRICLE PLAX 2D LVIDd:         4.80 cm      Diastology LVIDs:         3.70 cm      LV e' medial:    4.68 cm/s LV PW:         1.50 cm      LV E/e' medial:  10.3 LV IVS:        1.20 cm      LV e' lateral:   5.22 cm/s LVOT diam:     2.10 cm      LV E/e' lateral: 9.2 LV SV:         66 LV SV Index:   34           2D Longitudinal Strain LVOT Area:     3.46 cm     2D Strain GLS Avg:     -8.9 %  LV Volumes (MOD) LV vol d, MOD A2C: 104.0 ml LV vol d, MOD A4C: 75.5 ml LV vol s, MOD A2C: 60.1 ml LV vol s, MOD A4C: 47.3 ml LV SV MOD A2C:     43.9 ml LV SV MOD A4C:     75.5 ml LV SV MOD BP:      38.4 ml RIGHT VENTRICLE RV Basal diam:  3.50 cm RV Mid diam:    3.50 cm RV S prime:     15.90 cm/s TAPSE (M-mode): 3.2 cm LEFT ATRIUM             Index        RIGHT ATRIUM           Index LA diam:        3.90 cm 2.03 cm/m   RA Area:     17.70 cm LA Vol (A2C):   67.7 ml 35.30 ml/m  RA Volume:   48.10 ml  25.08 ml/m LA Vol (A4C):   32.5 ml 16.94 ml/m LA Biplane Vol: 48.7 ml 25.39 ml/m  AORTIC VALVE                    PULMONIC VALVE AV Area (Vmax):    2.60 cm     PV Vmax:       0.98 m/s AV Area (Vmean):   2.54 cm     PV Peak grad:  3.9 mmHg AV Area (VTI):     2.55 cm AV Vmax:  132.00 cm/s AV Vmean:          84.700 cm/s AV VTI:            0.258 m AV Peak Grad:      7.0 mmHg AV Mean Grad:      3.0 mmHg LVOT Vmax:         99.00 cm/s LVOT Vmean:        62.200 cm/s LVOT VTI:          0.190 m LVOT/AV VTI ratio: 0.74  AORTA Ao Root diam: 3.70 cm MITRAL VALVE MV Area (PHT): 4.80 cm     SHUNTS MV Area VTI:   2.32 cm     Systemic VTI:  0.19 m MV Peak grad:  6.9 mmHg     Systemic Diam: 2.10 cm MV Mean grad:  2.0 mmHg MV Vmax:       1.31 m/s MV Vmean:      64.4 cm/s MV Decel Time: 158 msec MV E velocity:  48.20 cm/s MV A velocity: 121.00 cm/s MV E/A ratio:  0.40 Julien Nordmann MD Electronically signed by Julien Nordmann MD Signature Date/Time: 11/03/2022/4:07:01 PM    Final    PERIPHERAL VASCULAR CATHETERIZATION  Result Date: 11/02/2022 See surgical note for result.  MR BRAIN WO CONTRAST  Result Date: 11/02/2022 CLINICAL DATA:  Stroke, follow-up. EXAM: MRI HEAD WITHOUT CONTRAST TECHNIQUE: Multiplanar, multiecho pulse sequences of the brain and surrounding structures were obtained without intravenous contrast. COMPARISON:  Head CT 11/01/2022. FINDINGS: Brain: Punctate subacute infarct in the right cerebellar hemisphere (image 7 series 5). No acute hemorrhage or significant mass effect. Encephalomalacia in the high left frontal lobe from prior ACA-MCA border zone infarct. No hydrocephalus or extra-axial collection. No mass or midline shift. 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 Skull and upper cervical spine: Normal marrow signal. Sinuses/Orbits: Unremarkable. Other: None. IMPRESSION: 1. Punctate subacute infarct in the right cerebellar hemisphere. No acute hemorrhage or significant mass effect. 2. 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 Electronically Signed   By: Orvan Falconer M.D.   On: 11/02/2022 13:28   CT ANGIO AO+BIFEM W & OR WO CONTRAST  Result Date: 11/02/2022 CLINICAL DATA:  Cold right foot, weak pulse. Left foot cold, no palpable pulse. EXAM: CT ANGIOGRAPHY OF ABDOMINAL AORTA WITH ILIOFEMORAL RUNOFF TECHNIQUE: Multidetector CT imaging of the abdomen, pelvis and lower extremities was performed using the standard protocol during bolus administration of intravenous contrast. Multiplanar CT image reconstructions and MIPs were obtained to evaluate the vascular anatomy. RADIATION DOSE REDUCTION: This exam was performed according to the departmental  dose-optimization program which includes automated exposure control, adjustment of the mA and/or kV according to patient size and/or use of iterative reconstruction technique. CONTRAST:  OMNIPAQUE IOHEXOL 350 MG/ML SOLN COMPARISON:  None Available. FINDINGS: VASCULAR Aorta: Extensive atherosclerosis.  No aneurysm. Celiac: Extensive atherosclerosis. Widely patent. Vessel is diffusely ectatic. SMA: Arises from the celiac artery with extensive calcifications. Mild narrowing within the proximal to mid SMA. Renals: Single renal arteries bilaterally, heavily calcified, suspect moderate left renal artery stenosis. IMA: Patent RIGHT Lower Extremity Inflow: Heavily calcified common, internal and external right iliac artery. Stent seen within the right common carotid artery extending to near the common femoral artery. 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 Common and external iliac arteries patent. Outflow: Heavily calcified disease common femoral artery.  There is occlusion of the proximal right SFA. Deep femoral artery heavily disease proximally with likely severe proximal stenosis. Contrast is seen within the vessel presumably related to collaterals. Runoff: Reconstitution of a severely diseased popliteal artery. Severely diseased trifurcation vessels. Dominant runoff appears to be via diseased anterior tibial artery. LEFT Lower Extremity Inflow: Left common iliac, external iliac arteries are occluded. Stent seen within the left external iliac artery. There is brief reconstitution of the left common femoral artery which is severely diseased and stenotic. Outflow: Occlusion of the left superficial femoral artery and deep femoral artery. Runoff: No visible significant reconstitution in the left calf. Veins: No obvious venous abnormality within the limitations of this arterial phase study. Review of the MIP images confirms the above findings. NON-VASCULAR Lower chest: No acute abnormality. Hepatobiliary: Innumerable  subcentimeter low-density lesions throughout the liver, likely small cysts. Gallbladder unremarkable. Pancreas: No focal abnormality or ductal dilatation. Spleen: No focal abnormality.  Normal size. Adrenals/Urinary Tract: Adrenal glands normal. Bilateral renal cysts, the largest in the upper pole on the right measuring 2.7 cm. No follow-up imaging recommended. No hydronephrosis. Urinary bladder grossly unremarkable. Stomach/Bowel: Left colonic diverticulosis. No active diverticulitis. Normal appendix. Stomach and small bowel decompressed, unremarkable. Lymphatic: No adenopathy Reproductive: Prostate enlargement. Other: No free fluid or free air. Musculoskeletal: No acute bony abnormality. 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 Severely diseased iliofemoral vessels on the right. Prior standing in the right common and external iliac arteries. 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 Dominant runoff in the right lower extremity via a diseased anterior tibial artery. 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 Reconstitution of a severely diseased common femoral artery with some flow in a severely diseased deep femoral artery. Left SFA and popliteal arteries occluded. No significant reconstitution in the left calf. NON-VASCULAR Left colonic diverticulosis. No active diverticulitis. No acute findings in the abdomen or pelvis. Electronically Signed   By: Charlett Nose M.D.   On: 11/02/2022 02:38   US Venous Img Lower Bilateral (DVT)  Result Date: 11/02/2022 CLINICAL DATA:  Bilateral leg pain. EXAM: BILATERAL LOWER EXTREMITY VENOUS DOPPLER ULTRASOUND TECHNIQUE: Gray-scale sonography with graded compression, as well as color Doppler and duplex ultrasound were performed to evaluate the lower extremity deep venous systems from the level of the common femoral vein and including the  common femoral, femoral, profunda femoral, popliteal and calf veins including the posterior tibial, peroneal and gastrocnemius veins when visible. The superficial great saphenous vein was also interrogated. Spectral Doppler was utilized to evaluate flow at rest and with distal augmentation maneuvers in the common femoral, femoral and popliteal veins. COMPARISON:  None Available. FINDINGS: RIGHT LOWER EXTREMITY Common Femoral Vein: No evidence of thrombus. Normal compressibility, respiratory phasicity and response to augmentation. Saphenofemoral Junction: No evidence of thrombus. Normal compressibility and flow on color Doppler imaging. Profunda Femoral Vein: No evidence of thrombus. Normal compressibility and flow on color Doppler imaging. Femoral Vein: No evidence of thrombus. Normal compressibility, respiratory phasicity and response to augmentation. Popliteal Vein: No evidence of thrombus. Normal compressibility, respiratory phasicity and response to augmentation. Calf Veins: No evidence of thrombus. Normal compressibility and flow on color Doppler imaging. Superficial Great Saphenous Vein: No evidence of thrombus. Normal compressibility. Venous Reflux:  None. Other Findings:  None. LEFT LOWER EXTREMITY Common Femoral Vein: No evidence of thrombus. Normal compressibility, respiratory phasicity and response to augmentation. Saphenofemoral Junction: No evidence of thrombus. Normal  compressibility and flow on color Doppler imaging. Profunda Femoral Vein: No evidence of thrombus. Normal compressibility and flow on color Doppler imaging. Femoral Vein: Evidence of nonocclusive thrombus with abnormal compressibility, respiratory phasicity and response to augmentation. Popliteal Vein: Evidence of nonocclusive thrombus with abnormal compressibility, respiratory phasicity and response to augmentation. Calf Veins: No evidence of thrombus. Normal compressibility and flow on color Doppler imaging. Superficial Great Saphenous  Vein: No evidence of thrombus. Normal compressibility. Venous Reflux:  None. Other Findings:  None. IMPRESSION: 1. 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 2. No evidence of DVT within the RIGHT lower extremity. Electronically Signed   By: Aram Candela M.D.   On: 11/02/2022 00:55   US Abdomen Limited RUQ (LIVER/GB)  Result Date: 11/02/2022 CLINICAL DATA:  Abnormal liver function test. EXAM: ULTRASOUND ABDOMEN LIMITED RIGHT UPPER QUADRANT COMPARISON:  None Available. FINDINGS: Gallbladder: No gallstones or wall thickening visualized (3.2 mm). No sonographic Murphy sign noted by sonographer. Common bile duct: Diameter: 3.8 mm Liver: Multiple small hepatic cysts are seen. The largest measures approximately 1.0 cm x 0.6 cm x 1.3 cm and is located within the left lobe of the liver. Diffusely increased echogenicity of the liver parenchyma is noted. Portal vein is patent on color Doppler imaging with normal direction of blood flow towards the liver. Other: Multiple simple cysts are seen within the right kidney. The largest measures approximately 3.4 cm x 2.8 cm x 2.8 cm. A small right pleural effusion is noted. IMPRESSION: 1. Hepatic steatosis with multiple hepatic and right renal cysts. 2. Small right pleural effusion. Electronically Signed   By: Aram Candela M.D.   On: 11/02/2022 00:52   CT HEAD CODE STROKE WO CONTRAST  Result Date: 11/01/2022 CLINICAL DATA:  Code stroke. Altered mental status, confusion, left-sided weakness and numbness EXAM: CT HEAD WITHOUT CONTRAST TECHNIQUE: Contiguous axial images were obtained from the base of the skull through the vertex without intravenous contrast. RADIATION DOSE REDUCTION: This exam was performed according to the departmental dose-optimization program which includes automated exposure control, adjustment of the mA and/or kV according to patient size and/or use of iterative reconstruction technique. COMPARISON:   11/22/2008 FINDINGS: Brain: No evidence of acute infarction, hemorrhage, mass, mass effect, or midline shift. No hydrocephalus or extra-axial collection. 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 Vascular: No hyperdense vessel. Atherosclerotic calcifications in the intracranial carotid and vertebral arteries. Skull: Negative for fracture or focal lesion. Sinuses/Orbits: No acute finding. Other: The mastoid air cells are well aerated. ASPECTS [facility] Stroke Program Early CT Score) - Ganglionic level infarction (caudate, lentiform nuclei, internal capsule, insula, M1-M3 cortex): 7 - Supraganglionic infarction (M4-M6 cortex): 3 Total score (0-10 with 10 being normal): 10 IMPRESSION: 1. No acute intracranial process. 2. ASPECTS is 10 Code stroke imaging results were communicated on 11/01/2022 at 6:58 pm to provider [facility] via telephone, who verbally acknowledged these results. Electronically Signed   By: Wiliam Ke M.D.   On: 11/01/2022 18:59             LOS: 2 days       Sunnie Nielsen, DO [facility] 11/04/2022, 4:11 PM    Dictation software may have been used to generate the above note. Typos may occur and escape review in typed/dictated notes. Please contact Dr Lyn Hollingshead directly for clarity if needed.  Staff may message me via secure chat in Epic  but  this may not receive an immediate response,  please page me for urgent matters!  If 7PM-7AM, please contact night coverage [email or web address]

## 2022-11-05 ENCOUNTER — Inpatient Hospital Stay: Payer: Medicare Other

## 2022-11-05 DIAGNOSIS — R9431 Abnormal electrocardiogram [ECG] [EKG]: Secondary | ICD-10-CM | POA: Diagnosis not present

## 2022-11-05 DIAGNOSIS — W19XXXA Unspecified fall, initial encounter: Secondary | ICD-10-CM | POA: Diagnosis not present

## 2022-11-05 DIAGNOSIS — F1010 Alcohol abuse, uncomplicated: Secondary | ICD-10-CM | POA: Diagnosis not present

## 2022-11-05 DIAGNOSIS — I998 Other disorder of circulatory system: Secondary | ICD-10-CM | POA: Diagnosis not present

## 2022-11-05 LAB — COMPREHENSIVE METABOLIC PANEL
ALT: 58 U/L — ABNORMAL HIGH (ref 0–44)
AST: 243 U/L — ABNORMAL HIGH (ref 15–41)
Albumin: 3.6 g/dL (ref 3.5–5.0)
Alkaline Phosphatase: 38 U/L (ref 38–126)
Anion gap: 10 (ref 5–15)
BUN: 11 mg/dL (ref 8–23)
CO2: 22 mmol/L (ref 22–32)
Calcium: 7.8 mg/dL — ABNORMAL LOW (ref 8.9–10.3)
Chloride: 106 mmol/L (ref 98–111)
Creatinine, Ser: 0.95 mg/dL (ref 0.61–1.24)
GFR, Estimated: >60 mL/min (ref >60)
Glucose, Bld: 130 mg/dL — ABNORMAL HIGH (ref 70–99)
Potassium: 3.5 mmol/L (ref 3.5–5.1)
Sodium: 138 mmol/L (ref 135–145)
Total Bilirubin: 1.3 mg/dL — ABNORMAL HIGH (ref 0.3–1.2)
Total Protein: 6.2 g/dL — ABNORMAL LOW (ref 6.5–8.1)

## 2022-11-05 LAB — HEPARIN LEVEL (UNFRACTIONATED)
Heparin Unfractionated: 0.33 IU/mL (ref 0.30–0.70)
Heparin Unfractionated: 0.32 IU/mL (ref 0.30–0.70)

## 2022-11-05 LAB — CBC
HCT: 31.5 % — ABNORMAL LOW (ref 39.0–52.0)
Hemoglobin: 10.3 g/dL — ABNORMAL LOW (ref 13.0–17.0)
MCH: 29.3 pg (ref 26.0–34.0)
MCHC: 32.7 g/dL (ref 30.0–36.0)
MCV: 89.5 fL (ref 80.0–100.0)
Platelets: 163 10*3/uL (ref 150–400)
RBC: 3.52 MIL/uL — ABNORMAL LOW (ref 4.22–5.81)
RDW: 14.7 % (ref 11.5–15.5)
WBC: 14.4 10*3/uL — ABNORMAL HIGH (ref 4.0–10.5)
nRBC: 0 % (ref 0.0–0.2)

## 2022-11-05 LAB — URINALYSIS, ROUTINE W REFLEX MICROSCOPIC
Bacteria, UA: NONE SEEN
Bilirubin Urine: NEGATIVE
Glucose, UA: NEGATIVE mg/dL
Ketones, ur: 5 mg/dL — AB
Nitrite: NEGATIVE
Protein, ur: 100 mg/dL — AB
RBC / HPF: >50 RBC/hpf (ref 0–5)
Specific Gravity, Urine: 1.029 (ref 1.005–1.030)
WBC, UA: >50 WBC/hpf (ref 0–5)
pH: 5 (ref 5.0–8.0)

## 2022-11-05 MED ORDER — IOHEXOL 350 MG/ML SOLN
75.0000 mL | Freq: Once | INTRAVENOUS | Status: AC | PRN
  Administered 2022-11-05: 75 mL via INTRAVENOUS

## 2022-11-05 MED ORDER — CHLORHEXIDINE GLUCONATE CLOTH 2 % EX PADS
6.0000 | MEDICATED_PAD | Freq: Every day | CUTANEOUS | Status: DC
  Administered 2022-11-05 – 2022-11-10 (×6): 6 via TOPICAL

## 2022-11-05 MED ORDER — SODIUM CHLORIDE 0.9 % IV SOLN
1.0000 g | INTRAVENOUS | Status: AC
  Administered 2022-11-05 – 2022-11-07 (×3): 1 g via INTRAVENOUS
  Filled 2022-11-05 (×3): qty 10

## 2022-11-05 MED ORDER — TRAZODONE HCL 50 MG PO TABS
25.0000 mg | ORAL_TABLET | Freq: Once | ORAL | Status: AC
  Administered 2022-11-05: 25 mg via ORAL
  Filled 2022-11-05: qty 1

## 2022-11-05 NOTE — Progress Notes (Signed)
ANTICOAGULATION CONSULT NOTE  Pharmacy Consult for Heparin Infusion Indication: DVT  Patient Measurements: Height: 5\' 7"  (170.2 cm) Weight: 71.2 kg (156 lb 15.5 oz) (weight verified by RN as wwll, 1 top sheet, 1 blanket, 1 chux pad, 1 pillow) IBW/kg (Calculated) : 66.1 Heparin Dosing Weight: 72.9 kg  Labs: Recent Labs    11/03/22 0545 11/03/22 1255 11/03/22 1909 11/04/22 0643 11/05/22 0119  HGB 12.7*  --   --  12.7* 10.3*  HCT 38.3*  --   --  38.6* 31.5*  PLT 148*  --   --  161 163  APTT  --   --   --  71*  --   LABPROT  --   --   --  14.1  --   INR  --   --   --  1.1  --   HEPARINUNFRC 0.82*   < > 0.62 0.37 0.33  CREATININE 1.00  --   --  0.87 0.95  CKTOTAL 13,747*  --   --   --   --    < > = values in this interval not displayed.    Estimated Creatinine Clearance: 70.5 mL/min (by C-G formula based on SCr of 0.95 mg/dL).  Medical History: Past Medical History:  Diagnosis Date   Alcohol use disorder, mild, abuse    COPD (chronic obstructive pulmonary disease) (HCC)    Gout    Hyperlipidemia    Hypertension    Hypothyroid    Peripheral vascular complication    Stroke [facility])     Assessment: Pt is a 67 yo male presenting to ED for fall and c/o left leg pain, found with multiple arterial occlusions in lower extremities.Pt w/ med hx of Eliquis in the past, but stated he has not been taking medication recently.  Date Time HL 6/12 1315  >1.1 6/12 2240 0.98, supratherapeutic 6/13 0545 0.82, supratherapeutic 6/13 1255 0.68, therapeutic x 1 6/13 1909 0.62, therapeutic x 2 6/14 0643 0.37, therapeutic 6/15 0119 0.33, therapeutic x 4 (after restart)  Goal of Therapy:  Heparin level 0.3-0.7 units/ml Monitor platelets by anticoagulation protocol: Yes   Plan: s/p artery endarectomies, and angioplasty with stents on 11/04/22 for PAD. Also with DVT. No intervention planned for DVT.  --Continue heparin infusion at 700 units/hr --Recheck HL in 6 hours to confirm --CBC  daily  Otelia Sergeant, PharmD, [facility] 11/05/2022 2:04 AM

## 2022-11-05 NOTE — Progress Notes (Signed)
Patient appeared to be fidgety and restless. At this time, this RN noticed that the patient had self-removed the wound vac on the left groin. Incision is approximated with 19 staples and no bleeding/oozing. Manuela Schwartz NP made aware at 12:40 A.M., and vascular was paged. Discussed with Heath Lark MD at 12:44 A.M., and instructed to cover with clean dressing. ABD pad and transparent dressing applied.

## 2022-11-05 NOTE — Anesthesia Postprocedure Evaluation (Signed)
Anesthesia Post Note  Patient: Corey Olson  Procedure(s) Performed: ENDARTERECTOMY FEMORAL (Bilateral: Groin) APPLICATION OF CELL SAVER (Bilateral: Groin) INSERTION OF ILIAC STENT (Bilateral: Groin)  Patient location during evaluation: PACU Anesthesia Type: General Level of consciousness: awake and alert Pain management: pain level controlled Vital Signs Assessment: post-procedure vital signs reviewed and stable Respiratory status: spontaneous breathing, nonlabored ventilation, respiratory function stable and patient connected to nasal cannula oxygen Cardiovascular status: blood pressure returned to baseline and stable Postop Assessment: no apparent nausea or vomiting Anesthetic complications: no   No notable events documented.   Last Vitals:  Vitals:   11/05/22 1000 11/05/22 1100  BP: 98/71 128/63  Pulse: (!) 54 (!) 108  Resp: (!) 21 (!) 24  Temp:    SpO2: 100% 96%    Last Pain:  Vitals:   11/05/22 0800  TempSrc:   PainSc: 0-No pain                 Corinda Gubler

## 2022-11-05 NOTE — Evaluation (Signed)
Physical Therapy Evaluation Patient Details Name: Corey Olson MRN: [id number] DOB: 11-02-1955 Today's Date: 11/05/2022  History of Present Illness  Pt is a 67 yo male s/p bilateral femoral endarterectomy and iliac stenting. 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 PMH of COPD, gout, HLD, HTN, PVD, CVA, etoh.   Clinical Impression  Pt oriented to self, unable to provide PLOF but did state he lives with his sister Corey Olson. PT attempted to contact family via phone, no answer. Pt stated he normally uses a WC but was unable to describe how he transfers.  MaxAx2 to supine <> sit twice during session. Pt able to facilitate moving BLE, but ultimately required assistance, as well as for trunk elevation. Limited use of RUE noted throughout session. Progressed to sitting EOB with fair balance, but BP 60/41, and pt assisted to return to supine. Re-attempted and BP remained soft (79/61). Further mobility deferred.  Overall the patient demonstrated deficits (see "PT Problem List") that impede the patient's functional abilities, safety, and mobility and would benefit from skilled PT intervention. Recommendation is to continue skilled PT services to maximize function and independence.        Recommendations for follow up therapy are one component of a multi-disciplinary discharge planning process, led by the attending physician.  Recommendations may be updated based on patient status, additional functional criteria and insurance authorization.  Follow Up Recommendations Can patient physically be transported by private vehicle: No     Assistance Recommended at Discharge Frequent or constant Supervision/Assistance  Patient can return home with the following  Two people to help with walking and/or transfers;Two people to help with bathing/dressing/bathroom;Assistance with  feeding;Help with stairs or ramp for entrance;Assistance with cooking/housework;Direct supervision/assist for medications management;Assist for transportation    Equipment Recommendations Other (comment) (TBD at next venue of care)  Recommendations for Other Services       Functional Status Assessment Patient has had a recent decline in their functional status and demonstrates the ability to make significant improvements in function in a reasonable and predictable amount of time.     Precautions / Restrictions Precautions Precautions: Fall Precaution Comments: watch BP Restrictions Weight Bearing Restrictions: No      Mobility  Bed Mobility Overal bed mobility: Needs Assistance Bed Mobility: Supine to Sit, Sit to Supine     Supine to sit: Max assist, +2 for physical assistance Sit to supine: +2 for physical assistance, Max assist        Transfers                   General transfer comment: deferred due to low BP    Ambulation/Gait                  Stairs            Wheelchair Mobility    Modified Rankin (Stroke Patients Only)       Balance Overall balance assessment: Needs assistance Sitting-balance support: Feet supported Sitting balance-Leahy Scale: Poor Sitting balance - Comments: second attempt progressed to fair balance with assistance to achieve midline.                                     Pertinent Vitals/Pain Pain Assessment Pain Assessment: Faces Faces Pain Scale: Hurts even more  Pain Location: pt unable to specify Pain Descriptors / Indicators: Grimacing, Guarding, Moaning Pain Intervention(s): Monitored during session, Limited activity within patient's tolerance, Repositioned    Home Living Family/patient expects to be discharged to:: Private residence   Available Help at Discharge: Family ("Corey Olson" his sister)                    Prior Function Prior Level of Function : Patient poor  historian/Family not available             Mobility Comments: per pt he uses a WC, but unable to describe how he gets to the [facility]       Hand Dominance        Extremity/Trunk Assessment   Upper Extremity Assessment Upper Extremity Assessment: Defer to OT evaluation    Lower Extremity Assessment Lower Extremity Assessment: Generalized weakness (unable to truly lift either LE against gravity. some ability to SAQ in LLE more than RLE)       Communication      Cognition Arousal/Alertness: Awake/alert Behavior During Therapy: Flat affect Overall Cognitive Status: Difficult to assess                                 General Comments: Pt answered "yes" "no" to almost all questions. unable to provide PLOF, oriented to name. delayed processing noted        General Comments      Exercises     Assessment/Plan    PT Assessment Patient needs continued PT services  PT Problem List Decreased strength;Decreased mobility;Decreased activity tolerance;Decreased balance;Decreased knowledge of use of DME;Decreased knowledge of precautions;Decreased range of motion       PT Treatment Interventions DME instruction;Therapeutic activities;Gait training;Therapeutic exercise;Patient/family education;Stair training;Balance training;Functional mobility training;Neuromuscular re-education;Wheelchair mobility training    PT Goals (Current goals can be found in the Care Plan section)  Acute Rehab PT Goals PT Goal Formulation: Patient unable to participate in goal setting Time For Goal Achievement: 11/19/22 Potential to Achieve Goals: Fair    Frequency Min 2X/week     Co-evaluation PT/OT/SLP Co-Evaluation/Treatment: Yes Reason for Co-Treatment: Complexity of the patient's impairments (multi-system involvement);For patient/therapist safety;To address functional/ADL transfers PT goals addressed during session: Mobility/safety with mobility;Balance OT goals addressed during  session: ADL's and self-care;Strengthening/ROM       AM-PAC PT "6 Clicks" Mobility  Outcome Measure Help needed turning from your back to your side while in a flat bed without using bedrails?: A Lot Help needed moving from lying on your back to sitting on the side of a flat bed without using bedrails?: Total Help needed moving to and from a bed to a chair (including a wheelchair)?: Total Help needed standing up from a chair using your arms (e.g., wheelchair or bedside chair)?: Total Help needed to walk in hospital room?: Total Help needed climbing 3-5 steps with a railing? : Total 6 Click Score: 7    End of Session   Activity Tolerance: Patient limited by fatigue;Other (comment) (limited by BP) Patient left: in bed;with call bell/phone within reach;with bed alarm set Nurse Communication: Mobility status;Other (comment) (low BP with mobility) PT Visit Diagnosis: Other abnormalities of gait and mobility (R26.89);Muscle weakness (generalized) (M62.81);Difficulty in walking, not elsewhere classified (R26.2)    Time: 3244-0102 PT Time Calculation (min) (ACUTE ONLY): 24 min   Charges:   PT Evaluation $PT Eval Moderate Complexity: 1 Mod PT Treatments $Therapeutic Activity: 8-22 mins  Olga Coaster PT, DPT 2:26 PM,11/05/22

## 2022-11-05 NOTE — Progress Notes (Signed)
ANTICOAGULATION CONSULT NOTE  Pharmacy Consult for Heparin Infusion Indication: DVT  Patient Measurements: Height: 5\' 7"  (170.2 cm) Weight: 71.2 kg (156 lb 15.5 oz) (weight verified by RN as wwll, 1 top sheet, 1 blanket, 1 chux pad, 1 pillow) IBW/kg (Calculated) : 66.1 Heparin Dosing Weight: 72.9 kg  Labs: Recent Labs    11/03/22 0545 11/03/22 1255 11/04/22 0643 11/05/22 0119 11/05/22 0813  HGB 12.7*  --  12.7* 10.3*  --   HCT 38.3*  --  38.6* 31.5*  --   PLT 148*  --  161 163  --   APTT  --   --  71*  --   --   LABPROT  --   --  14.1  --   --   INR  --   --  1.1  --   --   HEPARINUNFRC 0.82*   < > 0.37 0.33 0.32  CREATININE 1.00  --  0.87 0.95  --   CKTOTAL 13,747*  --   --   --   --    < > = values in this interval not displayed.    Estimated Creatinine Clearance: 70.5 mL/min (by C-G formula based on SCr of 0.95 mg/dL).  Medical History: Past Medical History:  Diagnosis Date   Alcohol use disorder, mild, abuse    COPD (chronic obstructive pulmonary disease) (HCC)    Gout    Hyperlipidemia    Hypertension    Hypothyroid    Peripheral vascular complication    Stroke [facility])     Assessment: Pt is a 67 yo male presenting to ED for fall and c/o left leg pain, found with multiple arterial occlusions in lower extremities.Pt w/ med hx of Eliquis in the past, but stated he has not been taking medication recently.  Date Time HL 6/12 1315  >1.1 6/12 2240 0.98, supratherapeutic 6/13 0545 0.82, supratherapeutic 6/13 1255 0.68, therapeutic x 1 6/13 1909 0.62, therapeutic x 2 6/14 0643 0.37, therapeutic x 3 6/15 0119 0.33, therapeutic x 4 (after restart) 6/15 0813 0.32, therapeutic x 5  Goal of Therapy:  Heparin level 0.3-0.7 units/ml Monitor platelets by anticoagulation protocol: Yes   Plan:  --Heparin level is therapeutic --Continue heparin infusion at 700 units/hr --Re-check HL tomorrow AM --CBC daily  Tressie Ellis 11/05/2022 9:00 AM

## 2022-11-05 NOTE — Progress Notes (Addendum)
PROGRESS NOTE    Corey Olson   WGN:[id number] DOB: 04-Dec-1955  DOA: 11/01/2022 Date of Service: 11/05/22 PCP: [name], MD     Brief Narrative / Hospital Course:  Corey Olson is a 67 y.o. male with a history of stroke, peripheral atrial disease who presents with altered mental status, left leg weakness.  Family spoke to him at 430 and felt that he was confused.  Prior to that he had not been spoken to since the night before when he had seemed normal.  Patient reports left leg weakness dragging his left foot while walking and decreased sensation, all of which were new but without clear timelines. 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  06/11: in ED, VSS, code stroke called Per neurology - recs MRI brain, no TPN. Admitted to hospitalist service. Elevated AST/ALT c/w EtOH but EtOH levels <10 and RUQ [place] (+)hepatic steatosis, , Lactic acid ok at 1.3, CBC no concerns, elevated D-Dimer, CK >14,000 and UA no UTI but (+)Hgb no significant RBC c/w rhabdo, EtOH <10, UDS neg. [place] bilateral LE (+)DVT L femoral and popliteal V.  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 Found to have weakneed pulses LLE and CTA runoff performed showing ischemia/severe PAD, on exam LLE is cold below mid-tibia so vascular consulted, continue heparin gtt, taken to cath lab for RLE and LLE angiogram. Significant vascular disease, limb likely not salvageable per Dr Wyn Quaker - prognosis poor for even AKA healing, will likely need extensive endarterectomy/stenting to even salvage an AKA. Can plan for surgery later this week, will need cardiac clearance  06/13: anticipate surgery tomorrow  06/14: endarterectomy/stents bilateral femoral  06/15: recovering in ICU. Continue heparin infusion. Has been in sinus tach w/ elevated temp, (+)SIRS, (+)UA concern for sepsis d/t UTI vs SIRS w/ other  medical issues and also eval for PE is pending.    Consultants:  Neurology Vascular surgery  Cardiology   Procedures: Bilateral LE angiogram 11/02/22 w/ Dr Wyn Quaker  endarterectomy/stents bilateral femoral 11/04/22 w/ Dr Wyn Quaker      ASSESSMENT & PLAN:   Principal Problem:   Fall Active Problems:   Fall at home, initial encounter   Tobacco abuse   PAD (peripheral artery disease) (HCC)   Essential hypertension   Alcohol abuse   RUE weakness   Non-traumatic rhabdomyolysis   Positive D dimer   Abnormal EKG   SIRS potentially d/t vascular illness as below vs sepsis d/t UTI (+)SIRS, w/ tachycardia, tachypnea, elevated WBC CXR --> no apparent pneumonia  UA --> UTI BCx, UCx Also eval for PE w/ CTA given high risk, in case needing thrombectomy   UTI Ceftriaxone initiated 11/05/22  Await cultures   LLE w/ severe PAD, ischemic L foot w/ neurological deficits likely cause for fall S/p angiography  Vascular surgery following, pt is s/p surgical intervention w/ endarterectomy/stenting to optimize for AKA   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 HTN HLD Previous Lexiscan w/o ischemia Per cardiology note 02/2021 "high risk, if he develops chest pain with exertion or dyspnea, will schedule left heart cath." Continuing on heparin infusion per vascular team  We were holding statin given transaminitis but risk of holding may be greater than risk to liver, continue statin and will monitor LFT's closely  Weakness and fall Confusion / AMS question toxic metabolic encephalopathy or CVA Likely multifactorial  Pressing concern for ischemic LLE as above Concern for CVA w/ MRI brain pending Potential EtOH withdrawal  Treat multiple underlying issues  PT/OT to eval when more stable  Fall precaution   DVT L femoral and popliteal V  Heparin gtt Vascular surgery following but no intervention needed at this time for the DVT    Rhabdomyolysis w/ normal renal function  IV  fluids Follow renal function and CK   History CVA w/ residual R sided deficits (+)subacute R cerebellar punctate infarct on MRI ASA We were holding statin given transaminitis but risk of holding may be greater than risk to liver, will monitor LFT's closely Neurology following  On heparin gtt for DVT and ischemic leg  Transaminitis  Hepatic Steatosis on RUQ [place] EtOH use but no acute intoxication based on blood levels - question alcoholic hepatitis  Follow CMP Hepatitis panel pending  We were holding statin given transaminitis but risk of holding may be greater than risk to liver, will monitor LFT's closely  EtOH use but no acute intoxication based on blood levels  CIWA protocol  Thiamine  Hypokalemia Replace as needed Monitor BMP  Essential HTN Permissive HTN in light of CVA - can begin to lower BP goal   Tobacco abuse Nicotine patch     DVT: treating w/ heparin  Pertinent IV fluids/nutrition: NS 100 mL/h Central lines / invasive devices: none  Code Status: FULL CODE ACP documentation reviewed: 11/02/22 none on file   Current Admission Status: inpatient   TOC needs / Dispo plan: TBD expect will need SNF Barriers to discharge / significant pending items: SIRS, possible amputation              Subjective / Brief ROS:  Patient examined in IC Alert Denies chest pain, SOB or palpitations Alert, no concerns    Family Communication: none at this time    Objective Findings:  Vitals:   11/05/22 1000 11/05/22 1100 11/05/22 1200 11/05/22 1300  BP: 98/71 128/63 (!) 110/51 (!) 94/55  Pulse: (!) 54 (!) 108 (!) 101 100  Resp: (!) 21 (!) 24 (!) 24 (!) 22  Temp:   99.2 F (37.3 C)   TempSrc:      SpO2: 100% 96% 98% 97%  Weight:      Height:        Intake/Output Summary (Last 24 hours) at 11/05/2022 1558 Last data filed at 11/04/2022 2000 Gross per 24 hour  Intake 440 ml  Output 450 ml  Net -10 ml   Filed Weights   11/01/22 1841 11/03/22 0413  11/04/22 0328  Weight: 72.9 kg 80.1 kg 71.2 kg    Examination:  Physical Exam Constitutional:      General: He is not in acute distress. Cardiovascular:     Rate and Rhythm: Regular rhythm. Tachycardia present.  Pulmonary:     Effort: Pulmonary effort is normal. No respiratory distress.     Breath sounds: No rhonchi.  Abdominal:     General: There is no distension.     Palpations: Abdomen is soft.  Musculoskeletal:     Right lower leg: No edema.     Left lower leg: No edema.  Neurological:     Mental Status: He is alert.  Psychiatric:        Behavior: Behavior normal.          Scheduled Medications:   aspirin EC  81 mg Oral Daily   atorvastatin  80 mg Oral QPM   Chlorhexidine Gluconate Cloth  6 each Topical Daily   metoprolol succinate  50 mg Oral Daily   nicotine  21 mg Transdermal Daily   pantoprazole  40 mg Oral BID   sodium chloride flush  3 mL Intravenous Q12H   thiamine  100 mg Oral Daily    Continuous Infusions:  sodium chloride 100 mL/hr at 11/05/22 0604   cefTRIAXone (ROCEPHIN)  IV     heparin 700 Units/hr (11/04/22 1829)    PRN Medications:  acetaminophen **OR** acetaminophen, ondansetron (ZOFRAN) IV  Antimicrobials from admission:  Anti-infectives (From admission, onward)    Start     Dose/Rate Route Frequency Ordered Stop   11/05/22 1645  cefTRIAXone (ROCEPHIN) 1 g in sodium chloride 0.9 % 100 mL IVPB        1 g 200 mL/hr over 30 Minutes Intravenous Every 24 hours 11/05/22 1555     11/04/22 0634  ceFAZolin (ANCEF) IVPB 2g/100 mL premix        2 g 200 mL/hr over 30 Minutes Intravenous 30 min pre-op 11/04/22 0634 11/04/22 1251   11/02/22 1431  ceFAZolin (ANCEF) IVPB 2g/100 mL premix        2 g 200 mL/hr over 30 Minutes Intravenous 30 min pre-op 11/02/22 1431 11/02/22 1653           Data Reviewed:  I have personally reviewed the following...  CBC: Recent Labs  Lab 11/01/22 1843 11/02/22 0625 11/03/22 0545 11/04/22 0643  11/05/22 0119  WBC 9.3 9.7 10.2 12.2* 14.4*  NEUTROABS 7.5  --   --   --   --   HGB 14.5 13.5 12.7* 12.7* 10.3*  HCT 44.9 41.8 38.3* 38.6* 31.5*  MCV 90.3 90.1 88.5 88.1 89.5  PLT 156 159 148* 161 163   Basic Metabolic Panel: Recent Labs  Lab 11/01/22 1843 11/01/22 2001 11/02/22 0625 11/03/22 0545 11/04/22 0643 11/05/22 0119  NA 136  --  139 139 138 138  K 3.5  --  3.4* 3.0* 3.0* 3.5  CL 102  --  104 106 103 106  CO2 19*  --  21* 24 25 22   GLUCOSE 88  --  84 106* 123* 130*  BUN 14  --  12 11 9 11   CREATININE 1.08  --  0.98 1.00 0.87 0.95  CALCIUM 8.5*  --  7.8* 8.4* 8.5* 7.8*  MG  --  2.0  --   --   --   --    GFR: Estimated Creatinine Clearance: 70.5 mL/min (by C-G formula based on SCr of 0.95 mg/dL). Liver Function Tests: Recent Labs  Lab 11/01/22 1843 11/02/22 0625 11/03/22 0545 11/04/22 0643 11/05/22 0119  AST 296* 366* 408* 426* 243*  ALT 63* 74* 75* 80* 58*  ALKPHOS 55 57 49 49 38  BILITOT 1.3* 1.6* 0.9 0.7 1.3*  PROT 7.0 6.6 6.3* 6.7 6.2*  ALBUMIN 4.2 3.7 3.3* 3.5 3.6   Recent Labs  Lab 11/01/22 2001  LIPASE 27   No results for input(s): "AMMONIA" in the last 168 hours. Coagulation Profile: Recent Labs  Lab 11/01/22 1843 11/04/22 0643  INR 1.0 1.1   Cardiac Enzymes: Recent Labs  Lab 11/01/22 2001 11/03/22 0545  CKTOTAL 14,578* 13,747*   BNP (last 3 results) No results for input(s): "PROBNP" in the last 8760 hours. HbA1C: Recent Labs    11/04/22 0643  HGBA1C 5.3   CBG: Recent Labs  Lab 11/01/22 1842 11/02/22 0215 11/04/22 1743  GLUCAP 91 84 142*   Lipid Profile: Recent Labs  11/04/22 0643  CHOL 101  HDL 35*  LDLCALC 49  TRIG 83  CHOLHDL 2.9   Thyroid Function Tests: No results for input(s): "TSH", "T4TOTAL", "FREET4", "T3FREE", "THYROIDAB" in the last 72 hours. Anemia Panel: No results for input(s): "VITAMINB12", "FOLATE", "FERRITIN", "TIBC", "IRON", "RETICCTPCT" in the last 72 hours. Most Recent Urinalysis On  File:     Component Value Date/Time   COLORURINE YELLOW (A) 11/05/2022 1450   APPEARANCEUR CLOUDY (A) 11/05/2022 1450   LABSPEC 1.029 11/05/2022 1450   PHURINE 5.0 11/05/2022 1450   GLUCOSEU NEGATIVE 11/05/2022 1450   HGBUR LARGE (A) 11/05/2022 1450   BILIRUBINUR NEGATIVE 11/05/2022 1450   KETONESUR 5 (A) 11/05/2022 1450   PROTEINUR 100 (A) 11/05/2022 1450   NITRITE NEGATIVE 11/05/2022 1450   LEUKOCYTESUR MODERATE (A) 11/05/2022 1450   Sepsis Labs: @LABRCNTIP (procalcitonin:4,lacticidven:4) Microbiology: No results found for this or any previous visit (from the past 240 hour(s)).    Radiology Studies last 3 days: DG C-Arm 1-60 Min-No Report  Result Date: 11/04/2022 Fluoroscopy was utilized by the requesting physician.  No radiographic interpretation.   ECHOCARDIOGRAM COMPLETE  Result Date: 11/03/2022    ECHOCARDIOGRAM REPORT   Patient Name:   Corey Olson Date of Exam: 11/03/2022 Medical Rec #:  [id number]    Height:       67.0 in Accession #:    [id number]   Weight:       176.6 lb Date of Birth:  [birth year]     BSA:          1.918 m Patient Age:    67 years     BP:           157/70 mmHg Patient Gender: M            HR:           97 bpm. Exam Location:  [facility] Procedure: 2D Echo, Cardiac Doppler, Color Doppler, Strain Analysis and 3D Echo Indications:     CAD  History:         Patient has prior history of Echocardiogram examinations, most                  recent 07/09/2020. CAD, Abnormal ECG, Stroke, PAD and COPD; Risk                  Factors:Hypertension, Dyslipidemia and Current Smoker. ETOH                  abuse.  Sonographer:     Mikki Harbor Referring Phys:  [id number] Antonieta Iba Diagnosing Phys: Julien Nordmann MD  Sonographer Comments: Global longitudinal strain was attempted. IMPRESSIONS  1. Left ventricular ejection fraction, by estimation, is 50 to 55%. The left ventricle has low normal function. The left ventricle has no regional wall motion abnormalities. Left ventricular  diastolic parameters are consistent with Grade I diastolic dysfunction (impaired relaxation). The average left ventricular global longitudinal strain is -8.9 %.  2. Right ventricular systolic function is normal. The right ventricular size is normal.  3. The mitral valve is normal in structure. Mild mitral valve regurgitation. No evidence of mitral stenosis.  4. The aortic valve is tricuspid. Aortic valve regurgitation is not visualized. Aortic valve sclerosis is present, with no evidence of aortic valve stenosis.  5. There is borderline dilatation of the aortic root, measuring 37 mm.  6. The inferior vena cava is normal in size with greater than 50% respiratory variability, suggesting right atrial pressure of 3 mmHg. FINDINGS  Left Ventricle: Left ventricular ejection fraction, by estimation, is 50 to 55%. The left ventricle has low normal function. The left ventricle has no regional wall motion abnormalities. The average left ventricular global longitudinal strain is -8.9 %.  The left ventricular internal cavity size was normal in size. There is no left ventricular hypertrophy. Left ventricular diastolic parameters are consistent with Grade I diastolic dysfunction (impaired relaxation). Right Ventricle: The right ventricular size is normal. No increase in right ventricular wall thickness. Right ventricular systolic function is normal. Left Atrium: Left atrial size was normal in size. Right Atrium: Right atrial size was normal in size. Pericardium: There is no evidence of pericardial effusion. Mitral Valve: The mitral valve is normal in structure. Mild mitral valve regurgitation. No evidence of mitral valve stenosis. MV peak gradient, 6.9 mmHg. The mean mitral valve gradient is 2.0 mmHg. Tricuspid Valve: The tricuspid valve is normal in structure. Tricuspid valve regurgitation is mild . No evidence of tricuspid stenosis. Aortic Valve: The aortic valve is tricuspid. Aortic valve regurgitation is not visualized.  Aortic valve sclerosis is present, with no evidence of aortic valve stenosis. Aortic valve mean gradient measures 3.0 mmHg. Aortic valve peak gradient measures 7.0  mmHg. Aortic valve area, by VTI measures 2.55 cm. Pulmonic Valve: The pulmonic valve was normal in structure. Pulmonic valve regurgitation is not visualized. No evidence of pulmonic stenosis. Aorta: The aortic root is normal in size and structure. There is borderline dilatation of the aortic root, measuring 37 mm. Venous: The inferior vena cava is normal in size with greater than 50% respiratory variability, suggesting right atrial pressure of 3 mmHg. IAS/Shunts: No atrial level shunt detected by color flow Doppler.  LEFT VENTRICLE PLAX 2D LVIDd:         4.80 cm      Diastology LVIDs:         3.70 cm      LV e' medial:    4.68 cm/s LV PW:         1.50 cm      LV E/e' medial:  10.3 LV IVS:        1.20 cm      LV e' lateral:   5.22 cm/s LVOT diam:     2.10 cm      LV E/e' lateral: 9.2 LV SV:         66 LV SV Index:   34           2D Longitudinal Strain LVOT Area:     3.46 cm     2D Strain GLS Avg:     -8.9 %  LV Volumes (MOD) LV vol d, MOD A2C: 104.0 ml LV vol d, MOD A4C: 75.5 ml LV vol s, MOD A2C: 60.1 ml LV vol s, MOD A4C: 47.3 ml LV SV MOD A2C:     43.9 ml LV SV MOD A4C:     75.5 ml LV SV MOD BP:      38.4 ml RIGHT VENTRICLE RV Basal diam:  3.50 cm RV Mid diam:    3.50 cm RV S prime:     15.90 cm/s TAPSE (M-mode): 3.2 cm LEFT ATRIUM             Index        RIGHT ATRIUM           Index LA diam:        3.90 cm 2.03 cm/m   RA Area:     17.70 cm LA  Vol [facility]):   67.7 ml 35.30 ml/m  RA Volume:   48.10 ml  25.08 ml/m LA Vol (A4C):   32.5 ml 16.94 ml/m LA Biplane Vol: 48.7 ml 25.39 ml/m  AORTIC VALVE                    PULMONIC VALVE AV Area (Vmax):    2.60 cm     PV Vmax:       0.98 m/s AV Area (Vmean):   2.54 cm     PV Peak grad:  3.9 mmHg AV Area (VTI):     2.55 cm AV Vmax:           132.00 cm/s AV Vmean:          84.700 cm/s AV VTI:             0.258 m AV Peak Grad:      7.0 mmHg AV Mean Grad:      3.0 mmHg LVOT Vmax:         99.00 cm/s LVOT Vmean:        62.200 cm/s LVOT VTI:          0.190 m LVOT/AV VTI ratio: 0.74  AORTA Ao Root diam: 3.70 cm MITRAL VALVE MV Area (PHT): 4.80 cm     SHUNTS MV Area VTI:   2.32 cm     Systemic VTI:  0.19 m MV Peak grad:  6.9 mmHg     Systemic Diam: 2.10 cm MV Mean grad:  2.0 mmHg MV Vmax:       1.31 m/s MV Vmean:      64.4 cm/s MV Decel Time: 158 msec MV E velocity: 48.20 cm/s MV A velocity: 121.00 cm/s MV E/A ratio:  0.40 Julien Nordmann MD Electronically signed by Julien Nordmann MD Signature Date/Time: 11/03/2022/4:07:01 PM    Final    PERIPHERAL VASCULAR CATHETERIZATION  Result Date: 11/02/2022 See surgical note for result.  MR BRAIN WO CONTRAST  Result Date: 11/02/2022 CLINICAL DATA:  Stroke, follow-up. EXAM: MRI HEAD WITHOUT CONTRAST TECHNIQUE: Multiplanar, multiecho pulse sequences of the brain and surrounding structures were obtained without intravenous contrast. COMPARISON:  Head CT 11/01/2022. FINDINGS: Brain: Punctate subacute infarct in the right cerebellar hemisphere (image 7 series 5). No acute hemorrhage or significant mass effect. Encephalomalacia in the high left frontal lobe from prior ACA-MCA border zone infarct. No hydrocephalus or extra-axial collection. No mass or midline shift. 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 Skull and upper cervical spine: Normal marrow signal. Sinuses/Orbits: Unremarkable. Other: None. IMPRESSION: 1. Punctate subacute infarct in the right cerebellar hemisphere. No acute hemorrhage or significant mass effect. 2. 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 Electronically Signed   By: Orvan Falconer M.D.   On: 11/02/2022 13:28   CT ANGIO AO+BIFEM W & OR WO CONTRAST  Result Date: 11/02/2022 CLINICAL DATA:  Cold right foot, weak pulse.  Left foot cold, no palpable pulse. EXAM: CT ANGIOGRAPHY OF ABDOMINAL AORTA WITH ILIOFEMORAL RUNOFF TECHNIQUE: Multidetector CT imaging of the abdomen, pelvis and lower extremities was performed using the standard protocol during bolus administration of intravenous contrast. Multiplanar CT image reconstructions and MIPs were obtained to evaluate the vascular anatomy. RADIATION DOSE REDUCTION: This exam was performed according to the departmental dose-optimization program which includes automated exposure control, adjustment of the mA and/or kV  according to patient size and/or use of iterative reconstruction technique. CONTRAST:  OMNIPAQUE IOHEXOL 350 MG/ML SOLN COMPARISON:  None Available. FINDINGS: VASCULAR Aorta: Extensive atherosclerosis.  No aneurysm. Celiac: Extensive atherosclerosis. Widely patent. Vessel is diffusely ectatic. SMA: Arises from the celiac artery with extensive calcifications. Mild narrowing within the proximal to mid SMA. Renals: Single renal arteries bilaterally, heavily calcified, suspect moderate left renal artery stenosis. IMA: Patent RIGHT Lower Extremity Inflow: Heavily calcified common, internal and external right iliac artery. Stent seen within the right common carotid artery extending to near the common femoral artery. 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 Common and external iliac arteries patent. Outflow: Heavily calcified disease common femoral artery. There is occlusion of the proximal right SFA. Deep femoral artery heavily disease proximally with likely severe proximal stenosis. Contrast is seen within the vessel presumably related to collaterals. Runoff: Reconstitution of a severely diseased popliteal artery. Severely diseased trifurcation vessels. Dominant runoff appears to be via diseased anterior tibial artery. LEFT Lower Extremity Inflow: Left common iliac, external iliac arteries are occluded. Stent seen within the left external iliac artery. There is brief  reconstitution of the left common femoral artery which is severely diseased and stenotic. Outflow: Occlusion of the left superficial femoral artery and deep femoral artery. Runoff: No visible significant reconstitution in the left calf. Veins: No obvious venous abnormality within the limitations of this arterial phase study. Review of the MIP images confirms the above findings. NON-VASCULAR Lower chest: No acute abnormality. Hepatobiliary: Innumerable subcentimeter low-density lesions throughout the liver, likely small cysts. Gallbladder unremarkable. Pancreas: No focal abnormality or ductal dilatation. Spleen: No focal abnormality.  Normal size. Adrenals/Urinary Tract: Adrenal glands normal. Bilateral renal cysts, the largest in the upper pole on the right measuring 2.7 cm. No follow-up imaging recommended. No hydronephrosis. Urinary bladder grossly unremarkable. Stomach/Bowel: Left colonic diverticulosis. No active diverticulitis. Normal appendix. Stomach and small bowel decompressed, unremarkable. Lymphatic: No adenopathy Reproductive: Prostate enlargement. Other: No free fluid or free air. Musculoskeletal: No acute bony abnormality. 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 Severely diseased iliofemoral vessels on the right. Prior standing in the right common and external iliac arteries. 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 Dominant runoff in the right lower extremity via a diseased anterior tibial artery. 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 Reconstitution of a severely diseased common femoral artery with some flow in a severely diseased deep femoral artery. Left SFA and popliteal arteries occluded. No significant reconstitution in the left calf. NON-VASCULAR Left colonic diverticulosis. No active diverticulitis. No acute findings in the abdomen or pelvis.  Electronically Signed   By: Charlett Nose M.D.   On: 11/02/2022 02:38   US Venous Img Lower Bilateral (DVT)  Result Date: 11/02/2022 CLINICAL DATA:  Bilateral leg pain. EXAM: BILATERAL LOWER EXTREMITY VENOUS DOPPLER ULTRASOUND TECHNIQUE: Gray-scale sonography with graded compression, as well as color Doppler and duplex ultrasound were performed to evaluate the lower extremity deep venous systems from the level of the common femoral vein and including the common femoral, femoral, profunda femoral, popliteal and calf veins including the posterior tibial, peroneal and gastrocnemius veins when visible. The superficial great saphenous vein was also interrogated. Spectral Doppler was utilized to evaluate flow at rest and with distal augmentation maneuvers in the common femoral, femoral and popliteal veins. COMPARISON:  None Available. FINDINGS: RIGHT LOWER EXTREMITY Common Femoral Vein: No evidence of thrombus. Normal compressibility, respiratory phasicity  and response to augmentation. Saphenofemoral Junction: No evidence of thrombus. Normal compressibility and flow on color Doppler imaging. Profunda Femoral Vein: No evidence of thrombus. Normal compressibility and flow on color Doppler imaging. Femoral Vein: No evidence of thrombus. Normal compressibility, respiratory phasicity and response to augmentation. Popliteal Vein: No evidence of thrombus. Normal compressibility, respiratory phasicity and response to augmentation. Calf Veins: No evidence of thrombus. Normal compressibility and flow on color Doppler imaging. Superficial Great Saphenous Vein: No evidence of thrombus. Normal compressibility. Venous Reflux:  None. Other Findings:  None. LEFT LOWER EXTREMITY Common Femoral Vein: No evidence of thrombus. Normal compressibility, respiratory phasicity and response to augmentation. Saphenofemoral Junction: No evidence of thrombus. Normal compressibility and flow on color Doppler imaging. Profunda Femoral Vein: No  evidence of thrombus. Normal compressibility and flow on color Doppler imaging. Femoral Vein: Evidence of nonocclusive thrombus with abnormal compressibility, respiratory phasicity and response to augmentation. Popliteal Vein: Evidence of nonocclusive thrombus with abnormal compressibility, respiratory phasicity and response to augmentation. Calf Veins: No evidence of thrombus. Normal compressibility and flow on color Doppler imaging. Superficial Great Saphenous Vein: No evidence of thrombus. Normal compressibility. Venous Reflux:  None. Other Findings:  None. IMPRESSION: 1. 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 2. No evidence of DVT within the RIGHT lower extremity. Electronically Signed   By: Aram Candela M.D.   On: 11/02/2022 00:55   US Abdomen Limited RUQ (LIVER/GB)  Result Date: 11/02/2022 CLINICAL DATA:  Abnormal liver function test. EXAM: ULTRASOUND ABDOMEN LIMITED RIGHT UPPER QUADRANT COMPARISON:  None Available. FINDINGS: Gallbladder: No gallstones or wall thickening visualized (3.2 mm). No sonographic Murphy sign noted by sonographer. Common bile duct: Diameter: 3.8 mm Liver: Multiple small hepatic cysts are seen. The largest measures approximately 1.0 cm x 0.6 cm x 1.3 cm and is located within the left lobe of the liver. Diffusely increased echogenicity of the liver parenchyma is noted. Portal vein is patent on color Doppler imaging with normal direction of blood flow towards the liver. Other: Multiple simple cysts are seen within the right kidney. The largest measures approximately 3.4 cm x 2.8 cm x 2.8 cm. A small right pleural effusion is noted. IMPRESSION: 1. Hepatic steatosis with multiple hepatic and right renal cysts. 2. Small right pleural effusion. Electronically Signed   By: Aram Candela M.D.   On: 11/02/2022 00:52   CT HEAD CODE STROKE WO CONTRAST  Result Date: 11/01/2022 CLINICAL DATA:  Code stroke. Altered mental status,  confusion, left-sided weakness and numbness EXAM: CT HEAD WITHOUT CONTRAST TECHNIQUE: Contiguous axial images were obtained from the base of the skull through the vertex without intravenous contrast. RADIATION DOSE REDUCTION: This exam was performed according to the departmental dose-optimization program which includes automated exposure control, adjustment of the mA and/or kV according to patient size and/or use of iterative reconstruction technique. COMPARISON:  11/22/2008 FINDINGS: Brain: No evidence of acute infarction, hemorrhage, mass, mass effect, or midline shift. No hydrocephalus or extra-axial collection. 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 Vascular: No hyperdense vessel. Atherosclerotic calcifications in the intracranial carotid and vertebral arteries. Skull: Negative for fracture or focal lesion. Sinuses/Orbits: No acute finding. Other: The mastoid air cells are well aerated. ASPECTS [facility] Stroke Program Early CT Score) - Ganglionic level infarction (caudate, lentiform nuclei, internal capsule, insula, M1-M3 cortex): 7 - Supraganglionic infarction (M4-M6 cortex): 3 Total score (0-10 with 10 being normal): 10  IMPRESSION: 1. No acute intracranial process. 2. ASPECTS is 10 Code stroke imaging results were communicated on 11/01/2022 at 6:58 pm to provider [facility] via telephone, who verbally acknowledged these results. Electronically Signed   By: Wiliam Ke M.D.   On: 11/01/2022 18:59             LOS: 3 days       Sunnie Nielsen, DO [facility] 11/05/2022, 3:58 PM    Dictation software may have been used to generate the above note. Typos may occur and escape review in typed/dictated notes. Please contact Dr Lyn Hollingshead directly for clarity if needed.  Staff may message me via secure chat in Epic  but this may not receive an immediate response,  please page me for urgent  matters!  If 7PM-7AM, please contact night coverage [email or web address]

## 2022-11-05 NOTE — Progress Notes (Signed)
[facility] [place] PROGRESS NOTE  ASSESSMENT / PLAN: Corey Olson is a 67 y.o. male status post bilateral femoral endarterectomy and iliac stenting for  rest pain and neurologic changes in the left lower extremity. Neurologic deficit was present before surgery and remains unchanged after revascularization.  PRN pain control Diet as tolerated PT / OT / OOB  Guarded prognosis for left lower extremity; our suspicion is that it was densely ischemic for some time and neurologic deficits may not be recoverable, even with revascularization. No urgent / emergent need for further intervention at this time. Continue heparin infusion. OK to transfer to the floor.  SUBJECTIVE: Doing well today. No complaints. Not able to move the left toes / foot / ankle. No sensation in the left foot. This is not new. He is able to move his right foot / ankle somewhat.   OBJECTIVE: BP (!) 110/54   Pulse (!) 111   Temp 99.5 F (37.5 C)   Resp (!) 21   Ht 5\' 7"  (1.702 m)   Wt 71.2 kg Comment: weight verified by RN as wwll, 1 top sheet, 1 blanket, 1 chux pad, 1 pillow  SpO2 96%   BMI 24.58 kg/m   Intake/Output Summary (Last 24 hours) at 11/05/2022 1112 Last data filed at 11/04/2022 2000 Gross per 24 hour  Intake 2900 ml  Output 850 ml  Net 2050 ml    Chronically ill man in no distress Mild sinus tachycardia Unlabored breathing Doppler flow in R DP Venous doppler flow in L PT Good doppler flow in R popliteal artery No arterial doppler flow in L pedal arteries No motor or sensory function in the left foot/ankle     Latest Ref Rng & Units 11/05/2022    1:19 AM 11/04/2022    6:43 AM 11/03/2022    5:45 AM  CBC  WBC 4.0 - 10.5 K/uL 14.4  12.2  10.2   Hemoglobin 13.0 - 17.0 g/dL 40.9  81.1  91.4   Hematocrit 39.0 - 52.0 % 31.5  38.6  38.3   Platelets 150 - 400 K/uL 163  161  148         Latest Ref Rng & Units 11/05/2022    1:19 AM 11/04/2022    6:43 AM 11/03/2022    5:45 AM   CMP  Glucose 70 - 99 mg/dL 782  956  213   BUN 8 - 23 mg/dL 11  9  11    Creatinine 0.61 - 1.24 mg/dL 0.86  5.78  4.69   Sodium 135 - 145 mmol/L 138  138  139   Potassium 3.5 - 5.1 mmol/L 3.5  3.0  3.0   Chloride 98 - 111 mmol/L 106  103  106   CO2 22 - 32 mmol/L 22  25  24    Calcium 8.9 - 10.3 mg/dL 7.8  8.5  8.4   Total Protein 6.5 - 8.1 g/dL 6.2  6.7  6.3   Total Bilirubin 0.3 - 1.2 mg/dL 1.3  0.7  0.9   Alkaline Phos 38 - 126 U/L 38  49  49   AST 15 - 41 U/L 243  426  408   ALT 0 - 44 U/L 58  80  75     Estimated Creatinine Clearance: 70.5 mL/min (by C-G formula based on SCr of 0.95 mg/dL).  Corey Olson. Corey Antu, MD [facility] [place] Phone Number: [phone] 11/05/2022 11:12 AM

## 2022-11-05 NOTE — Evaluation (Signed)
Occupational Therapy Evaluation Patient Details Name: Corey Olson MRN: [id number] DOB: 03/26/1956 Today's Date: 11/05/2022   History of Present Illness Pt is a 67 yo male s/p bilateral femoral endarterectomy and iliac stenting. 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 PMH of COPD, gout, HLD, HTN, PVD, CVA, etoh.   Clinical Impression   Patient received for OT evaluation. See flowsheet below for details of function. Generally, patient requiring MAX A x2 for bed mobility, unable to attempt functional mobility 2/2 BP dropping, and MAX A overall for ADLs. Patient will benefit from continued OT while in acute care.       Recommendations for follow up therapy are one component of a multi-disciplinary discharge planning process, led by the attending physician.  Recommendations may be updated based on patient status, additional functional criteria and insurance authorization.   Assistance Recommended at Discharge Frequent or constant Supervision/Assistance  Patient can return home with the following Two people to help with walking and/or transfers;Two people to help with bathing/dressing/bathroom;Assistance with cooking/housework;Direct supervision/assist for medications management;Direct supervision/assist for financial management;Assist for transportation;Help with stairs or ramp for entrance    Functional Status Assessment  Patient has had a recent decline in their functional status and demonstrates the ability to make significant improvements in function in a reasonable and predictable amount of time.  Equipment Recommendations  Other (comment) (defer to next venue of care)    Recommendations for Other Services       Precautions / Restrictions Precautions Precautions: Fall Precaution Comments: watch BP Restrictions Weight Bearing Restrictions: No       Mobility Bed Mobility Overal bed mobility: Needs Assistance Bed Mobility: Supine to Sit, Sit to Supine     Supine to sit: Max assist, +2 for physical assistance Sit to supine: +2 for physical assistance, Max assist        Transfers                   General transfer comment: deferred due to low BP      Balance Overall balance assessment: Needs assistance Sitting-balance support: Feet supported Sitting balance-Leahy Scale: Poor Sitting balance - Comments: Progressed to fair balance with assistance to achieve midline. Unable to maintain trunk balance while trying to kick feet out in front.                                   ADL either performed or assessed with clinical judgement   ADL Overall ADL's : Needs assistance/impaired                                       General ADL Comments: Unsure what pt's functional baseline is or how far he is from it at this time. However, currently will require dependent bed level assist for toileting, LB dressing, anticipate MAX A UB dressing, MIN-MOD A grooming; set up (cutting up food) for eating. Pt did eat with his L hand (not using fork that was available to him, despite the food being chicken with gravy); able to pick up cup with L hand and drink through straw. Unable to safely transfer today due to LE deficits and BP dropping.     Vision Baseline Vision/History: 1  Wears glasses Patient Visual Report:  (unclear, but likely at baseline. Pt with difficulty stating how many fingers PT holding up without multiple attempts.)       Perception     Praxis      Pertinent Vitals/Pain Pain Assessment Pain Assessment: Faces Faces Pain Scale: Hurts even more Pain Location: unable to state, but likely R and L groin area where surgery sites are. Pain Descriptors / Indicators: Grimacing, Guarding, Moaning Pain Intervention(s): Limited activity within patient's tolerance, Monitored during session      Hand Dominance  (Pt's R hand is contracted and with limited ROM at R shoulder; pt using LUE for functional tasks such as reaching for bed rail and eating during session.)   Extremity/Trunk Assessment Upper Extremity Assessment Upper Extremity Assessment: RUE deficits/detail RUE Deficits / Details: able to flex/extend R elbow, although tone noted and pt often holding R arm in flexed elbow position with activity. No meaningful movement at R shoulder noted. R finger flexion noted.   Lower Extremity Assessment Lower Extremity Assessment: Generalized weakness;Defer to PT evaluation       Communication Communication Communication: Expressive difficulties;Receptive difficulties (Pt has hx of CVA, so perhaps expressive/receptive aphasia? Lots of difficulty providing meaningful information about prior level of function details, such as how he transfers or his level of ADL assist.)   Cognition Arousal/Alertness: Awake/alert Behavior During Therapy: Flat affect Overall Cognitive Status: Difficult to assess                                 General Comments: Pt answered "yes" "no" to almost all questions. unable to provide PLOF, oriented to name. delayed processing noted. when asked what month it is, pt unable to answer; when asked if it is summer or winter, pt answered winter.     General Comments  Pt on room air throughout session; O2 in mid 90's. BP taken in R upper arm (as IV inserted and running in LUE). BP in semi-reclined prior to mobility 103/72. BP at EOB 60/41; quickly returned to supine and BP taken again 93/47. HOB placed in extreme upright position, BP then 107/68. T/f again to EOB and BP 89/61. Again at EOB 79/61 a few minutes later. Pt not able to provide much feedback on whether he is feeling dizzy or lightheaded; was grimacing.    Exercises     Shoulder Instructions      Home Living Family/patient expects to be discharged to:: Private residence Living  Arrangements: Other relatives (lives with sister [name], per pt) Available Help at Discharge: Family Type of Home:  (unknown)                           Additional Comments: Pt states he uses a w/c and lives with sister [name]. Is unable to provide any further meaningful information about prior level of function.      Prior Functioning/Environment Prior Level of Function : Patient poor historian/Family not available             Mobility Comments: per pt he uses a WC, but unable to describe how he gets to the [facility] ADLs Comments: Unclear what prior ADL function was.        OT Problem List: Decreased range of motion;Decreased strength;Decreased activity tolerance;Impaired balance (sitting and/or standing);Decreased cognition      OT Treatment/Interventions: Self-care/ADL training;Therapeutic exercise;Therapeutic activities    OT Goals(Current goals can  be found in the care plan section) Acute Rehab OT Goals Patient Stated Goal: unable to state OT Goal Formulation: Patient unable to participate in goal setting Time For Goal Achievement: 11/19/22 Potential to Achieve Goals: Fair ADL Goals Pt Will Perform Eating: sitting;with supervision Pt Will Perform Grooming: with min assist;sitting Pt Will Transfer to Toilet: with min assist;bedside commode  OT Frequency: Min 1X/week    Co-evaluation PT/OT/SLP Co-Evaluation/Treatment: Yes Reason for Co-Treatment: Complexity of the patient's impairments (multi-system involvement);For patient/therapist safety;To address functional/ADL transfers PT goals addressed during session: Mobility/safety with mobility;Balance OT goals addressed during session: ADL's and self-care;Strengthening/ROM      AM-PAC OT "6 Clicks" Daily Activity     Outcome Measure Help from another person eating meals?: A Little Help from another person taking care of personal grooming?: A Lot Help from another person toileting, which includes using toliet, bedpan,  or urinal?: Total Help from another person bathing (including washing, rinsing, drying)?: Total Help from another person to put on and taking off regular upper body clothing?: A Lot Help from another person to put on and taking off regular lower body clothing?: Total 6 Click Score: 10   End of Session Nurse Communication: Mobility status  Activity Tolerance: Treatment limited secondary to medical complications (Comment) (BP dropping at EOB) Patient left: in bed;with bed alarm set;with nursing/sitter in room (pt set up for eating lunch)  OT Visit Diagnosis: Other abnormalities of gait and mobility (R26.89);Muscle weakness (generalized) (M62.81)                Time: 9563-8756 OT Time Calculation (min): 35 min Charges:  OT General Charges $OT Visit: 1 Visit OT Evaluation $OT Eval Moderate Complexity: 1 Mod  Godson Pollan Junie Panning, MS, OTR/L  Alvester Morin 11/05/2022, 4:11 PM

## 2022-11-06 ENCOUNTER — Encounter
Admission: EM | Disposition: A | Discharge status: Skilled Nursing Facility | Payer: Medicare Other | Source: Home / Self Care | Attending: Osteopathic Medicine

## 2022-11-06 ENCOUNTER — Inpatient Hospital Stay: Payer: Medicare Other

## 2022-11-06 ENCOUNTER — Inpatient Hospital Stay: Payer: Medicare Other | Admitting: Certified Registered Nurse Anesthetist

## 2022-11-06 ENCOUNTER — Other Ambulatory Visit: Payer: Self-pay

## 2022-11-06 DIAGNOSIS — R9431 Abnormal electrocardiogram [ECG] [EKG]: Secondary | ICD-10-CM | POA: Diagnosis not present

## 2022-11-06 DIAGNOSIS — F1010 Alcohol abuse, uncomplicated: Secondary | ICD-10-CM | POA: Diagnosis not present

## 2022-11-06 DIAGNOSIS — I97630 Postprocedural hematoma of a circulatory system organ or structure following a cardiac catheterization: Secondary | ICD-10-CM

## 2022-11-06 DIAGNOSIS — I998 Other disorder of circulatory system: Secondary | ICD-10-CM | POA: Diagnosis not present

## 2022-11-06 DIAGNOSIS — W19XXXA Unspecified fall, initial encounter: Secondary | ICD-10-CM | POA: Diagnosis not present

## 2022-11-06 HISTORY — PX: HEMATOMA EVACUATION: SHX5118

## 2022-11-06 LAB — TYPE AND SCREEN
ABO/RH(D): A NEG
Unit division: 0

## 2022-11-06 LAB — HEMOGLOBIN AND HEMATOCRIT, BLOOD
HCT: 26.1 % — ABNORMAL LOW (ref 39.0–52.0)
Hemoglobin: 8.6 g/dL — ABNORMAL LOW (ref 13.0–17.0)

## 2022-11-06 LAB — BASIC METABOLIC PANEL
Anion gap: 10 (ref 5–15)
BUN: 12 mg/dL (ref 8–23)
CO2: 23 mmol/L (ref 22–32)
Calcium: 7.8 mg/dL — ABNORMAL LOW (ref 8.9–10.3)
Chloride: 105 mmol/L (ref 98–111)
Creatinine, Ser: 1.02 mg/dL (ref 0.61–1.24)
GFR, Estimated: >60 mL/min (ref >60)
Glucose, Bld: 104 mg/dL — ABNORMAL HIGH (ref 70–99)
Potassium: 3.3 mmol/L — ABNORMAL LOW (ref 3.5–5.1)
Sodium: 138 mmol/L (ref 135–145)

## 2022-11-06 LAB — CBC
HCT: 25.5 % — ABNORMAL LOW (ref 39.0–52.0)
Hemoglobin: 8.2 g/dL — ABNORMAL LOW (ref 13.0–17.0)
MCH: 29.3 pg (ref 26.0–34.0)
MCHC: 32.2 g/dL (ref 30.0–36.0)
MCV: 91.1 fL (ref 80.0–100.0)
Platelets: 177 10*3/uL (ref 150–400)
RBC: 2.8 MIL/uL — ABNORMAL LOW (ref 4.22–5.81)
RDW: 14.7 % (ref 11.5–15.5)
WBC: 13.8 10*3/uL — ABNORMAL HIGH (ref 4.0–10.5)
nRBC: 0 % (ref 0.0–0.2)

## 2022-11-06 LAB — BPAM RBC: ISSUE DATE / TIME: 202406160851

## 2022-11-06 LAB — CULTURE, BLOOD (ROUTINE X 2): Culture: NO GROWTH

## 2022-11-06 LAB — HEPARIN LEVEL (UNFRACTIONATED): Heparin Unfractionated: 0.31 IU/mL (ref 0.30–0.70)

## 2022-11-06 LAB — CK: Total CK: 4484 U/L — ABNORMAL HIGH (ref 49–397)

## 2022-11-06 LAB — PREPARE RBC (CROSSMATCH)

## 2022-11-06 LAB — ABO/RH: ABO/RH(D): A NEG

## 2022-11-06 SURGERY — EVACUATION HEMATOMA
Anesthesia: General | Laterality: Left

## 2022-11-06 MED ORDER — PHENYLEPHRINE 80 MCG/ML (10ML) SYRINGE FOR IV PUSH (FOR BLOOD PRESSURE SUPPORT)
PREFILLED_SYRINGE | INTRAVENOUS | Status: DC | PRN
  Administered 2022-11-06 (×2): 160 ug via INTRAVENOUS
  Administered 2022-11-06: 80 ug via INTRAVENOUS
  Administered 2022-11-06: 160 ug via INTRAVENOUS

## 2022-11-06 MED ORDER — SODIUM CHLORIDE 0.9% IV SOLUTION: Freq: Once | INTRAVENOUS | Status: AC

## 2022-11-06 MED ORDER — FENTANYL CITRATE (PF) 100 MCG/2ML IJ SOLN
INTRAMUSCULAR | Status: DC | PRN
  Administered 2022-11-06 (×2): 50 ug via INTRAVENOUS

## 2022-11-06 MED ORDER — DEXAMETHASONE SODIUM PHOSPHATE 10 MG/ML IJ SOLN
INTRAMUSCULAR | Status: DC | PRN
  Administered 2022-11-06: 10 mg via INTRAVENOUS

## 2022-11-06 MED ORDER — LIDOCAINE HCL (CARDIAC) PF 100 MG/5ML IV SOSY
PREFILLED_SYRINGE | INTRAVENOUS | Status: DC | PRN
  Administered 2022-11-06: 60 mg via INTRAVENOUS

## 2022-11-06 MED ORDER — EPHEDRINE SULFATE (PRESSORS) 50 MG/ML IJ SOLN
INTRAMUSCULAR | Status: DC | PRN
  Administered 2022-11-06 (×3): 5 mg via INTRAVENOUS

## 2022-11-06 MED ORDER — SODIUM CHLORIDE 0.9 % IR SOLN
Status: DC | PRN
  Administered 2022-11-06: 500 mL via SURGICAL_CAVITY

## 2022-11-06 MED ORDER — 0.9 % SODIUM CHLORIDE (POUR BTL) OPTIME
TOPICAL | Status: DC | PRN
  Administered 2022-11-06: 1000 mL

## 2022-11-06 MED ORDER — IOHEXOL 350 MG/ML SOLN
100.0000 mL | Freq: Once | INTRAVENOUS | Status: AC | PRN
  Administered 2022-11-06: 100 mL via INTRAVENOUS

## 2022-11-06 MED ORDER — FENTANYL CITRATE (PF) 100 MCG/2ML IJ SOLN
INTRAMUSCULAR | Status: AC
  Filled 2022-11-06: qty 2

## 2022-11-06 MED ORDER — MIDAZOLAM HCL 2 MG/2ML IJ SOLN
INTRAMUSCULAR | Status: AC
  Filled 2022-11-06: qty 2

## 2022-11-06 MED ORDER — ONDANSETRON HCL 4 MG/2ML IJ SOLN
INTRAMUSCULAR | Status: DC | PRN
  Administered 2022-11-06: 4 mg via INTRAVENOUS

## 2022-11-06 MED ORDER — PROPOFOL 10 MG/ML IV BOLUS
INTRAVENOUS | Status: AC
  Filled 2022-11-06: qty 20

## 2022-11-06 MED ORDER — PROPOFOL 10 MG/ML IV BOLUS
INTRAVENOUS | Status: DC | PRN
  Administered 2022-11-06: 30 mg via INTRAVENOUS
  Administered 2022-11-06: 100 mg via INTRAVENOUS

## 2022-11-06 MED ORDER — ORAL CARE MOUTH RINSE: 15.0000 mL | OROMUCOSAL | Status: DC | PRN

## 2022-11-06 SURGICAL SUPPLY — 79 items
ADH SKN CLS APL DERMABOND .7 (GAUZE/BANDAGES/DRESSINGS)
APL PRP STRL LF DISP 70% ISPRP (MISCELLANEOUS) ×1
APPLIER CLIP 11 MED OPEN (CLIP)
APPLIER CLIP 9.375 SM OPEN (CLIP)
APR CLP MED 11 20 MLT OPN (CLIP)
APR CLP SM 9.3 20 MLT OPN (CLIP)
BAG DECANTER FOR FLEXI CONT (MISCELLANEOUS) ×1 IMPLANT
BAG ISL LRG 20X20 DRWSTRG (DRAPES)
BAG ISOLATATION DRAPE 20X20 ST (DRAPES) IMPLANT
BLADE SURG 15 STRL LF DISP TIS (BLADE) ×1 IMPLANT
BLADE SURG 15 STRL SS (BLADE) ×1
BLADE SURG SZ11 CARB STEEL (BLADE) ×1 IMPLANT
BOOT SUTURE VASCULAR YLW (MISCELLANEOUS)
BRUSH SCRUB EZ 4% CHG (MISCELLANEOUS) ×1 IMPLANT
BULB RESERV EVAC DRAIN JP 100C (MISCELLANEOUS) IMPLANT
CHLORAPREP W/TINT 26 (MISCELLANEOUS) ×1 IMPLANT
CLAMP SUTURE YELLOW 5 PAIRS (MISCELLANEOUS) ×1 IMPLANT
CLIP APPLIE 11 MED OPEN (CLIP) IMPLANT
CLIP APPLIE 9.375 SM OPEN (CLIP) IMPLANT
DERMABOND ADVANCED .7 DNX12 (GAUZE/BANDAGES/DRESSINGS) IMPLANT
DRAIN CHANNEL JP 19F (MISCELLANEOUS) IMPLANT
DRAPE INCISE IOBAN 66X45 STRL (DRAPES) ×1 IMPLANT
DRESSING SURGICEL FIBRLLR 1X2 (HEMOSTASIS) ×1 IMPLANT
DRSG OPSITE POSTOP 4X6 (GAUZE/BANDAGES/DRESSINGS) IMPLANT
DRSG SURGICEL FIBRILLAR 1X2 (HEMOSTASIS)
DRSG TEGADERM 4X4.75 (GAUZE/BANDAGES/DRESSINGS) IMPLANT
ELECT CAUTERY BLADE 6.4 (BLADE) ×1 IMPLANT
ELECT REM PT RETURN 9FT ADLT (ELECTROSURGICAL) ×1
ELECTRODE REM PT RTRN 9FT ADLT (ELECTROSURGICAL) ×1 IMPLANT
GAUZE 4X4 16PLY ~~LOC~~+RFID DBL (SPONGE) ×1 IMPLANT
GLOVE BIO SURGEON STRL SZ7 (GLOVE) ×3 IMPLANT
GLOVE BIO SURGEON STRL SZ8 (GLOVE) IMPLANT
GOWN STRL REUS W/ TWL LRG LVL3 (GOWN DISPOSABLE) ×2 IMPLANT
GOWN STRL REUS W/ TWL XL LVL3 (GOWN DISPOSABLE) ×2 IMPLANT
GOWN STRL REUS W/TWL LRG LVL3 (GOWN DISPOSABLE)
GOWN STRL REUS W/TWL XL LVL3 (GOWN DISPOSABLE) ×1
IV NS 500ML (IV SOLUTION) ×1
IV NS 500ML BAXH (IV SOLUTION) ×1 IMPLANT
KIT PREVENA INCISION MGT 13 (CANNISTER) IMPLANT
KIT TURNOVER KIT A (KITS) ×1 IMPLANT
LABEL OR SOLS (LABEL) ×1 IMPLANT
LOOP VESSEL MAXI 1X406 RED (MISCELLANEOUS) ×2 IMPLANT
LOOP VESSEL MINI 0.8X406 BLUE (MISCELLANEOUS) ×2 IMPLANT
MANIFOLD NEPTUNE II (INSTRUMENTS) ×1 IMPLANT
NDL SAFETY ECLIP 18X1.5 (MISCELLANEOUS) ×1 IMPLANT
NS IRRIG 500ML POUR BTL (IV SOLUTION) ×1 IMPLANT
PACK BASIN MAJOR ARMC (MISCELLANEOUS) ×1 IMPLANT
PACK UNIVERSAL (MISCELLANEOUS) ×1 IMPLANT
RETRACTOR TRAXI PANNICULUS (MISCELLANEOUS) IMPLANT
SET WALTER ACTIVATION W/DRAPE (SET/KITS/TRAYS/PACK) ×1 IMPLANT
SPONGE DRAIN TRACH 4X4 STRL 2S (GAUZE/BANDAGES/DRESSINGS) IMPLANT
SPONGE T-LAP 18X18 ~~LOC~~+RFID (SPONGE) ×2 IMPLANT
STAPLER SKIN PROX 35W (STAPLE) ×1 IMPLANT
SUT ETHILON 3-0 FS-10 30 BLK (SUTURE) ×2
SUT MNCRL 4-0 (SUTURE)
SUT MNCRL 4-0 27XMFL (SUTURE)
SUT PROLENE 5 0 RB 1 DA (SUTURE) ×2 IMPLANT
SUT PROLENE 6 0 BV (SUTURE) ×4 IMPLANT
SUT PROLENE 7 0 BV 1 (SUTURE) ×2 IMPLANT
SUT SILK 2 0 (SUTURE)
SUT SILK 2-0 18XBRD TIE 12 (SUTURE) ×1 IMPLANT
SUT SILK 3 0 (SUTURE)
SUT SILK 3-0 18XBRD TIE 12 (SUTURE) ×1 IMPLANT
SUT SILK 4 0 (SUTURE)
SUT SILK 4-0 18XBRD TIE 12 (SUTURE) ×1 IMPLANT
SUT VIC AB 2-0 CT1 27 (SUTURE)
SUT VIC AB 2-0 CT1 36 (SUTURE) IMPLANT
SUT VIC AB 2-0 CT1 TAPERPNT 27 (SUTURE) ×2 IMPLANT
SUT VIC AB 3-0 SH 27 (SUTURE) ×1
SUT VIC AB 3-0 SH 27X BRD (SUTURE) ×1 IMPLANT
SUT VICRYL+ 3-0 36IN CT-1 (SUTURE) ×2 IMPLANT
SUTURE EHLN 3-0 FS-10 30 BLK (SUTURE) IMPLANT
SUTURE MNCRL 4-0 27XMF (SUTURE) IMPLANT
SYR 20ML LL LF (SYRINGE) ×1 IMPLANT
SYR 5ML LL (SYRINGE) ×1 IMPLANT
TAG SUTURE CLAMP YLW 5PR (MISCELLANEOUS)
TRAP FLUID SMOKE EVACUATOR (MISCELLANEOUS) ×1 IMPLANT
TRAY FOLEY MTR SLVR 16FR STAT (SET/KITS/TRAYS/PACK) ×1 IMPLANT
WATER STERILE IRR 500ML POUR (IV SOLUTION) ×1 IMPLANT

## 2022-11-06 NOTE — Progress Notes (Signed)
Patient remains tachycardic after blood transfusion. Reviewed scan with reading radiologist. A small wisp of contrast is seen from the SFA. I suspect this is a pseudoaneurysm. Will plan to return to OR for hematoma evacuation and pseudoaneurysm repair. Discussed with family who is amenable and provided consent.   Rande Brunt. Lenell Antu, MD [facility] Phone Number: [phone] 11/06/2022 11:19 AM

## 2022-11-06 NOTE — Progress Notes (Signed)
ANTICOAGULATION CONSULT NOTE  Pharmacy Consult for Heparin Infusion Indication: DVT  Patient Measurements: Height: 5\' 7"  (170.2 cm) Weight: 71.2 kg (156 lb 15.5 oz) (weight verified by RN as wwll, 1 top sheet, 1 blanket, 1 chux pad, 1 pillow) IBW/kg (Calculated) : 66.1 Heparin Dosing Weight: 72.9 kg  Labs: Recent Labs    11/04/22 0643 11/05/22 0119 11/05/22 0813 11/06/22 0511  HGB 12.7* 10.3*  --  8.2*  HCT 38.6* 31.5*  --  25.5*  PLT 161 163  --  177  APTT 71*  --   --   --   LABPROT 14.1  --   --   --   INR 1.1  --   --   --   HEPARINUNFRC 0.37 0.33 0.32 0.31  CREATININE 0.87 0.95  --  1.02    Estimated Creatinine Clearance: 65.7 mL/min (by C-G formula based on SCr of 1.02 mg/dL).  Medical History: Past Medical History:  Diagnosis Date   Alcohol use disorder, mild, abuse    COPD (chronic obstructive pulmonary disease) (HCC)    Gout    Hyperlipidemia    Hypertension    Hypothyroid    Peripheral vascular complication    Stroke [facility])     Assessment: Pt is a 67 yo male presenting to ED for fall and c/o left leg pain, found with multiple arterial occlusions in lower extremities.Pt w/ med hx of Eliquis in the past, but stated he has not been taking medication recently.  Date Time HL 6/12 1315  >1.1 6/12 2240 0.98, supratherapeutic 6/13 0545 0.82, supratherapeutic 6/13 1255 0.68, therapeutic x 1 6/13 1909 0.62, therapeutic x 2 6/14 0643 0.37, therapeutic 6/15 0119 0.33, therapeutic x 4 (after restart) 6/16 0511 0.31, therapeutic x 5  Goal of Therapy:  Heparin level 0.3-0.7 units/ml Monitor platelets by anticoagulation protocol: Yes   Plan: s/p artery endarectomies, and angioplasty with stents on 11/04/22 for PAD. Also with DVT. No intervention planned for DVT.  --Continue heparin infusion at 700 units/hr --Recheck HL daily w/ AM labs while therapeutic --CBC daily  Otelia Sergeant, PharmD, [facility] 11/06/2022 5:51 AM

## 2022-11-06 NOTE — Anesthesia Preprocedure Evaluation (Signed)
Anesthesia Evaluation  Patient identified by MRN, date of birth, ID band Patient confused    Reviewed: Allergy & Precautions, NPO status , Patient's Chart, lab work & pertinent test results  History of Anesthesia Complications Negative for: history of anesthetic complications  Airway Mallampati: III  TM Distance: >3 FB Neck ROM: full    Dental  (+) Poor Dentition   Pulmonary COPD, Current Smoker   Pulmonary exam normal        Cardiovascular hypertension, On Medications + CAD and + Peripheral Vascular Disease  Normal cardiovascular exam  EKG 10/2022 Atrial fibrillation Right bundle branch block ST elevation, consider inferior injury Artifact in lead(s) I II aVR aVL V1   Neuro/Psych AMS CVA (R sided deficits), Residual Symptoms  negative psych ROS   GI/Hepatic negative GI ROS,,,(+)     substance abuse  alcohol use  Endo/Other  Hypothyroidism    Renal/GU Rhabdo      Musculoskeletal   Abdominal   Peds  Hematology negative hematology ROS (+) DVT on heparin    Anesthesia Other Findings Past Medical History: No date: Alcohol use disorder, mild, abuse No date: COPD (chronic obstructive pulmonary disease) (HCC) No date: Gout No date: Hyperlipidemia No date: Hypertension No date: Hypothyroid No date: Peripheral vascular complication No date: Stroke [facility])  Past Surgical History: 08/25/2020: LOWER EXTREMITY ANGIOGRAPHY; Right     Comment:  Procedure: LOWER EXTREMITY ANGIOGRAPHY;  Surgeon:               Renford Dills, MD;  Location: [facility] INVASIVE CV LAB;               Service: Cardiovascular;  Laterality: Right; 06/15/2021: LOWER EXTREMITY ANGIOGRAPHY; Left     Comment:  Procedure: LOWER EXTREMITY ANGIOGRAPHY;  Surgeon:               Renford Dills, MD;  Location: [facility] INVASIVE CV LAB;               Service: Cardiovascular;  Laterality: Left; 11/02/2022: LOWER EXTREMITY ANGIOGRAPHY; Left     Comment:   Procedure: Lower Extremity Angiography;  Surgeon: Annice Needy, MD;  Location: [facility] INVASIVE CV LAB;  Service:               Cardiovascular;  Laterality: Left;  BMI    Body Mass Index: 24.58 kg/m      Reproductive/Obstetrics negative OB ROS                             Anesthesia Physical Anesthesia Plan  ASA: 3  Anesthesia Plan: General ETT   Post-op Pain Management: Ofirmev IV (intra-op)*   Induction: Intravenous  PONV Risk Score and Plan: 2 and Ondansetron, Dexamethasone and Treatment may vary due to age or medical condition  Airway Management Planned: Oral ETT  Additional Equipment:   Intra-op Plan:   Post-operative Plan: Extubation in OR  Informed Consent: I have reviewed the patients History and Physical, chart, labs and discussed the procedure including the risks, benefits and alternatives for the proposed anesthesia with the patient or authorized representative who has indicated his/her understanding and acceptance.     Dental Advisory Given  Plan Discussed with: Anesthesiologist, CRNA and Surgeon  Anesthesia Plan Comments: (Patient consented for risks of anesthesia including but not limited to:  - adverse reactions to medications - damage to eyes, teeth,  lips or other oral mucosa - nerve damage due to positioning  - sore throat or hoarseness - Damage to heart, brain, nerves, lungs, other parts of body or loss of life  Patient voiced understanding.)        Anesthesia Quick Evaluation

## 2022-11-06 NOTE — Transfer of Care (Signed)
Immediate Anesthesia Transfer of Care Note  Patient: Farmer Kundinger  Procedure(s) Performed: EVACUATION HEMATOMA (Left)  Patient Location: PACU  Anesthesia Type:General  Level of Consciousness: drowsy  Airway & Oxygen Therapy: Patient Spontanous Breathing and Patient connected to face mask oxygen  Post-op Assessment: Report given to RN and Post -op Vital signs reviewed and stable  Post vital signs: Reviewed and stable  Last Vitals:  Vitals Value Taken Time  BP 121/69   Temp    Pulse 119 11/06/22 1227  Resp 28 11/06/22 1227  SpO2 99 % 11/06/22 1227  Vitals shown include unvalidated device data.  Last Pain:  Vitals:   11/06/22 1105  TempSrc: Oral  PainSc:       Patients Stated Pain Goal: 3 (11/05/22 1945)  Complications: No notable events documented.

## 2022-11-06 NOTE — Anesthesia Procedure Notes (Signed)
Procedure Name: LMA Insertion Date/Time: 11/06/2022 11:44 AM  Performed by: Joanette Gula, Fallan Mccarey, CRNAPre-anesthesia Checklist: Patient identified, Emergency Drugs available, Suction available and Patient being monitored Patient Re-evaluated:Patient Re-evaluated prior to induction Oxygen Delivery Method: Circle system utilized Preoxygenation: Pre-oxygenation with 100% oxygen Induction Type: IV induction Ventilation: Mask ventilation without difficulty LMA: LMA inserted LMA Size: 4.0 Number of attempts: 1 Placement Confirmation: positive ETCO2 and breath sounds checked- equal and bilateral Tube secured with: Tape Dental Injury: Teeth and Oropharynx as per pre-operative assessment

## 2022-11-06 NOTE — Progress Notes (Addendum)
[facility] OF [place] PROGRESS NOTE  ASSESSMENT / PLAN: Corey Olson is a 67 y.o. male status post bilateral femoral endarterectomy and iliac stenting for  rest pain and neurologic changes in the left lower extremity. Neurologic deficit was present before surgery and remains unchanged after revascularization.  PRN pain control Diet as tolerated PT / OT / OOB  Guarded prognosis for left lower extremity; our suspicion is that it was densely ischemic for some time and neurologic deficits may not be recoverable, even with revascularization.  CT angiogram done early this morning personally reviewed in detail. Recommend continued transfusion to support hemoglobin and blood pressure. There is question of pseudoaneurysm in left superficial femoral artery. Will hold heparin. Will recheck CBC after transfusion. If poor response to transfusion will re-explore left groin.  SUBJECTIVE: Comfortable. Not interactive this AM, but awake and alert.  OBJECTIVE: BP (!) 121/104   Pulse (!) 108   Temp 99.9 F (37.7 C) (Axillary)   Resp (!) 25   Ht 5\' 7"  (1.702 m)   Wt 71.2 kg Comment: weight verified by RN as wwll, 1 top sheet, 1 blanket, 1 chux pad, 1 pillow  SpO2 94%   BMI 24.58 kg/m   Intake/Output Summary (Last 24 hours) at 11/06/2022 0752 Last data filed at 11/06/2022 0752 Gross per 24 hour  Intake 3125.39 ml  Output 700 ml  Net 2425.39 ml    Chronically ill man in no distress Mild sinus tachycardia Unlabored breathing Doppler flow in R DP Venous doppler flow in L PT Good doppler flow in R popliteal artery No arterial doppler flow in L pedal arteries No motor or sensory function in the left foot/ankle     Latest Ref Rng & Units 11/06/2022    5:11 AM 11/05/2022    1:19 AM 11/04/2022    6:43 AM  CBC  WBC 4.0 - 10.5 K/uL 13.8  14.4  12.2   Hemoglobin 13.0 - 17.0 g/dL 8.2  16.1  09.6   Hematocrit 39.0 - 52.0 % 25.5  31.5  38.6   Platelets 150 - 400 K/uL 177  163  161          Latest Ref Rng & Units 11/06/2022    5:11 AM 11/05/2022    1:19 AM 11/04/2022    6:43 AM  CMP  Glucose 70 - 99 mg/dL 045  409  811   BUN 8 - 23 mg/dL 12  11  9    Creatinine 0.61 - 1.24 mg/dL 9.14  7.82  9.56   Sodium 135 - 145 mmol/L 138  138  138   Potassium 3.5 - 5.1 mmol/L 3.3  3.5  3.0   Chloride 98 - 111 mmol/L 105  106  103   CO2 22 - 32 mmol/L 23  22  25    Calcium 8.9 - 10.3 mg/dL 7.8  7.8  8.5   Total Protein 6.5 - 8.1 g/dL  6.2  6.7   Total Bilirubin 0.3 - 1.2 mg/dL  1.3  0.7   Alkaline Phos 38 - 126 U/L  38  49   AST 15 - 41 U/L  243  426   ALT 0 - 44 U/L  58  80     Estimated Creatinine Clearance: 65.7 mL/min (by C-G formula based on SCr of 1.02 mg/dL).  CT angiogram reviewed in detail. Formal read pending. By my review - no evidence of active bleeding, pseudoaneurysm, etc. That would require return to OR.   Rande Brunt.  Lenell Antu, MD [facility] Phone Number: [phone] 11/06/2022 7:52 AM

## 2022-11-06 NOTE — Progress Notes (Signed)
       CROSS COVER NOTE  NAME: Corey Olson MRN: [id number] DOB : [birth year]    Concern as stated by nurse / staff    Hemoglobin drop over 2 days 12.7 - 8.2. bleeding at left femoral site   Pertinent findings on chart review:   Assessment and  Interventions   Assessment: Moder alt bleeding left groin  Left thigh significantly (2-3 xs) larger than right and hard Pedal pulse  Plan: Dr Lenell Antu with vascular surgery informed CTA left leg stat Stop heparin  Transfuse 1 unit blood       Donnie Mesa NP [facility] Cross Cover 7pm-7am - check amion for availability Pager [phone]

## 2022-11-06 NOTE — Op Note (Signed)
DATE OF SERVICE: 11/06/2022  PATIENT:  Corey Olson  67 y.o. male  PRE-OPERATIVE DIAGNOSIS:  left groin hematoma, possible pseudoaneurysm at femoral endarterectomy patch angioplasty  POST-OPERATIVE DIAGNOSIS:  postoperative hematoma, small venous branch bleeding  PROCEDURE:   1) evacuation of left groin hematoma 2) direct repair of venous branch bleeding  SURGEON:  Surgeon(s) and Role:    * Leonie Douglas, MD - Primary  ASSISTANT: Debera Lat  ANESTHESIA:   general  EBL: minimal from case - about 200cc hematoma evacuated  BLOOD ADMINISTERED:none  DRAINS: Penrose drain in the groin    LOCAL MEDICATIONS USED:  NONE  SPECIMEN:  none  COUNTS: confirmed correct.  TOURNIQUET:  none  PATIENT DISPOSITION:  PACU - hemodynamically stable.   Delay start of Pharmacological VTE agent (>24hrs) due to surgical blood loss or risk of bleeding: no  INDICATION FOR PROCEDURE: Tahaj Staker is a 67 y.o. male who underwent bilateral common femoral endarterectomy and iliac stenting on Friday, 11/04/2022.  Patient developed significant swelling in the left thigh overnight.  His hemoglobin was noted to drop to 8.2.  A CT angiogram was performed and read as concerning for possible pseudoaneurysm.  I elected to explore the groin. After careful discussion of risks, benefits, and alternatives the patient was offered wound exploration. The patient's family understood and wished to proceed.  OPERATIVE FINDINGS: Hematoma throughout the groin.  This was evacuated.  A venous branch was identified as actively bleeding and ligated with Prolene suture.  The patch was provoked extensively and no active bleeding was noted.  A small branch off of the proximal SFA was noted traveling medially and the thigh; this was likely the structure that appeared to be a pseudoaneurysm on CT angiogram.  DESCRIPTION OF PROCEDURE: After identification of the patient in the pre-operative holding area, the patient was transferred  to the operating room. The patient was positioned supine on the operating room table. Anesthesia was induced. The groin was prepped and draped in standard fashion. A surgical pause was performed confirming correct patient, procedure, and operative location.  The existing incision in the left groin was opened sharply.  About 200 cc of hematoma was encountered.  This was evacuated.  I removed all suture material and identified the repaired femoral arteries.  I identified the takeoff of the superficial femoral artery and the profunda femoris artery.  Both appeared healthy and intact.  There is a small branch traveling from the SFA medially which was likely the finding on the CT angiogram suspicious for pseudoaneurysm.  A small venous branch was actively bleeding.  This was ligated with Prolene.  I provoked the patch extensively and was not able to provoke any bleeding.  Satisfied I ended the case here.  The wound was irrigated.  The wound was closed in layers using 2-0 Vicryl, 3-0 Vicryl, skin stapler.  A 19 [place] JP drain was placed into the wound and exited through the anterior thigh.  This was secured to the skin with a nylon stitch.  Upon completion of the case instrument and sharps counts were confirmed correct. The patient was transferred to the  PACU in good condition. I was present for all portions of the procedure.  Rande Brunt. Lenell Antu, MD [facility] Specialists of [facility] Phone Number: [phone] 11/06/2022 12:27 PM

## 2022-11-06 NOTE — Anesthesia Postprocedure Evaluation (Signed)
Anesthesia Post Note  Patient: Corey Olson  Procedure(s) Performed: EVACUATION HEMATOMA (Left)  Patient location during evaluation: PACU Anesthesia Type: General Level of consciousness: awake and alert Pain management: pain level controlled Vital Signs Assessment: post-procedure vital signs reviewed and stable Respiratory status: spontaneous breathing, nonlabored ventilation, respiratory function stable and patient connected to nasal cannula oxygen Cardiovascular status: blood pressure returned to baseline and stable Postop Assessment: no apparent nausea or vomiting Anesthetic complications: no   No notable events documented.   Last Vitals:  Vitals:   11/06/22 1400 11/06/22 1500  BP: 125/64 (!) 135/58  Pulse: 93 84  Resp: (!) 29 (!) 23  Temp:    SpO2: 92% 95%    Last Pain:  Vitals:   11/06/22 1315  TempSrc:   PainSc: 0-No pain                 Louie Boston

## 2022-11-06 NOTE — Progress Notes (Signed)
PROGRESS NOTE    Corey Olson   UJW:[id number] DOB: 1955-06-27  DOA: 11/01/2022 Date of Service: 11/06/22 PCP: [name], MD     Brief Narrative / Hospital Course:  Corey Olson is a 67 y.o. male with a history of stroke, peripheral atrial disease who presents with altered mental status, left leg weakness.  Family spoke to him at 430 and felt that he was confused.  Prior to that he had not been spoken to since the night before when he had seemed normal.  Patient reports left leg weakness dragging his left foot while walking and decreased sensation, all of which were new but without clear timelines. 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  06/11: in ED, VSS, code stroke called Per neurology - recs MRI brain, no TPN. Admitted to hospitalist service. Elevated AST/ALT c/w EtOH but EtOH levels <10 and RUQ [place] (+)hepatic steatosis, , Lactic acid ok at 1.3, CBC no concerns, elevated D-Dimer, CK >14,000 and UA no UTI but (+)Hgb no significant RBC c/w rhabdo, EtOH <10, UDS neg. [place] bilateral LE (+)DVT L femoral and popliteal V.  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 Found to have weakneed pulses LLE and CTA runoff performed showing ischemia/severe PAD LLE, on exam LLE is cold below mid-tibia so vascular consulted, continue heparin gtt, taken to cath lab for RLE and LLE angiogram. Significant vascular disease, limb likely not salvageable per Dr Wyn Quaker - prognosis poor for even AKA healing, will likely need extensive endarterectomy/stenting to even salvage an AKA. Can plan for surgery later this week, will need cardiac clearance  06/13: anticipate surgery tomorrow  06/14: endarterectomy/stents bilateral femoral  06/15: recovering in ICU. Continue heparin infusion. Has been in sinus tach w/ elevated temp, (+)SIRS, (+)UA concern for sepsis d/t UTI vs SIRS w/  other medical issues and also eval for PE was neg.  06/16: swelling in extremity, received 1 unit PRBC, CT LLE (+)hematoma/pseudoaneurysm, to OR w/ Dr Lenell Antu for evacuation hematoma and repair venous branch bleed.   Consultants:  Neurology Vascular surgery  Cardiology   Procedures: 11/02/22 Bilateral LE angiogram w/ Dr Wyn Quaker  11/04/22 endarterectomy/stents bilateral femoral w/ Dr Wyn Quaker 11/06/22  evacuation of left groin hematoma and direct repair of venous branch bleeding w/ Dr Lenell Antu       ASSESSMENT & PLAN:   Principal Problem:   Fall Active Problems:   Fall at home, initial encounter   Tobacco abuse   PAD (peripheral artery disease) (HCC)   Essential hypertension   Alcohol abuse   RUE weakness   Non-traumatic rhabdomyolysis   Positive D dimer   Abnormal EKG   LLE w/ severe PAD, ischemic L foot w/ neurological deficits likely cause for fall S/p angiograpy and endarterectomy/stenting  Vascular surgery following Likely to need L AKA   SIRS - improving  (+)SIRS, w/ tachycardia, tachypnea, elevated WBC CXR --> no apparent pneumonia  UA --> UTI, see below  BCx, UCx Also eval for PE w/ CTA given high risk, in case needing thrombectomy   ABLA Postoperative hematoma - evacuated  Monitor CBC/HH closely Transfusion as needed  UTI question sepsis d/t UTI vs SIRS d/t other see above  Ceftriaxone initiated 11/05/22  Await cultures   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 HTN HLD Previous Lexiscan w/o ischemia Per cardiology note 02/2021 "high risk, if he  develops chest pain with exertion or dyspnea, will schedule left heart cath." Continuing on heparin infusion per vascular team  We were holding statin given transaminitis but risk of holding may be greater than risk to liver, continue statin and will monitor LFT's closely  DVT L femoral and popliteal V  PE ruled out  Heparin gtt Vascular surgery following but no intervention needed at this time for the DVT     Rhabdomyolysis w/ normal renal function  IV fluids Follow renal function and CK   History CVA w/ residual R sided deficits (+)subacute R cerebellar punctate infarct on MRI ASA We were holding statin given transaminitis but risk of holding may be greater than risk to liver, will monitor LFT's closely Neurology following  On heparin gtt for DVT and ischemic leg  Transaminitis  Hepatic Steatosis on RUQ [place] EtOH use but no acute intoxication based on blood levels - question alcoholic hepatitis  Follow CMP Hepatitis panel pending  We were holding statin given transaminitis but risk of holding may be greater than risk to liver, will monitor LFT's closely  Weakness and fall Confusion / AMS question toxic metabolic encephalopathy or CVA Likely multifactorial Pressing concern for ischemic LLE as above Concern for CVA w/ MRI brain pending Potential EtOH withdrawal  Treat multiple underlying issues  PT/OT to eval when more stable  Fall precaution   EtOH use but no acute intoxication based on blood levels  CIWA protocol  Thiamine  Hypokalemia Replace as needed Monitor BMP  Essential HTN Permissive HTN in light of CVA - can begin to lower BP goal   Tobacco abuse Nicotine patch     DVT: treating w/ heparin  Pertinent IV fluids/nutrition: NS 100 mL/h Central lines / invasive devices: none  Code Status: FULL CODE ACP documentation reviewed: 11/02/22 none on file   Current Admission Status: inpatient   TOC needs / Dispo plan: TBD expect will need SNF Barriers to discharge / significant pending items: possible amputation              Subjective / Brief ROS:  Patient examined in ICU Alert Denies chest pain, SOB or palpitations Alert, no concerns  (+)pain LLE   Family Communication: none at this time    Objective Findings:  Vitals:   11/06/22 1310 11/06/22 1315 11/06/22 1400 11/06/22 1500  BP: 136/70  125/64 (!) 135/58  Pulse:  (!) 106 93 84  Resp:   (!) 27 (!) 29 (!) 23  Temp:      TempSrc:      SpO2:  90% 92% 95%  Weight:      Height:        Intake/Output Summary (Last 24 hours) at 11/06/2022 1549 Last data filed at 11/06/2022 1500 Gross per 24 hour  Intake 5467.71 ml  Output 950 ml  Net 4517.71 ml    Filed Weights   11/01/22 1841 11/03/22 0413 11/04/22 0328  Weight: 72.9 kg 80.1 kg 71.2 kg    Examination:  Physical Exam Constitutional:      General: He is not in acute distress. Cardiovascular:     Rate and Rhythm: Regular rhythm. Tachycardia present.  Pulmonary:     Effort: Pulmonary effort is normal. No respiratory distress.     Breath sounds: No rhonchi.  Abdominal:     General: There is no distension.     Palpations: Abdomen is soft.  Musculoskeletal:     Right lower leg: No edema.     Left lower leg: No  edema.  Neurological:     Mental Status: He is alert.  Psychiatric:        Behavior: Behavior normal.          Scheduled Medications:   aspirin EC  81 mg Oral Daily   atorvastatin  80 mg Oral QPM   Chlorhexidine Gluconate Cloth  6 each Topical Daily   metoprolol succinate  50 mg Oral Daily   nicotine  21 mg Transdermal Daily   pantoprazole  40 mg Oral BID   sodium chloride flush  3 mL Intravenous Q12H   thiamine  100 mg Oral Daily    Continuous Infusions:  sodium chloride 100 mL/hr at 11/06/22 1500   cefTRIAXone (ROCEPHIN)  IV 1 g (11/05/22 1616)    PRN Medications:  acetaminophen **OR** acetaminophen, ondansetron (ZOFRAN) IV  Antimicrobials from admission:  Anti-infectives (From admission, onward)    Start     Dose/Rate Route Frequency Ordered Stop   11/05/22 1645  cefTRIAXone (ROCEPHIN) 1 g in sodium chloride 0.9 % 100 mL IVPB        1 g 200 mL/hr over 30 Minutes Intravenous Every 24 hours 11/05/22 1555     11/04/22 0634  ceFAZolin (ANCEF) IVPB 2g/100 mL premix        2 g 200 mL/hr over 30 Minutes Intravenous 30 min pre-op 11/04/22 0634 11/04/22 1251   11/02/22 1431  ceFAZolin  (ANCEF) IVPB 2g/100 mL premix        2 g 200 mL/hr over 30 Minutes Intravenous 30 min pre-op 11/02/22 1431 11/02/22 1653           Data Reviewed:  I have personally reviewed the following...  CBC: Recent Labs  Lab 11/01/22 1843 11/02/22 0625 11/03/22 0545 11/04/22 0643 11/05/22 0119 11/06/22 0511 11/06/22 1317  WBC 9.3 9.7 10.2 12.2* 14.4* 13.8*  --   NEUTROABS 7.5  --   --   --   --   --   --   HGB 14.5 13.5 12.7* 12.7* 10.3* 8.2* 8.6*  HCT 44.9 41.8 38.3* 38.6* 31.5* 25.5* 26.1*  MCV 90.3 90.1 88.5 88.1 89.5 91.1  --   PLT 156 159 148* 161 163 177  --     Basic Metabolic Panel: Recent Labs  Lab 11/01/22 2001 11/02/22 0625 11/03/22 0545 11/04/22 0643 11/05/22 0119 11/06/22 0511  NA  --  139 139 138 138 138  K  --  3.4* 3.0* 3.0* 3.5 3.3*  CL  --  104 106 103 106 105  CO2  --  21* 24 25 22 23   GLUCOSE  --  84 106* 123* 130* 104*  BUN  --  12 11 9 11 12   CREATININE  --  0.98 1.00 0.87 0.95 1.02  CALCIUM  --  7.8* 8.4* 8.5* 7.8* 7.8*  MG 2.0  --   --   --   --   --     GFR: Estimated Creatinine Clearance: 65.7 mL/min (by C-G formula based on SCr of 1.02 mg/dL). Liver Function Tests: Recent Labs  Lab 11/01/22 1843 11/02/22 0625 11/03/22 0545 11/04/22 0643 11/05/22 0119  AST 296* 366* 408* 426* 243*  ALT 63* 74* 75* 80* 58*  ALKPHOS 55 57 49 49 38  BILITOT 1.3* 1.6* 0.9 0.7 1.3*  PROT 7.0 6.6 6.3* 6.7 6.2*  ALBUMIN 4.2 3.7 3.3* 3.5 3.6    Recent Labs  Lab 11/01/22 2001  LIPASE 27    No results for input(s): "AMMONIA" in the last 168  hours. Coagulation Profile: Recent Labs  Lab 11/01/22 1843 11/04/22 0643  INR 1.0 1.1    Cardiac Enzymes: Recent Labs  Lab 11/01/22 2001 11/03/22 0545 11/06/22 0500  CKTOTAL 14,578* 13,747* 4,484*    BNP (last 3 results) No results for input(s): "PROBNP" in the last 8760 hours. HbA1C: Recent Labs    11/04/22 0643  HGBA1C 5.3    CBG: Recent Labs  Lab 11/01/22 1842 11/02/22 0215  11/04/22 1743  GLUCAP 91 84 142*    Lipid Profile: Recent Labs    11/04/22 0643  CHOL 101  HDL 35*  LDLCALC 49  TRIG 83  CHOLHDL 2.9    Thyroid Function Tests: No results for input(s): "TSH", "T4TOTAL", "FREET4", "T3FREE", "THYROIDAB" in the last 72 hours. Anemia Panel: No results for input(s): "VITAMINB12", "FOLATE", "FERRITIN", "TIBC", "IRON", "RETICCTPCT" in the last 72 hours. Most Recent Urinalysis On File:     Component Value Date/Time   COLORURINE YELLOW (A) 11/05/2022 1450   APPEARANCEUR CLOUDY (A) 11/05/2022 1450   LABSPEC 1.029 11/05/2022 1450   PHURINE 5.0 11/05/2022 1450   GLUCOSEU NEGATIVE 11/05/2022 1450   HGBUR LARGE (A) 11/05/2022 1450   BILIRUBINUR NEGATIVE 11/05/2022 1450   KETONESUR 5 (A) 11/05/2022 1450   PROTEINUR 100 (A) 11/05/2022 1450   NITRITE NEGATIVE 11/05/2022 1450   LEUKOCYTESUR MODERATE (A) 11/05/2022 1450   Sepsis Labs: @LABRCNTIP (procalcitonin:4,lacticidven:4) Microbiology: Recent Results (from the past 240 hour(s))  Culture, blood (Routine X 2) w Reflex to ID Panel     Status: None (Preliminary result)   Collection Time: 11/05/22  3:26 PM   Specimen: BLOOD  Result Value Ref Range Status   Specimen Description BLOOD BLOOD RIGHT HAND  Final   Special Requests   Final    BOTTLES DRAWN AEROBIC AND ANAEROBIC Blood Culture adequate volume   Culture   Final    NO GROWTH < 24 HOURS Performed at [facility], [address], [place], [place] [address]    Report Status PENDING  Incomplete  Culture, blood (Routine X 2) w Reflex to ID Panel     Status: None (Preliminary result)   Collection Time: 11/05/22  3:29 PM   Specimen: BLOOD  Result Value Ref Range Status   Specimen Description BLOOD BLOOD LEFT HAND  Final   Special Requests   Final    BOTTLES DRAWN AEROBIC AND ANAEROBIC Blood Culture adequate volume   Culture   Final    NO GROWTH < 24 HOURS Performed at [facility], [address] [place], [place]  [address]    Report Status PENDING  Incomplete      Radiology Studies last 3 days: CT ANGIO AO+BIFEM W & OR WO CONTRAST  Addendum Date: 11/06/2022   ADDENDUM REPORT: 11/06/2022 11:10 ADDENDUM: Laterality error in impression #1,  which should read: 1. 6.8 cm hematoma in the deep subcutaneous tissues overlying the LEFT SFA, with possible pseudoaneurysm. Electronically Signed   By: Corlis Leak M.D.   On: 11/06/2022 11:10   Result Date: 11/06/2022 CLINICAL DATA:  Soft tissue mass deep thigh EXAM: CT ANGIOGRAPHY OF ABDOMINAL AORTA WITH ILIOFEMORAL RUNOFF TECHNIQUE: Multidetector CT imaging of the abdomen, pelvis and lower extremities was performed using the standard protocol during bolus administration of intravenous contrast. Multiplanar CT image reconstructions and MIPs were obtained to evaluate the vascular anatomy. RADIATION DOSE REDUCTION: This exam was performed according to the departmental dose-optimization program which includes automated exposure control, adjustment of the mA and/or kV according to patient size and/or  use of iterative reconstruction technique. CONTRAST:  OMNIPAQUE IOHEXOL 350 MG/ML SOLN COMPARISON:  11/02/2022 FINDINGS: VASCULAR Aorta: Moderate calcified atheromatous plaque. No aneurysm, dissection, or significant stenosis. Eccentric intraluminal thrombus in the infrarenal segment without high-grade stenosis as before, an a embolic risk. Celiac: Common origin with SMA. There is heavy calcified plaque with mild ostial stenosis of doubtful hemodynamic significance. SMA: Common origin with the celiac axis. Mild origin stenosis, and scattered calcified atheromatous plaque distally resulting in tandem areas of stenosis. Renals: Single right, with heavily calcified ostial plaque over length of approximately 1.2 cm resulting in at least mild stenosis, patent distally. Single left, with heavily calcified ostial plaque over length of 1.6 cm resulting in stenosis of at least moderate  severity, atheromatous but patent distally. IMA: Patent, diminutive RIGHT Lower Extremity Inflow: Common iliac heavily calcified plaque. Patent stent in across its bifurcation. Internal iliac origin occlusion External iliac heavily atheromatous but patent with overlapping stents through its length. Outflow: Common femoral patent. Deep femoral branches patent. SFA long segment occlusion from just beyond its origin through its length. Popliteal is atheromatous, with a small patent channel reconstituted by collaterals. Runoff: Heavily calcified proximally, limiting luminal evaluation. There is enhancement of the distal anterior tibial artery crossing the ankle as dorsalis pedis. The peroneal artery is opacified just above the calf. LEFT Lower Extremity Inflow: Common iliac heavily atheromatous but now patent, stented through its length. Internal iliac long segment origin occlusion. External iliac atheromatous, stented and now patent through its length Outflow: Common femoral patent. Deep femoral branches patent. SFA long segment occlusion through its length. Popliteal long segment occlusion from its origin. There is collateral reconstitution of a small patent channel below the knee. Runoff: Scattered calcified atheromatous plaque. Anterior and posterior tibial arteries occlude proximal calf. Peroneal artery is diminutive but appears patent to just above the ankle. Veins: No obvious venous abnormality within the limitations of this arterial phase study. Review of the MIP images confirms the above findings. NON-VASCULAR Lower chest: No pleural or pericardial effusion. Coronary calcifications. Hepatobiliary: Multiple small cysts as before. No biliary ductal dilatation. Gallbladder incompletely distended. Pancreas: Unremarkable. No pancreatic ductal dilatation or surrounding inflammatory changes. Spleen: Normal in size without focal abnormality. Adrenals/Urinary Tract: No adrenal mass. Symmetric renal parenchymal  enhancement without hydronephrosis or worrisome lesion. Urinary bladder partially decompressed. Foley catheter balloon has been inflated within a small anterior diverticulum, tip isolated from lumen of urinary bladder. Stomach/Bowel: Stomach is nondistended. Small bowel decompressed. Normal appendix. Colon partially distended by gas and fecal material with scattered diverticula from the descending and proximal sigmoid segments. Mild inflammatory changes posterior to the distal descending colon (Im121,Se6) without abscess. Lymphatic: No abdominal or pelvic adenopathy. Reproductive: Prostate enlargement with central coarse calcifications. Other: No ascites.  No free air. Musculoskeletal: 5.3 cm hematoma overlying proximal right SFA. 6.8 cm hematoma in the deep subcutaneous tissues overlying proximal right SFA, with possible enhancing neck suggesting pseudoaneurysm. Postop changes and skin staples overlying bilateral common femoral vessels. Left knee effusion. Left thigh and calf soft tissue swelling with subcutaneous edematous change. Regional bones unremarkable. IMPRESSION: 1. 6.8 cm hematoma in the deep subcutaneous tissues overlying proximal right SFA, with possible enhancing neck suggesting pseudoaneurysm. 2. 5.3 cm hematoma overlying proximal right SFA. 3. Restoration of flow through the left common and external iliac arteries. 4. Bilateral long segment SFA and popliteal artery occlusions with collateral reconstitution of a small patent channel below the knee. 5. Foley catheter balloon malpositioned within a small anterior bladder diverticulum,  tip isolated from lumen of urinary bladder. Recommend repositioning. 6. Descending and sigmoid diverticulosis with mild inflammatory changes posterior to the distal descending colon, possible mild diverticulitis. 7.  Aortic Atherosclerosis (ICD10-I70.0). Electronically Signed: By: Corlis Leak M.D. On: 11/06/2022 08:20   DG Chest Port 1 View  Result Date:  11/05/2022 CLINICAL DATA:  Abnormal breath sounds EXAM: PORTABLE CHEST 1 VIEW COMPARISON:  09/10/2020 FINDINGS: Transverse diameter of heart is increased. Thoracic aorta is tortuous and ectatic. There are no signs of pulmonary edema more focal pulmonary consolidation. There is no pleural effusion or pneumothorax. IMPRESSION: There are no signs of pulmonary edema or focal pulmonary consolidation. Electronically Signed   By: Ernie Avena M.D.   On: 11/05/2022 17:19   CT Angio Chest Pulmonary Embolism (PE) W or WO Contrast  Result Date: 11/05/2022 CLINICAL DATA:  High clinical suspicion for PE EXAM: CT ANGIOGRAPHY CHEST WITH CONTRAST TECHNIQUE: Multidetector CT imaging of the chest was performed using the standard protocol during bolus administration of intravenous contrast. Multiplanar CT image reconstructions and MIPs were obtained to evaluate the vascular anatomy. RADIATION DOSE REDUCTION: This exam was performed according to the departmental dose-optimization program which includes automated exposure control, adjustment of the mA and/or kV according to patient size and/or use of iterative reconstruction technique. CONTRAST:  75mL OMNIPAQUE IOHEXOL 350 MG/ML SOLN COMPARISON:  Noncontrast CT chest done on 08/05/2020 and 11/02/2022 FINDINGS: Cardiovascular: There are no intraluminal filling defects in pulmonary artery branches. There is homogeneous enhancement in thoracic aorta. Atherosclerotic plaques and calcifications are seen in thoracic aorta and its major branches. Coronary artery calcifications are seen. Minimal pericardial effusion is present. Mediastinum/Nodes: No significant lymphadenopathy is seen. Lungs/Pleura: Small patchy densities are seen in posterior right apical region. Similar finding was seen in the previous examination. There are small linear densities in both apices. There is no new focal consolidation. There is no pleural effusion or pneumothorax. Upper Abdomen: There is high density  in the dependent portion of gallbladder lumen suggesting possible gallbladder stones. There is no dilation of bile ducts. Numerous scattered small low-density foci in liver with no significant change. Fairly extensive arterial calcifications are seen. There is 2 mm calcific density in the upper pole of left kidney suggesting possible small nonobstructing stone. There is 2.7 cm cyst in the upper pole of right kidney. There are other small subcentimeter low-density foci in the right kidney. Musculoskeletal: No acute findings are seen. Review of the MIP images confirms the above findings. IMPRESSION: There is no evidence of pulmonary artery embolism. There is no evidence of thoracic aortic dissection. Small focus of increased markings in the posterior right apical region has not changed significantly, possibly scarring. There is no focal pulmonary consolidation. There is no pleural effusion. Coronary artery disease. Aortic arteriosclerosis. Possible gallbladder stones. There are numerous small low-density foci in the liver with no significant interval change, possibly cysts. 2 mm left renal stone. There are right renal cysts measuring up to 2.7 cm. Electronically Signed   By: Ernie Avena M.D.   On: 11/05/2022 17:18   DG C-Arm 1-60 Min-No Report  Result Date: 11/04/2022 Fluoroscopy was utilized by the requesting physician.  No radiographic interpretation.   ECHOCARDIOGRAM COMPLETE  Result Date: 11/03/2022    ECHOCARDIOGRAM REPORT   Patient Name:   Corey Olson Date of Exam: 11/03/2022 Medical Rec #:  [id number]    Height:       67.0 in Accession #:    [id number]   Weight:  176.6 lb Date of Birth:  [birth year]     BSA:          1.918 m Patient Age:    80 years     BP:           157/70 mmHg Patient Gender: M            HR:           97 bpm. Exam Location:  [facility] Procedure: 2D Echo, Cardiac Doppler, Color Doppler, Strain Analysis and 3D Echo Indications:     CAD  History:         Patient has prior history  of Echocardiogram examinations, most                  recent 07/09/2020. CAD, Abnormal ECG, Stroke, PAD and COPD; Risk                  Factors:Hypertension, Dyslipidemia and Current Smoker. ETOH                  abuse.  Sonographer:     Mikki Harbor Referring Phys:  [id number] Antonieta Iba Diagnosing Phys: Julien Nordmann MD  Sonographer Comments: Global longitudinal strain was attempted. IMPRESSIONS  1. Left ventricular ejection fraction, by estimation, is 50 to 55%. The left ventricle has low normal function. The left ventricle has no regional wall motion abnormalities. Left ventricular diastolic parameters are consistent with Grade I diastolic dysfunction (impaired relaxation). The average left ventricular global longitudinal strain is -8.9 %.  2. Right ventricular systolic function is normal. The right ventricular size is normal.  3. The mitral valve is normal in structure. Mild mitral valve regurgitation. No evidence of mitral stenosis.  4. The aortic valve is tricuspid. Aortic valve regurgitation is not visualized. Aortic valve sclerosis is present, with no evidence of aortic valve stenosis.  5. There is borderline dilatation of the aortic root, measuring 37 mm.  6. The inferior vena cava is normal in size with greater than 50% respiratory variability, suggesting right atrial pressure of 3 mmHg. FINDINGS  Left Ventricle: Left ventricular ejection fraction, by estimation, is 50 to 55%. The left ventricle has low normal function. The left ventricle has no regional wall motion abnormalities. The average left ventricular global longitudinal strain is -8.9 %.  The left ventricular internal cavity size was normal in size. There is no left ventricular hypertrophy. Left ventricular diastolic parameters are consistent with Grade I diastolic dysfunction (impaired relaxation). Right Ventricle: The right ventricular size is normal. No increase in right ventricular wall thickness. Right ventricular systolic function is  normal. Left Atrium: Left atrial size was normal in size. Right Atrium: Right atrial size was normal in size. Pericardium: There is no evidence of pericardial effusion. Mitral Valve: The mitral valve is normal in structure. Mild mitral valve regurgitation. No evidence of mitral valve stenosis. MV peak gradient, 6.9 mmHg. The mean mitral valve gradient is 2.0 mmHg. Tricuspid Valve: The tricuspid valve is normal in structure. Tricuspid valve regurgitation is mild . No evidence of tricuspid stenosis. Aortic Valve: The aortic valve is tricuspid. Aortic valve regurgitation is not visualized. Aortic valve sclerosis is present, with no evidence of aortic valve stenosis. Aortic valve mean gradient measures 3.0 mmHg. Aortic valve peak gradient measures 7.0  mmHg. Aortic valve area, by VTI measures 2.55 cm. Pulmonic Valve: The pulmonic valve was normal in structure. Pulmonic valve regurgitation is not visualized. No evidence of pulmonic stenosis. Aorta: The aortic root is normal  in size and structure. There is borderline dilatation of the aortic root, measuring 37 mm. Venous: The inferior vena cava is normal in size with greater than 50% respiratory variability, suggesting right atrial pressure of 3 mmHg. IAS/Shunts: No atrial level shunt detected by color flow Doppler.  LEFT VENTRICLE PLAX 2D LVIDd:         4.80 cm      Diastology LVIDs:         3.70 cm      LV e' medial:    4.68 cm/s LV PW:         1.50 cm      LV E/e' medial:  10.3 LV IVS:        1.20 cm      LV e' lateral:   5.22 cm/s LVOT diam:     2.10 cm      LV E/e' lateral: 9.2 LV SV:         66 LV SV Index:   34           2D Longitudinal Strain LVOT Area:     3.46 cm     2D Strain GLS Avg:     -8.9 %  LV Volumes (MOD) LV vol d, MOD A2C: 104.0 ml LV vol d, MOD A4C: 75.5 ml LV vol s, MOD A2C: 60.1 ml LV vol s, MOD A4C: 47.3 ml LV SV MOD A2C:     43.9 ml LV SV MOD A4C:     75.5 ml LV SV MOD BP:      38.4 ml RIGHT VENTRICLE RV Basal diam:  3.50 cm RV Mid diam:    3.50  cm RV S prime:     15.90 cm/s TAPSE (M-mode): 3.2 cm LEFT ATRIUM             Index        RIGHT ATRIUM           Index LA diam:        3.90 cm 2.03 cm/m   RA Area:     17.70 cm LA Vol (A2C):   67.7 ml 35.30 ml/m  RA Volume:   48.10 ml  25.08 ml/m LA Vol (A4C):   32.5 ml 16.94 ml/m LA Biplane Vol: 48.7 ml 25.39 ml/m  AORTIC VALVE                    PULMONIC VALVE AV Area (Vmax):    2.60 cm     PV Vmax:       0.98 m/s AV Area (Vmean):   2.54 cm     PV Peak grad:  3.9 mmHg AV Area (VTI):     2.55 cm AV Vmax:           132.00 cm/s AV Vmean:          84.700 cm/s AV VTI:            0.258 m AV Peak Grad:      7.0 mmHg AV Mean Grad:      3.0 mmHg LVOT Vmax:         99.00 cm/s LVOT Vmean:        62.200 cm/s LVOT VTI:          0.190 m LVOT/AV VTI ratio: 0.74  AORTA Ao Root diam: 3.70 cm MITRAL VALVE MV Area (PHT): 4.80 cm     SHUNTS MV Area VTI:   2.32 cm     Systemic VTI:  0.19 m MV Peak grad:  6.9 mmHg     Systemic Diam: 2.10 cm MV Mean grad:  2.0 mmHg MV Vmax:       1.31 m/s MV Vmean:      64.4 cm/s MV Decel Time: 158 msec MV E velocity: 48.20 cm/s MV A velocity: 121.00 cm/s MV E/A ratio:  0.40 Julien Nordmann MD Electronically signed by Julien Nordmann MD Signature Date/Time: 11/03/2022/4:07:01 PM    Final              LOS: 4 days       Sunnie Nielsen, DO [facility] 11/06/2022, 3:49 PM    Dictation software may have been used to generate the above note. Typos may occur and escape review in typed/dictated notes. Please contact Dr Lyn Hollingshead directly for clarity if needed.  Staff may message me via secure chat in Epic  but this may not receive an immediate response,  please page me for urgent matters!  If 7PM-7AM, please contact night coverage [email or web address]

## 2022-11-07 ENCOUNTER — Encounter: Payer: Self-pay | Admitting: Vascular Surgery

## 2022-11-07 DIAGNOSIS — I998 Other disorder of circulatory system: Secondary | ICD-10-CM | POA: Diagnosis not present

## 2022-11-07 DIAGNOSIS — F1010 Alcohol abuse, uncomplicated: Secondary | ICD-10-CM | POA: Diagnosis not present

## 2022-11-07 DIAGNOSIS — R9431 Abnormal electrocardiogram [ECG] [EKG]: Secondary | ICD-10-CM | POA: Diagnosis not present

## 2022-11-07 DIAGNOSIS — W19XXXA Unspecified fall, initial encounter: Secondary | ICD-10-CM | POA: Diagnosis not present

## 2022-11-07 LAB — CBC
HCT: 23.2 % — ABNORMAL LOW (ref 39.0–52.0)
Hemoglobin: 7.7 g/dL — ABNORMAL LOW (ref 13.0–17.0)
MCH: 28.9 pg (ref 26.0–34.0)
MCHC: 33.2 g/dL (ref 30.0–36.0)
MCV: 87.2 fL (ref 80.0–100.0)
Platelets: 178 10*3/uL (ref 150–400)
RBC: 2.66 MIL/uL — ABNORMAL LOW (ref 4.22–5.81)
RDW: 14.9 % (ref 11.5–15.5)
WBC: 16.6 10*3/uL — ABNORMAL HIGH (ref 4.0–10.5)
nRBC: 0 % (ref 0.0–0.2)

## 2022-11-07 LAB — TYPE AND SCREEN
ABO/RH(D): A NEG
Antibody Screen: NEGATIVE

## 2022-11-07 LAB — BASIC METABOLIC PANEL
Anion gap: 9 (ref 5–15)
BUN: 14 mg/dL (ref 8–23)
CO2: 23 mmol/L (ref 22–32)
Calcium: 7.5 mg/dL — ABNORMAL LOW (ref 8.9–10.3)
Chloride: 107 mmol/L (ref 98–111)
Creatinine, Ser: 0.95 mg/dL (ref 0.61–1.24)
GFR, Estimated: >60 mL/min (ref >60)
Glucose, Bld: 130 mg/dL — ABNORMAL HIGH (ref 70–99)
Potassium: 3.2 mmol/L — ABNORMAL LOW (ref 3.5–5.1)
Sodium: 139 mmol/L (ref 135–145)

## 2022-11-07 LAB — PREPARE RBC (CROSSMATCH)

## 2022-11-07 LAB — URINE CULTURE: Culture: NO GROWTH

## 2022-11-07 LAB — BPAM RBC
Blood Product Expiration Date: 202406262359
ISSUE DATE / TIME: 202406171226
Unit Type and Rh: 600
Unit Type and Rh: 9500

## 2022-11-07 LAB — CULTURE, BLOOD (ROUTINE X 2): Special Requests: ADEQUATE

## 2022-11-07 LAB — HEMOGLOBIN AND HEMATOCRIT, BLOOD
HCT: 26.8 % — ABNORMAL LOW (ref 39.0–52.0)
Hemoglobin: 9.2 g/dL — ABNORMAL LOW (ref 13.0–17.0)

## 2022-11-07 MED ORDER — SODIUM CHLORIDE 0.9% IV SOLUTION: Freq: Once | INTRAVENOUS | Status: AC

## 2022-11-07 NOTE — Progress Notes (Signed)
Occupational Therapy Treatment Patient Details Name: Corey Olson MRN: [id number] DOB: 14-Apr-1956 Today's Date: 11/07/2022   History of present illness Pt is a 67 yo male s/p bilateral femoral endarterectomy and iliac stenting. 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 PMH of COPD, gout, HLD, HTN, PVD, CVA, etoh.   OT comments  Pt seen for OT treatment on this date. Upon arrival to room pt laying in bed, agreeable to tx. Pt requires MIN A for LE management during bed mobility. MIN A +2 sit<>stand with RW in place. MOD A for upper body dressing. Pt reports that he uses w/c and/or cane at home for ambulation. Reports that his sister assists with ADLs at baseline. Pt demonstrated good effort throughout session. Reported pain in groin area during movement, but eased during rest. Pt making good progress toward goals, will continue to follow POC. Discharge recommendation remains appropriate.     Recommendations for follow up therapy are one component of a multi-disciplinary discharge planning process, led by the attending physician.  Recommendations may be updated based on patient status, additional functional criteria and insurance authorization.    Assistance Recommended at Discharge Frequent or constant Supervision/Assistance  Patient can return home with the following  Two people to help with walking and/or transfers;Two people to help with bathing/dressing/bathroom;Assistance with cooking/housework;Direct supervision/assist for medications management;Direct supervision/assist for financial management;Assist for transportation;Help with stairs or ramp for entrance   Equipment Recommendations  Other (comment) (defer)    Recommendations for Other Services      Precautions / Restrictions Precautions Precautions: Fall Restrictions Weight Bearing Restrictions: No        Mobility Bed Mobility Overal bed mobility: Needs Assistance Bed Mobility: Supine to Sit, Sit to Supine     Supine to sit: Min assist, +2 for physical assistance, HOB elevated Sit to supine: Min assist   General bed mobility comments: pt able to bridge to readjust. MIN A for LE management    Transfers Overall transfer level: Needs assistance Equipment used: Rolling walker (2 wheels) Transfers: Sit to/from Stand Sit to Stand: Min assist, +2 physical assistance, +2 safety/equipment                 Balance Overall balance assessment: Needs assistance Sitting-balance support: Feet supported Sitting balance-Leahy Scale: Fair   Postural control: Posterior lean Standing balance support: Bilateral upper extremity supported Standing balance-Leahy Scale: Poor Standing balance comment: Pt stood EOB x2 with M                           ADL either performed or assessed with clinical judgement   ADL Overall ADL's : Needs assistance/impaired                 Upper Body Dressing : Moderate assistance;Sitting Upper Body Dressing Details (indicate cue type and reason): MOD A RUE management during dressing                        Extremity/Trunk Assessment Upper Extremity Assessment Upper Extremity Assessment: RUE deficits/detail RUE Deficits / Details: able to flex/extend R elbow, although tone noted and pt often holding R arm in flexed elbow position with activity.   Lower Extremity Assessment Lower Extremity Assessment: Overall WFL for tasks assessed        Vision  Perception     Praxis      Cognition Arousal/Alertness: Awake/alert Behavior During Therapy: Flat affect Overall Cognitive Status: No family/caregiver present to determine baseline cognitive functioning                                 General Comments: Man of few words, able to answer yes or no questions. Extra time to answer questions.         Exercises      Shoulder Instructions       General Comments      Pertinent Vitals/ Pain       Pain Assessment Pain Assessment: Faces Faces Pain Scale: Hurts little more Pain Location: When provided choses, pt reported groin pain Pain Descriptors / Indicators: Grimacing, Guarding, Moaning Pain Intervention(s): Monitored during session, Repositioned  Home Living                                          Prior Functioning/Environment              Frequency  Min 1X/week        Progress Toward Goals  OT Goals(current goals can now be found in the care plan section)  Progress towards OT goals: Progressing toward goals  Acute Rehab OT Goals Patient Stated Goal: To feel better OT Goal Formulation: With patient Time For Goal Achievement: 11/19/22 Potential to Achieve Goals: Fair ADL Goals Pt Will Perform Eating: sitting;with supervision Pt Will Perform Grooming: with min assist;sitting Pt Will Transfer to Toilet: with min assist;bedside commode  Plan Discharge plan remains appropriate    Co-evaluation                 AM-PAC OT "6 Clicks" Daily Activity     Outcome Measure   Help from another person eating meals?: A Little Help from another person taking care of personal grooming?: A Lot Help from another person toileting, which includes using toliet, bedpan, or urinal?: A Lot Help from another person bathing (including washing, rinsing, drying)?: A Lot Help from another person to put on and taking off regular upper body clothing?: A Lot Help from another person to put on and taking off regular lower body clothing?: A Lot 6 Click Score: 13    End of Session Equipment Utilized During Treatment: Rolling walker (2 wheels)  OT Visit Diagnosis: Other abnormalities of gait and mobility (R26.89);Muscle weakness (generalized) (M62.81)   Activity Tolerance Patient tolerated treatment well   Patient Left in bed;with call bell/phone within  reach;with nursing/sitter in room;with bed alarm set   Nurse Communication Mobility status        Time: 1610-9604 OT Time Calculation (min): 21 min  Charges: OT General Charges $OT Visit: 1 Visit OT Treatments $Self Care/Home Management : 8-22 mins  Thresa Ross, OTS

## 2022-11-07 NOTE — Progress Notes (Signed)
PROGRESS NOTE    Corey Olson   ZOX:[id number] DOB: 1955-09-13  DOA: 11/01/2022 Date of Service: 11/07/22 PCP: [name], MD     Brief Narrative / Hospital Course:  Corey Olson is a 67 y.o. male with a history of stroke, peripheral atrial disease who presents with altered mental status, left leg weakness.  Family spoke to him at 430 and felt that he was confused.  Prior to that he had not been spoken to since the night before when he had seemed normal.  Patient reports left leg weakness dragging his left foot while walking and decreased sensation, all of which were new but without clear timelines. 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  06/11: in ED, VSS, code stroke called Per neurology - recs MRI brain, no TPN. Admitted to hospitalist service. Elevated AST/ALT c/w EtOH but EtOH levels <10 and RUQ [place] (+)hepatic steatosis, , Lactic acid ok at 1.3, CBC no concerns, elevated D-Dimer, CK >14,000 and UA no UTI but (+)Hgb no significant RBC c/w rhabdo, EtOH <10, UDS neg. [place] bilateral LE (+)DVT L femoral and popliteal V.  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 Found to have weakneed pulses LLE and CTA runoff performed showing ischemia/severe PAD LLE, on exam LLE is cold below mid-tibia so vascular consulted, continue heparin gtt, taken to cath lab for RLE and LLE angiogram. Significant vascular disease, limb likely not salvageable per Dr Wyn Quaker - prognosis poor for even AKA healing, will likely need extensive endarterectomy/stenting to even salvage an AKA. Can plan for surgery later this week, will need cardiac clearance  06/13: anticipate surgery tomorrow  06/14: endarterectomy/stents bilateral femoral  06/15: recovering in ICU. Continue heparin infusion. Has been in sinus tach w/ elevated temp, (+)SIRS, (+)UA concern for sepsis d/t UTI vs SIRS w/  other medical issues and also eval for PE was neg.  06/16: swelling in extremity, received 1 unit PRBC, CT LLE (+)hematoma/pseudoaneurysm, to OR w/ Dr Lenell Antu for evacuation hematoma and repair venous branch bleed.  06/17: some bleeding around drain site overnight requiring few dressing changes, Hgb 7.7 this am, 1 unit PRBC ordered, per vascular ok to hold heparin another 6 hrs and will monitor.   Consultants:  Neurology Vascular surgery  Cardiology   Procedures: 11/02/22 Bilateral LE angiogram w/ Dr Wyn Quaker  11/04/22 endarterectomy/stents bilateral femoral w/ Dr Wyn Quaker 11/06/22  evacuation of left groin hematoma and direct repair of venous branch bleeding w/ Dr Lenell Antu       ASSESSMENT & PLAN:   Principal Problem:   Fall Active Problems:   Fall at home, initial encounter   Tobacco abuse   PAD (peripheral artery disease) (HCC)   Essential hypertension   Alcohol abuse   RUE weakness   Non-traumatic rhabdomyolysis   Positive D dimer   Abnormal EKG   LLE w/ severe PAD, ischemic L foot w/ neurological deficits likely cause for fall S/p angiograpy and endarterectomy/stenting  Vascular surgery following Possibly will need L AKA   SIRS - improving  (+)SIRS, w/ tachycardia, tachypnea, elevated WBC CXR --> no apparent pneumonia  UA --> UTI, see below  BCx, UCx Also eval for PE w/ CTA given high risk, in case needing thrombectomy   ABLA Postoperative hematoma - evacuated  Monitor CBC/HH closely Transfusion tpday (this makes 2 units PRBC total thus far) Holding heparin this morning  Remain in stepdown for now, will leave there for today if heparin restarted, if no bleeding may consider transfer to progressive   UTI question sepsis d/t UTI vs SIRS d/t other see above  Ceftriaxone initiated 11/05/22  Await cultures --> UCx no growth   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 HTN HLD Previous Lexiscan w/o ischemia Per cardiology note 02/2021 "high risk, if he develops  chest pain with exertion or dyspnea, will schedule left heart cath." Continuing on heparin infusion per vascular team  We were holding statin given transaminitis but risk of holding may be greater than risk to liver, continue statin and will monitor LFT's closely  DVT L femoral and popliteal V  PE ruled out  Heparin gtt Vascular surgery following but no intervention needed at this time for the DVT    Rhabdomyolysis w/ normal renal function  IV fluids Follow renal function and CK   History CVA w/ residual R sided deficits (+)subacute R cerebellar punctate infarct on MRI ASA We were holding statin given transaminitis but risk of holding may be greater than risk to liver, will monitor LFT's closely Neurology following  On heparin gtt for DVT and ischemic leg  Transaminitis - improving  Hepatic Steatosis on RUQ [place] EtOH use but no acute intoxication based on blood levels - question alcoholic hepatitis  Follow CMP We were holding statin given transaminitis but risk of holding may be greater than risk to liver, will monitor LFT's closely  Weakness and fall Confusion / AMS question toxic metabolic encephalopathy or CVA Likely multifactorial Pressing concern for ischemic LLE as above w/ neuro deficits predisposing to fall  Concern for CVA see MRI brain Potential EtOH withdrawal  Treat multiple underlying issues  PT/OT as able Fall precaution   EtOH use but no acute intoxication based on blood levels  CIWA protocol  Thiamine  Hypokalemia Replace as needed Monitor BMP  Essential HTN lower BP to goal - window for permissive HTN w/ CVA has lapsed  Tobacco abuse Nicotine patch     DVT: treating w/ heparin  Pertinent IV fluids/nutrition: NS 100 mL/h Central lines / invasive devices: none  Code Status: FULL CODE ACP documentation reviewed: 11/02/22 none on file   Current Admission Status: inpatient  - Remain in stepdown for now, will leave there for today if heparin  restarted, if no bleeding may consider transfer to progressive  TOC needs / Dispo plan: TBD expect will need SNF Barriers to discharge / significant pending items: possible amputation              Subjective / Brief ROS:  Patient examined in ICU Alert Denies chest pain, SOB or palpitations Alert, no concerns  (+)pain LLE   Family Communication: none at this time    Objective Findings:  Vitals:   11/07/22 0754 11/07/22 0800 11/07/22 0900 11/07/22 1000  BP:  (!) 139/58 (!) 128/52 (!) 118/57  Pulse: 86 85 75 97  Resp: (!) 22 (!) 27 20 (!) 24  Temp: 97.9 F (36.6 C)     TempSrc: Oral     SpO2: 97% 96% 93% 98%  Weight:      Height:        Intake/Output Summary (Last 24 hours) at 11/07/2022 1043 Last data filed at 11/07/2022 1000 Gross per 24 hour  Intake 2613.21 ml  Output 1490 ml  Net 1123.21 ml   Filed Weights   11/03/22 0413 11/04/22 0328 11/07/22 0500  Weight: 80.1 kg 71.2 kg 76.3  kg    Examination:  Physical Exam Constitutional:      General: He is not in acute distress. Cardiovascular:     Rate and Rhythm: Regular rhythm. Tachycardia present.  Pulmonary:     Effort: Pulmonary effort is normal. No respiratory distress.     Breath sounds: No rhonchi.  Abdominal:     General: There is no distension.     Palpations: Abdomen is soft.  Musculoskeletal:     Right lower leg: No edema.     Left lower leg: No edema.  Skin:    General: Skin is warm and dry.     Comments: LLE warmer, RN reports (+)doppler pulses   Neurological:     Mental Status: He is alert.  Psychiatric:        Behavior: Behavior normal.   L groin dressing appears clean, serosanguinous fluid in drain        Scheduled Medications:   aspirin EC  81 mg Oral Daily   atorvastatin  80 mg Oral QPM   Chlorhexidine Gluconate Cloth  6 each Topical Daily   metoprolol succinate  50 mg Oral Daily   nicotine  21 mg Transdermal Daily   pantoprazole  40 mg Oral BID   sodium chloride  flush  3 mL Intravenous Q12H   thiamine  100 mg Oral Daily    Continuous Infusions:  sodium chloride 100 mL/hr at 11/07/22 1000   cefTRIAXone (ROCEPHIN)  IV Stopped (11/06/22 1714)    PRN Medications:  acetaminophen **OR** acetaminophen, ondansetron (ZOFRAN) IV, mouth rinse  Antimicrobials from admission:  Anti-infectives (From admission, onward)    Start     Dose/Rate Route Frequency Ordered Stop   11/05/22 1645  cefTRIAXone (ROCEPHIN) 1 g in sodium chloride 0.9 % 100 mL IVPB        1 g 200 mL/hr over 30 Minutes Intravenous Every 24 hours 11/05/22 1555     11/04/22 0634  ceFAZolin (ANCEF) IVPB 2g/100 mL premix        2 g 200 mL/hr over 30 Minutes Intravenous 30 min pre-op 11/04/22 0634 11/04/22 1251   11/02/22 1431  ceFAZolin (ANCEF) IVPB 2g/100 mL premix        2 g 200 mL/hr over 30 Minutes Intravenous 30 min pre-op 11/02/22 1431 11/02/22 1653           Data Reviewed:  I have personally reviewed the following...  CBC: Recent Labs  Lab 11/01/22 1843 11/02/22 0625 11/03/22 0545 11/04/22 0643 11/05/22 0119 11/06/22 0511 11/06/22 1317 11/07/22 0548  WBC 9.3   < > 10.2 12.2* 14.4* 13.8*  --  16.6*  NEUTROABS 7.5  --   --   --   --   --   --   --   HGB 14.5   < > 12.7* 12.7* 10.3* 8.2* 8.6* 7.7*  HCT 44.9   < > 38.3* 38.6* 31.5* 25.5* 26.1* 23.2*  MCV 90.3   < > 88.5 88.1 89.5 91.1  --  87.2  PLT 156   < > 148* 161 163 177  --  178   < > = values in this interval not displayed.   Basic Metabolic Panel: Recent Labs  Lab 11/01/22 2001 11/02/22 0625 11/03/22 0545 11/04/22 0643 11/05/22 0119 11/06/22 0511 11/07/22 0548  NA  --    < > 139 138 138 138 139  K  --    < > 3.0* 3.0* 3.5 3.3* 3.2*  CL  --    < >  106 103 106 105 107  CO2  --    < > 24 25 22 23 23   GLUCOSE  --    < > 106* 123* 130* 104* 130*  BUN  --    < > 11 9 11 12 14   CREATININE  --    < > 1.00 0.87 0.95 1.02 0.95  CALCIUM  --    < > 8.4* 8.5* 7.8* 7.8* 7.5*  MG 2.0  --   --   --   --   --    --    < > = values in this interval not displayed.   GFR: Estimated Creatinine Clearance: 70.5 mL/min (by C-G formula based on SCr of 0.95 mg/dL). Liver Function Tests: Recent Labs  Lab 11/01/22 1843 11/02/22 0625 11/03/22 0545 11/04/22 0643 11/05/22 0119  AST 296* 366* 408* 426* 243*  ALT 63* 74* 75* 80* 58*  ALKPHOS 55 57 49 49 38  BILITOT 1.3* 1.6* 0.9 0.7 1.3*  PROT 7.0 6.6 6.3* 6.7 6.2*  ALBUMIN 4.2 3.7 3.3* 3.5 3.6   Recent Labs  Lab 11/01/22 2001  LIPASE 27   No results for input(s): "AMMONIA" in the last 168 hours. Coagulation Profile: Recent Labs  Lab 11/01/22 1843 11/04/22 0643  INR 1.0 1.1   Cardiac Enzymes: Recent Labs  Lab 11/01/22 2001 11/03/22 0545 11/06/22 0500  CKTOTAL 14,578* 13,747* 4,484*   BNP (last 3 results) No results for input(s): "PROBNP" in the last 8760 hours. HbA1C: No results for input(s): "HGBA1C" in the last 72 hours.  CBG: Recent Labs  Lab 11/01/22 1842 11/02/22 0215 11/04/22 1743  GLUCAP 91 84 142*   Lipid Profile: No results for input(s): "CHOL", "HDL", "LDLCALC", "TRIG", "CHOLHDL", "LDLDIRECT" in the last 72 hours.  Thyroid Function Tests: No results for input(s): "TSH", "T4TOTAL", "FREET4", "T3FREE", "THYROIDAB" in the last 72 hours. Anemia Panel: No results for input(s): "VITAMINB12", "FOLATE", "FERRITIN", "TIBC", "IRON", "RETICCTPCT" in the last 72 hours. Most Recent Urinalysis On File:     Component Value Date/Time   COLORURINE YELLOW (A) 11/05/2022 1450   APPEARANCEUR CLOUDY (A) 11/05/2022 1450   LABSPEC 1.029 11/05/2022 1450   PHURINE 5.0 11/05/2022 1450   GLUCOSEU NEGATIVE 11/05/2022 1450   HGBUR LARGE (A) 11/05/2022 1450   BILIRUBINUR NEGATIVE 11/05/2022 1450   KETONESUR 5 (A) 11/05/2022 1450   PROTEINUR 100 (A) 11/05/2022 1450   NITRITE NEGATIVE 11/05/2022 1450   LEUKOCYTESUR MODERATE (A) 11/05/2022 1450   Sepsis Labs: @LABRCNTIP (procalcitonin:4,lacticidven:4) Microbiology: Recent Results  (from the past 240 hour(s))  Urine Culture (for pregnant, neutropenic or urologic patients or patients with an indwelling urinary catheter)     Status: None   Collection Time: 11/05/22  2:50 PM   Specimen: Urine, Clean Catch  Result Value Ref Range Status   Specimen Description   Final    URINE, CLEAN CATCH Performed at [facility], [address] [place], [place] [address]    Special Requests   Final    NONE Performed at [facility], [address], [place], [place] [address]    Culture   Final    NO GROWTH Performed at [facility], [address], [place], [place] [address]    Report Status 11/07/2022 FINAL  Final  Culture, blood (Routine X 2) w Reflex to ID Panel     Status: None (Preliminary result)   Collection Time: 11/05/22  3:26 PM   Specimen: BLOOD  Result Value Ref Range Status  Specimen Description BLOOD BLOOD RIGHT HAND  Final   Special Requests   Final    BOTTLES DRAWN AEROBIC AND ANAEROBIC Blood Culture adequate volume   Culture   Final    NO GROWTH 2 DAYS Performed at [facility], [address] [place], [place] [address]    Report Status PENDING  Incomplete  Culture, blood (Routine X 2) w Reflex to ID Panel     Status: None (Preliminary result)   Collection Time: 11/05/22  3:29 PM   Specimen: BLOOD  Result Value Ref Range Status   Specimen Description BLOOD BLOOD LEFT HAND  Final   Special Requests   Final    BOTTLES DRAWN AEROBIC AND ANAEROBIC Blood Culture adequate volume   Culture   Final    NO GROWTH 2 DAYS Performed at [facility], [address], [place], [place] [address]    Report Status PENDING  Incomplete      Radiology Studies last 3 days: CT ANGIO AO+BIFEM W & OR WO CONTRAST  Addendum Date: 11/06/2022   ADDENDUM REPORT: 11/06/2022 11:10 ADDENDUM: Laterality error in impression #1,  which should read: 1. 6.8 cm hematoma in the deep subcutaneous tissues overlying the LEFT SFA, with possible  pseudoaneurysm. Electronically Signed   By: Corlis Leak M.D.   On: 11/06/2022 11:10   Result Date: 11/06/2022 CLINICAL DATA:  Soft tissue mass deep thigh EXAM: CT ANGIOGRAPHY OF ABDOMINAL AORTA WITH ILIOFEMORAL RUNOFF TECHNIQUE: Multidetector CT imaging of the abdomen, pelvis and lower extremities was performed using the standard protocol during bolus administration of intravenous contrast. Multiplanar CT image reconstructions and MIPs were obtained to evaluate the vascular anatomy. RADIATION DOSE REDUCTION: This exam was performed according to the departmental dose-optimization program which includes automated exposure control, adjustment of the mA and/or kV according to patient size and/or use of iterative reconstruction technique. CONTRAST:  OMNIPAQUE IOHEXOL 350 MG/ML SOLN COMPARISON:  11/02/2022 FINDINGS: VASCULAR Aorta: Moderate calcified atheromatous plaque. No aneurysm, dissection, or significant stenosis. Eccentric intraluminal thrombus in the infrarenal segment without high-grade stenosis as before, an a embolic risk. Celiac: Common origin with SMA. There is heavy calcified plaque with mild ostial stenosis of doubtful hemodynamic significance. SMA: Common origin with the celiac axis. Mild origin stenosis, and scattered calcified atheromatous plaque distally resulting in tandem areas of stenosis. Renals: Single right, with heavily calcified ostial plaque over length of approximately 1.2 cm resulting in at least mild stenosis, patent distally. Single left, with heavily calcified ostial plaque over length of 1.6 cm resulting in stenosis of at least moderate severity, atheromatous but patent distally. IMA: Patent, diminutive RIGHT Lower Extremity Inflow: Common iliac heavily calcified plaque. Patent stent in across its bifurcation. Internal iliac origin occlusion External iliac heavily atheromatous but patent with overlapping stents through its length. Outflow: Common femoral patent. Deep femoral  branches patent. SFA long segment occlusion from just beyond its origin through its length. Popliteal is atheromatous, with a small patent channel reconstituted by collaterals. Runoff: Heavily calcified proximally, limiting luminal evaluation. There is enhancement of the distal anterior tibial artery crossing the ankle as dorsalis pedis. The peroneal artery is opacified just above the calf. LEFT Lower Extremity Inflow: Common iliac heavily atheromatous but now patent, stented through its length. Internal iliac long segment origin occlusion. External iliac atheromatous, stented and now patent through its length Outflow: Common femoral patent. Deep femoral branches patent. SFA long segment occlusion through its length. Popliteal long segment occlusion from its origin. There is collateral reconstitution  of a small patent channel below the knee. Runoff: Scattered calcified atheromatous plaque. Anterior and posterior tibial arteries occlude proximal calf. Peroneal artery is diminutive but appears patent to just above the ankle. Veins: No obvious venous abnormality within the limitations of this arterial phase study. Review of the MIP images confirms the above findings. NON-VASCULAR Lower chest: No pleural or pericardial effusion. Coronary calcifications. Hepatobiliary: Multiple small cysts as before. No biliary ductal dilatation. Gallbladder incompletely distended. Pancreas: Unremarkable. No pancreatic ductal dilatation or surrounding inflammatory changes. Spleen: Normal in size without focal abnormality. Adrenals/Urinary Tract: No adrenal mass. Symmetric renal parenchymal enhancement without hydronephrosis or worrisome lesion. Urinary bladder partially decompressed. Foley catheter balloon has been inflated within a small anterior diverticulum, tip isolated from lumen of urinary bladder. Stomach/Bowel: Stomach is nondistended. Small bowel decompressed. Normal appendix. Colon partially distended by gas and fecal material  with scattered diverticula from the descending and proximal sigmoid segments. Mild inflammatory changes posterior to the distal descending colon (Im121,Se6) without abscess. Lymphatic: No abdominal or pelvic adenopathy. Reproductive: Prostate enlargement with central coarse calcifications. Other: No ascites.  No free air. Musculoskeletal: 5.3 cm hematoma overlying proximal right SFA. 6.8 cm hematoma in the deep subcutaneous tissues overlying proximal right SFA, with possible enhancing neck suggesting pseudoaneurysm. Postop changes and skin staples overlying bilateral common femoral vessels. Left knee effusion. Left thigh and calf soft tissue swelling with subcutaneous edematous change. Regional bones unremarkable. IMPRESSION: 1. 6.8 cm hematoma in the deep subcutaneous tissues overlying proximal right SFA, with possible enhancing neck suggesting pseudoaneurysm. 2. 5.3 cm hematoma overlying proximal right SFA. 3. Restoration of flow through the left common and external iliac arteries. 4. Bilateral long segment SFA and popliteal artery occlusions with collateral reconstitution of a small patent channel below the knee. 5. Foley catheter balloon malpositioned within a small anterior bladder diverticulum, tip isolated from lumen of urinary bladder. Recommend repositioning. 6. Descending and sigmoid diverticulosis with mild inflammatory changes posterior to the distal descending colon, possible mild diverticulitis. 7.  Aortic Atherosclerosis (ICD10-I70.0). Electronically Signed: By: Corlis Leak M.D. On: 11/06/2022 08:20   DG Chest Port 1 View  Result Date: 11/05/2022 CLINICAL DATA:  Abnormal breath sounds EXAM: PORTABLE CHEST 1 VIEW COMPARISON:  09/10/2020 FINDINGS: Transverse diameter of heart is increased. Thoracic aorta is tortuous and ectatic. There are no signs of pulmonary edema more focal pulmonary consolidation. There is no pleural effusion or pneumothorax. IMPRESSION: There are no signs of pulmonary edema or  focal pulmonary consolidation. Electronically Signed   By: Ernie Avena M.D.   On: 11/05/2022 17:19   CT Angio Chest Pulmonary Embolism (PE) W or WO Contrast  Result Date: 11/05/2022 CLINICAL DATA:  High clinical suspicion for PE EXAM: CT ANGIOGRAPHY CHEST WITH CONTRAST TECHNIQUE: Multidetector CT imaging of the chest was performed using the standard protocol during bolus administration of intravenous contrast. Multiplanar CT image reconstructions and MIPs were obtained to evaluate the vascular anatomy. RADIATION DOSE REDUCTION: This exam was performed according to the departmental dose-optimization program which includes automated exposure control, adjustment of the mA and/or kV according to patient size and/or use of iterative reconstruction technique. CONTRAST:  75mL OMNIPAQUE IOHEXOL 350 MG/ML SOLN COMPARISON:  Noncontrast CT chest done on 08/05/2020 and 11/02/2022 FINDINGS: Cardiovascular: There are no intraluminal filling defects in pulmonary artery branches. There is homogeneous enhancement in thoracic aorta. Atherosclerotic plaques and calcifications are seen in thoracic aorta and its major branches. Coronary artery calcifications are seen. Minimal pericardial effusion is present. Mediastinum/Nodes: No significant lymphadenopathy  is seen. Lungs/Pleura: Small patchy densities are seen in posterior right apical region. Similar finding was seen in the previous examination. There are small linear densities in both apices. There is no new focal consolidation. There is no pleural effusion or pneumothorax. Upper Abdomen: There is high density in the dependent portion of gallbladder lumen suggesting possible gallbladder stones. There is no dilation of bile ducts. Numerous scattered small low-density foci in liver with no significant change. Fairly extensive arterial calcifications are seen. There is 2 mm calcific density in the upper pole of left kidney suggesting possible small nonobstructing stone.  There is 2.7 cm cyst in the upper pole of right kidney. There are other small subcentimeter low-density foci in the right kidney. Musculoskeletal: No acute findings are seen. Review of the MIP images confirms the above findings. IMPRESSION: There is no evidence of pulmonary artery embolism. There is no evidence of thoracic aortic dissection. Small focus of increased markings in the posterior right apical region has not changed significantly, possibly scarring. There is no focal pulmonary consolidation. There is no pleural effusion. Coronary artery disease. Aortic arteriosclerosis. Possible gallbladder stones. There are numerous small low-density foci in the liver with no significant interval change, possibly cysts. 2 mm left renal stone. There are right renal cysts measuring up to 2.7 cm. Electronically Signed   By: Ernie Avena M.D.   On: 11/05/2022 17:18   DG C-Arm 1-60 Min-No Report  Result Date: 11/04/2022 Fluoroscopy was utilized by the requesting physician.  No radiographic interpretation.   ECHOCARDIOGRAM COMPLETE  Result Date: 11/03/2022    ECHOCARDIOGRAM REPORT   Patient Name:   NATALIA GIEBEL Date of Exam: 11/03/2022 Medical Rec #:  [id number]    Height:       67.0 in Accession #:    [id number]   Weight:       176.6 lb Date of Birth:  05-29-1955     BSA:          1.918 m Patient Age:    67 years     BP:           157/70 mmHg Patient Gender: M            HR:           97 bpm. Exam Location:  [facility] Procedure: 2D Echo, Cardiac Doppler, Color Doppler, Strain Analysis and 3D Echo Indications:     CAD  History:         Patient has prior history of Echocardiogram examinations, most                  recent 07/09/2020. CAD, Abnormal ECG, Stroke, PAD and COPD; Risk                  Factors:Hypertension, Dyslipidemia and Current Smoker. ETOH                  abuse.  Sonographer:     Mikki Harbor Referring Phys:  [id number] Antonieta Iba Diagnosing Phys: Julien Nordmann MD  Sonographer Comments: Global  longitudinal strain was attempted. IMPRESSIONS  1. Left ventricular ejection fraction, by estimation, is 50 to 55%. The left ventricle has low normal function. The left ventricle has no regional wall motion abnormalities. Left ventricular diastolic parameters are consistent with Grade I diastolic dysfunction (impaired relaxation). The average left ventricular global longitudinal strain is -8.9 %.  2. Right ventricular systolic function is normal. The right ventricular size is normal.  3. The mitral valve is  normal in structure. Mild mitral valve regurgitation. No evidence of mitral stenosis.  4. The aortic valve is tricuspid. Aortic valve regurgitation is not visualized. Aortic valve sclerosis is present, with no evidence of aortic valve stenosis.  5. There is borderline dilatation of the aortic root, measuring 37 mm.  6. The inferior vena cava is normal in size with greater than 50% respiratory variability, suggesting right atrial pressure of 3 mmHg. FINDINGS  Left Ventricle: Left ventricular ejection fraction, by estimation, is 50 to 55%. The left ventricle has low normal function. The left ventricle has no regional wall motion abnormalities. The average left ventricular global longitudinal strain is -8.9 %.  The left ventricular internal cavity size was normal in size. There is no left ventricular hypertrophy. Left ventricular diastolic parameters are consistent with Grade I diastolic dysfunction (impaired relaxation). Right Ventricle: The right ventricular size is normal. No increase in right ventricular wall thickness. Right ventricular systolic function is normal. Left Atrium: Left atrial size was normal in size. Right Atrium: Right atrial size was normal in size. Pericardium: There is no evidence of pericardial effusion. Mitral Valve: The mitral valve is normal in structure. Mild mitral valve regurgitation. No evidence of mitral valve stenosis. MV peak gradient, 6.9 mmHg. The mean mitral valve gradient is 2.0  mmHg. Tricuspid Valve: The tricuspid valve is normal in structure. Tricuspid valve regurgitation is mild . No evidence of tricuspid stenosis. Aortic Valve: The aortic valve is tricuspid. Aortic valve regurgitation is not visualized. Aortic valve sclerosis is present, with no evidence of aortic valve stenosis. Aortic valve mean gradient measures 3.0 mmHg. Aortic valve peak gradient measures 7.0  mmHg. Aortic valve area, by VTI measures 2.55 cm. Pulmonic Valve: The pulmonic valve was normal in structure. Pulmonic valve regurgitation is not visualized. No evidence of pulmonic stenosis. Aorta: The aortic root is normal in size and structure. There is borderline dilatation of the aortic root, measuring 37 mm. Venous: The inferior vena cava is normal in size with greater than 50% respiratory variability, suggesting right atrial pressure of 3 mmHg. IAS/Shunts: No atrial level shunt detected by color flow Doppler.  LEFT VENTRICLE PLAX 2D LVIDd:         4.80 cm      Diastology LVIDs:         3.70 cm      LV e' medial:    4.68 cm/s LV PW:         1.50 cm      LV E/e' medial:  10.3 LV IVS:        1.20 cm      LV e' lateral:   5.22 cm/s LVOT diam:     2.10 cm      LV E/e' lateral: 9.2 LV SV:         66 LV SV Index:   34           2D Longitudinal Strain LVOT Area:     3.46 cm     2D Strain GLS Avg:     -8.9 %  LV Volumes (MOD) LV vol d, MOD A2C: 104.0 ml LV vol d, MOD A4C: 75.5 ml LV vol s, MOD A2C: 60.1 ml LV vol s, MOD A4C: 47.3 ml LV SV MOD A2C:     43.9 ml LV SV MOD A4C:     75.5 ml LV SV MOD BP:      38.4 ml RIGHT VENTRICLE RV Basal diam:  3.50 cm RV Mid diam:  3.50 cm RV S prime:     15.90 cm/s TAPSE (M-mode): 3.2 cm LEFT ATRIUM             Index        RIGHT ATRIUM           Index LA diam:        3.90 cm 2.03 cm/m   RA Area:     17.70 cm LA Vol (A2C):   67.7 ml 35.30 ml/m  RA Volume:   48.10 ml  25.08 ml/m LA Vol (A4C):   32.5 ml 16.94 ml/m LA Biplane Vol: 48.7 ml 25.39 ml/m  AORTIC VALVE                     PULMONIC VALVE AV Area (Vmax):    2.60 cm     PV Vmax:       0.98 m/s AV Area (Vmean):   2.54 cm     PV Peak grad:  3.9 mmHg AV Area (VTI):     2.55 cm AV Vmax:           132.00 cm/s AV Vmean:          84.700 cm/s AV VTI:            0.258 m AV Peak Grad:      7.0 mmHg AV Mean Grad:      3.0 mmHg LVOT Vmax:         99.00 cm/s LVOT Vmean:        62.200 cm/s LVOT VTI:          0.190 m LVOT/AV VTI ratio: 0.74  AORTA Ao Root diam: 3.70 cm MITRAL VALVE MV Area (PHT): 4.80 cm     SHUNTS MV Area VTI:   2.32 cm     Systemic VTI:  0.19 m MV Peak grad:  6.9 mmHg     Systemic Diam: 2.10 cm MV Mean grad:  2.0 mmHg MV Vmax:       1.31 m/s MV Vmean:      64.4 cm/s MV Decel Time: 158 msec MV E velocity: 48.20 cm/s MV A velocity: 121.00 cm/s MV E/A ratio:  0.40 Julien Nordmann MD Electronically signed by Julien Nordmann MD Signature Date/Time: 11/03/2022/4:07:01 PM    Final              LOS: 5 days       Sunnie Nielsen, DO [facility] 11/07/2022, 10:43 AM    Dictation software may have been used to generate the above note. Typos may occur and escape review in typed/dictated notes. Please contact Dr Lyn Hollingshead directly for clarity if needed.  Staff may message me via secure chat in Epic  but this may not receive an immediate response,  please page me for urgent matters!  If 7PM-7AM, please contact night coverage [email or web address]

## 2022-11-07 NOTE — Progress Notes (Signed)
Removed prevena wound vac from right groin without complication. Rolla Plate, NP at bedside. Dry guaze with tegaderm placed over site. At this time Doppler pulses of Bilateral PT and Right DP. Left DP absent.

## 2022-11-07 NOTE — Progress Notes (Signed)
Progress Note    11/07/2022 4:13 PM 1 Day Post-Op  Subjective:  Corey Olson is a 67 y.o. male with a history of stroke, peripheral atrial disease who presents with altered mental status, left leg weakness. Patient reports left leg weakness dragging his left foot while walking and decreased sensation, all of which were new but without clear timelines. 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   Patient is now postop day 4 from bilateral femoral endarterectomies with stent placements.  Is currently resting comfortably in ICU bed.  Prevena wound VAC to right groin is in place but not working well.  Patient has a JP drain to left groin with reported drainage from around the drain site which is now stopped.  Nursing reports patient's pulses are dopplerable both PT and DP.  Vitals all remained stable. Patient is currently getting a unit of blood for hemoglobin of 7.7.  Patient is also postop day 1 from a reexploration of the left groin for hematoma with JP drain placement.  Nursing reports 150 cc of serous fluid at the drain today.  Heparin was held earlier this morning we will continue to hold for another day.   Vitals:   11/07/22 1400 11/07/22 1500  BP:  (!) 113/53  Pulse: 90 83  Resp: 19 (!) 21  Temp:    SpO2: 94% 96%   Physical Exam: Cardiac:  RRR with Tachycardia present  Lungs: Normal pulmonary effort and no respiratory distress.  No wheezing rhonchi or rales noted. Incisions: Bilateral groin incisions with clean dry and intact dressings in place left groin with JP drain. Extremities: Bilateral lower extremities have Doppler DP and PT pulses.  No edema to lower extremities noted Abdomen: Positive bowel sounds throughout.  Soft, flat, nontender and nondistended. Neurologic: Patient is alert only.  He does not answer my questions this afternoon.  He does follow commands.  CBC    Component Value Date/Time   WBC 16.6 (H) 11/07/2022 0548   RBC 2.66 (L) 11/07/2022 0548   HGB 7.7 (L)  11/07/2022 0548   HCT 23.2 (L) 11/07/2022 0548   PLT 178 11/07/2022 0548   MCV 87.2 11/07/2022 0548   MCH 28.9 11/07/2022 0548   MCHC 33.2 11/07/2022 0548   RDW 14.9 11/07/2022 0548   LYMPHSABS 1.1 11/01/2022 1843   MONOABS 0.6 11/01/2022 1843   EOSABS 0.0 11/01/2022 1843   BASOSABS 0.0 11/01/2022 1843    BMET    Component Value Date/Time   NA 139 11/07/2022 0548   K 3.2 (L) 11/07/2022 0548   CL 107 11/07/2022 0548   CO2 23 11/07/2022 0548   GLUCOSE 130 (H) 11/07/2022 0548   BUN 14 11/07/2022 0548   CREATININE 0.95 11/07/2022 0548   CALCIUM 7.5 (L) 11/07/2022 0548   GFRNONAA >60 11/07/2022 0548    INR    Component Value Date/Time   INR 1.1 11/04/2022 0643     Intake/Output Summary (Last 24 hours) at 11/07/2022 1613 Last data filed at 11/07/2022 1500 Gross per 24 hour  Intake 3356 ml  Output 1330 ml  Net 2026 ml     Assessment/Plan:  67 y.o. male is s/p bilateral femoral endarterectomies with stent placement from 4 days ago and reexploration of the left groin for hematoma with Jackson-Pratt placement.  1 Day Post-Op   PLAN: Patient receiving 1 unit PRBC's for low HGB this morning.  Removed right groin Prevena Wound Vac as it was improperly working. Will plan to replace if dressing becomes soaked.  Continue to monitor bilateral lower extremity pulses. Report any major changes.   Heparin was held today due to JP output of 150 cc's. Will reassess in the morning.  Tylenol for pain. Avoid Narcotics if possible.   DVT prophylaxis:  Heparin Infusion now on hold    Marcie Bal [facility] 11/07/2022 4:13 PM

## 2022-11-07 NOTE — Progress Notes (Signed)
Op site dressing saturated again, while changing dressing noted that drainage appears to be coming from around the jp drain.

## 2022-11-07 NOTE — Progress Notes (Signed)
New op site dressing saturated, notified Dr. Lenell Antu he advised me to just change the dressing versus reinforcing. Changed dressing.

## 2022-11-08 ENCOUNTER — Encounter: Payer: Self-pay | Admitting: Vascular Surgery

## 2022-11-08 DIAGNOSIS — I998 Other disorder of circulatory system: Secondary | ICD-10-CM | POA: Diagnosis not present

## 2022-11-08 LAB — TYPE AND SCREEN: Unit division: 0

## 2022-11-08 LAB — CK: Total CK: 1797 U/L — ABNORMAL HIGH (ref 49–397)

## 2022-11-08 LAB — BPAM RBC: Blood Product Expiration Date: 202406192359

## 2022-11-08 LAB — APTT: aPTT: 28 seconds (ref 24–36)

## 2022-11-08 LAB — SURGICAL PATHOLOGY

## 2022-11-08 LAB — CULTURE, BLOOD (ROUTINE X 2): Special Requests: ADEQUATE

## 2022-11-08 LAB — PROTIME-INR
INR: 1.1 (ref 0.8–1.2)
Prothrombin Time: 14 seconds (ref 11.4–15.2)

## 2022-11-08 LAB — HEPARIN LEVEL (UNFRACTIONATED): Heparin Unfractionated: 0.16 IU/mL — ABNORMAL LOW (ref 0.30–0.70)

## 2022-11-08 MED ORDER — HEPARIN (PORCINE) 25000 UT/250ML-% IV SOLN
1400.0000 [IU]/h | INTRAVENOUS | Status: DC
  Administered 2022-11-08: 1300 [IU]/h via INTRAVENOUS
  Administered 2022-11-09: 1400 [IU]/h via INTRAVENOUS
  Filled 2022-11-08 (×2): qty 250

## 2022-11-08 MED ORDER — ORAL CARE MOUTH RINSE
15.0000 mL | OROMUCOSAL | Status: DC
  Administered 2022-11-08 – 2022-11-13 (×14): 15 mL via OROMUCOSAL

## 2022-11-08 MED ORDER — ORAL CARE MOUTH RINSE: 15.0000 mL | OROMUCOSAL | Status: DC | PRN

## 2022-11-08 NOTE — Progress Notes (Signed)
Patient belongings and wound vacs taken by NT and RN to Room 231.Heparin and NS administering while patient transported to 231. Report given to Rod RN. All questions answered. Vitals WDL prior and during transport. Additional wound changing items sent with patient.

## 2022-11-08 NOTE — Progress Notes (Signed)
[facility]  Daily Progress Note   Subjective  -   No pain.  No events overnight. Still unable to move or feel left foot due to prolonged ischemia prior to presentation.    Objective Vitals:   11/08/22 0300 11/08/22 0400 11/08/22 0500 11/08/22 0600  BP: (!) 150/72 (!) 145/88 133/86 136/82  Pulse: 98   88  Resp: 20 (!) 28 (!) 22 (!) 24  Temp:      TempSrc:      SpO2: 94%   95%  Weight:   76.1 kg   Height:        Intake/Output Summary (Last 24 hours) at 11/08/2022 0737 Last data filed at 11/08/2022 0600 Gross per 24 hour  Intake 3519.17 ml  Output 1180 ml  Net 2339.17 ml    PULM  CTAB CV  RRR VASC  Feet are warm, doppler signals present NEURO  no motor or sensory present in the left foot.  Laboratory CBC    Component Value Date/Time   WBC 16.6 (H) 11/07/2022 0548   HGB 9.2 (L) 11/07/2022 1702   HCT 26.8 (L) 11/07/2022 1702   PLT 178 11/07/2022 0548    BMET    Component Value Date/Time   NA 139 11/07/2022 0548   K 3.2 (L) 11/07/2022 0548   CL 107 11/07/2022 0548   CO2 23 11/07/2022 0548   GLUCOSE 130 (H) 11/07/2022 0548   BUN 14 11/07/2022 0548   CREATININE 0.95 11/07/2022 0548   CALCIUM 7.5 (L) 11/07/2022 0548   GFRNONAA >60 11/07/2022 0548    Assessment/Planning: POD #4 s/p bilateral femoral endarterectomies and iliac stents  Left leg still neurologically impaired from ischemia.  Unlikely to recover Ask PT/OT to evaluate Left leg may ultimately require amputation even after revascularization due to neurologic deficits. Very likely to need SNF placement at discharge   Corey Olson  11/08/2022, 7:37 AM

## 2022-11-08 NOTE — NC FL2 (Signed)
[place] MEDICAID [id number] LEVEL OF CARE FORM     IDENTIFICATION  Patient Name: Corey Olson Birthdate: 1956/05/19 Sex: male Admission Date (Current Location): 11/01/2022  [facility] [place] and [place] Number:      Facility and Address:  [facility], [address] [place], [place] [address]      Provider Number: [id number]  Attending Physician Name and Address:  Sunnie Nielsen, DO  Relative Name and Phone Number:  Claris Che (sister) [phone]    Current Level of Care: Hospital Recommended Level of Care: Skilled Nursing Facility Prior Approval Number:    Date Approved/Denied:   PASRR Number: [id number]  Discharge Plan: SNF    Current Diagnoses: Patient Active Problem List   Diagnosis Date Noted   Cerebrovascular accident (CVA) (HCC) 11/03/2022   Coronary artery disease of native artery of native heart with stable angina pectoris (HCC) 11/03/2022   Fall 11/02/2022   Ischemia of extremity 11/02/2022   Limb ischemia 11/02/2022   Fall at home, initial encounter 11/01/2022   Alcohol abuse 11/01/2022   RUE weakness 11/01/2022   Non-traumatic rhabdomyolysis 11/01/2022   Positive D dimer 11/01/2022   Abnormal EKG 11/01/2022   Atherosclerosis of native arteries of extremity with intermittent claudication (HCC) 09/07/2020   Renal artery stenosis (HCC) 09/07/2020   Essential hypertension 09/07/2020   Hyperlipidemia 09/07/2020   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   PAD (peripheral artery disease) (HCC) 07/17/2020   Primary osteoarthritis of right foot 07/09/2018   Chronic foot pain, right 06/18/2018   High risk medication use 06/18/2018   Personal history of gout 06/18/2018   Tobacco abuse 10/04/2016    Orientation RESPIRATION BLADDER Height & Weight     Self, Time, Situation, Place  Normal Incontinent, External catheter Weight: 167 lb 12.3 oz (76.1 kg) Height:  5\' 7"  (170.2 cm)  BEHAVIORAL SYMPTOMS/MOOD NEUROLOGICAL BOWEL NUTRITION STATUS       Continent Diet (see discharge summary)  AMBULATORY STATUS COMMUNICATION OF NEEDS Skin   Limited Assist Verbally Other (Comment) (abrasion right knee, incision left and right groin)                       Personal Care Assistance Level of Assistance  Bathing, Feeding, Dressing, Total care Bathing Assistance: Limited assistance Feeding assistance: Independent Dressing Assistance: Limited assistance Total Care Assistance: Limited assistance   Functional Limitations Info  Sight, Hearing, Speech Sight Info: Adequate Hearing Info: Adequate Speech Info: Adequate    SPECIAL CARE FACTORS FREQUENCY  PT (By licensed PT), OT (By licensed OT)     PT Frequency: min 4x weekly OT Frequency: min 4x weekly            Contractures Contractures Info: Not present    Additional Factors Info  Code Status, Allergies Code Status Info: full Allergies Info: NKA           Current Medications (11/08/2022):  This is the current hospital active medication list Current Facility-Administered Medications  Medication Dose Route Frequency Provider Last Rate Last Admin   0.9 %  sodium chloride infusion   Intravenous Continuous Annice Needy, MD 100 mL/hr at 11/08/22 1027 Infusion Verify at 11/08/22 1027   acetaminophen (TYLENOL) tablet 650 mg  650 mg Oral Q6H PRN Annice Needy, MD   650 mg at 11/07/22 8469   Or   acetaminophen (TYLENOL) suppository 650 mg  650 mg Rectal Q6H PRN Annice Needy, MD       aspirin EC tablet 81 mg  81 mg Oral Daily Annice Needy, MD   81 mg at 11/08/22 0848   atorvastatin (LIPITOR) tablet 80 mg  80 mg Oral QPM Annice Needy, MD   80 mg at 11/07/22 1756   Chlorhexidine Gluconate Cloth 2 % PADS 6 each  6 each Topical Daily Vida Rigger, MD   6 each at 11/07/22 2158   heparin ADULT infusion 100 units/mL (25000 units/240mL)  1,300 Units/hr Intravenous Continuous Mila Merry A, RPH 13 mL/hr at 11/08/22 1319 1,300 Units/hr at 11/08/22 1319   metoprolol succinate (TOPROL-XL)  24 hr tablet 50 mg  50 mg Oral Daily Annice Needy, MD   50 mg at 11/08/22 0848   nicotine (NICODERM CQ - dosed in mg/24 hours) patch 21 mg  21 mg Transdermal Daily Annice Needy, MD   21 mg at 11/08/22 0848   ondansetron (ZOFRAN) injection 4 mg  4 mg Intravenous Q6H PRN Marcie Bal, NP       Oral care mouth rinse  15 mL Mouth Rinse PRN Sunnie Nielsen, DO       Oral care mouth rinse  15 mL Mouth Rinse 4 times per day Sunnie Nielsen, DO   15 mL at 11/08/22 1206   Oral care mouth rinse  15 mL Mouth Rinse PRN Sunnie Nielsen, DO       pantoprazole (PROTONIX) EC tablet 40 mg  40 mg Oral BID Annice Needy, MD   40 mg at 11/08/22 0848   sodium chloride flush (NS) 0.9 % injection 3 mL  3 mL Intravenous Q12H Annice Needy, MD   3 mL at 11/07/22 2200   thiamine (VITAMIN B1) tablet 100 mg  100 mg Oral Daily Annice Needy, MD   100 mg at 11/08/22 0848     Discharge Medications: Please see discharge summary for a list of discharge medications.  Relevant Imaging Results:  Relevant Lab Results:   Additional Information SSN: [id number]  Darolyn Rua, LCSW

## 2022-11-08 NOTE — Progress Notes (Signed)
ANTICOAGULATION CONSULT NOTE  Pharmacy Consult for Heparin Infusion Indication: atrial fibrillation and DVT  Patient Measurements: Height: 5\' 7"  (170.2 cm) Weight: 76.1 kg (167 lb 12.3 oz) IBW/kg (Calculated) : 66.1 Heparin Dosing Weight: 76.1 kg  Labs: Recent Labs    11/06/22 0500 11/06/22 0511 11/06/22 0511 11/06/22 1317 11/07/22 0548 11/07/22 1702 11/08/22 0421 11/08/22 1306  HGB  --  8.2*   < > 8.6* 7.7* 9.2*  --   --   HCT  --  25.5*   < > 26.1* 23.2* 26.8*  --   --   PLT  --  177  --   --  178  --   --   --   APTT  --   --   --   --   --   --   --  28  LABPROT  --   --   --   --   --   --   --  14.0  INR  --   --   --   --   --   --   --  1.1  HEPARINUNFRC  --  0.31  --   --   --   --   --   --   CREATININE  --  1.02  --   --  0.95  --   --   --   CKTOTAL 4,484*  --   --   --   --   --  1,797*  --    < > = values in this interval not displayed.    Estimated Creatinine Clearance: 70.5 mL/min (by C-G formula based on SCr of 0.95 mg/dL).  Medical History: Past Medical History:  Diagnosis Date   Alcohol use disorder, mild, abuse    COPD (chronic obstructive pulmonary disease) (HCC)    Gout    Hyperlipidemia    Hypertension    Hypothyroid    Peripheral vascular complication    Stroke [facility])     Assessment: Pt is a 67 yo male presenting to ED for fall and c/o left leg pain, found with multiple arterial occlusions in lower extremities.Pt w/ med hx of Eliquis in the past, but stated he has not been taking medication recently. Patient is now POD4 s/p artery endarectomies, and angioplasty with stents on 11/04/22 for PAD. Also with DVT. Patient's hemoglobin dropped post-procedure and heparin infusion was discontinued. Hemoglobin has now stabilized. Pharmacy has been consulted to re-initiate and titrate heparin infusion.  Baseline labs: aPTT 28 sec, INR 1.1, Hgb 9.2, Plts 178  Date Time HL   Goal of Therapy:  Heparin level 0.3-0.7 units/ml Monitor platelets by  anticoagulation protocol: Yes   Plan:  --AVOID BOLUS CURRENTLY per vascular team request. Will assess daily when patient is appropriate for bolus dosing again. --Start heparin infusion at 1300 units/hr --Check HL in 6 hours after initiation --CBC daily  Bettey Costa, PharmD Clinical Pharmacist 11/08/2022 2:43 PM

## 2022-11-08 NOTE — Progress Notes (Signed)
Physical Therapy Treatment Patient Details Name: Corey Olson MRN: [id number] DOB: 10-12-1955 Today's Date: 11/08/2022   History of Present Illness Pt is a 67 yo male s/p bilateral femoral endarterectomy and iliac stenting. 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 PMH of COPD, gout, HLD, HTN, PVD, CVA, etoh.    PT Comments    Pt seen for PT tx with pt agreeable to tx. Pt pleasant, follows simple commands throughout session. Pt with impaired communication & ability to recall PLOF (pt reports he stayed in bed mostly but did transfer to w/c but question accuracy of this). PT requested case manager contact pt's sister to confirm PLOF. If pt was indeed at this level of mobility pt can d/c home with HHPT f/u, but at this time recommend post acute rehab <3 hours/day.  Session focused on BLE strengthening through exercises & STS. Pt is able to transfer STS x 3 with mod<>max assist but tolerates standing <15 seconds & requires BUE support.    Recommendations for follow up therapy are one component of a multi-disciplinary discharge planning process, led by the attending physician.  Recommendations may be updated based on patient status, additional functional criteria and insurance authorization.  Follow Up Recommendations  Can patient physically be transported by private vehicle: No    Assistance Recommended at Discharge Frequent or constant Supervision/Assistance  Patient can return home with the following Assistance with feeding;Help with stairs or ramp for entrance;Assistance with cooking/housework;Direct supervision/assist for medications management;Assist for transportation;A lot of help with walking and/or transfers;A lot of help with bathing/dressing/bathroom   Equipment Recommendations  None recommended by PT    Recommendations for Other Services        Precautions / Restrictions Precautions Precautions: Fall Precaution Comments: ?old R hemi Restrictions Weight Bearing Restrictions: No     Mobility  Bed Mobility               General bed mobility comments: not tested, pt received & left sitting in recline    Transfers   Equipment used: 1 person hand held assist   Sit to Stand: Mod assist, Max assist           General transfer comment: STS x 3 from recliner with cuing to scoot to edge of seat first, pt requires extra time to power up to standing still remains flexed forward. Unable to shift pelvis anterior & upright trunk as he still has to maintain LUE hand hold on chair armest. Tolerates standing <15 seconds each time 2/2 fatigue.    Ambulation/Gait                   Stairs             Wheelchair Mobility    Modified Rankin (Stroke Patients Only)       Balance Overall balance assessment: Needs assistance Sitting-balance support: Feet supported Sitting balance-Leahy Scale: Fair     Standing balance support: Single extremity supported, During functional activity Standing balance-Leahy Scale: Poor                              Cognition Arousal/Alertness: Awake/alert Behavior During Therapy: Flat affect Overall Cognitive Status: No family/caregiver present to determine baseline cognitive functioning  General Comments: Pt follows simple commands with extra time. ?Aphasia. Pt able to state his name & birthday but incorrect age (states he's 60 vs 58).        Exercises General Exercises - Lower Extremity Long Arc Quad: AROM, Seated, Strengthening, Both, 10 reps    General Comments        Pertinent Vitals/Pain Pain Assessment Pain Assessment: Faces Faces Pain Scale: No hurt    Home Living                          Prior Function            PT Goals (current goals can now be found in the care plan section)  Acute Rehab PT Goals PT Goal Formulation: Patient unable to participate in goal setting Time For Goal Achievement: 11/19/22 Potential to Achieve Goals: Fair Progress towards PT goals: Progressing toward goals    Frequency    Min 2X/week      PT Plan Current plan remains appropriate    Co-evaluation              AM-PAC PT "6 Clicks" Mobility   Outcome Measure  Help needed turning from your back to your side while in a flat bed without using bedrails?: A Lot Help needed moving from lying on your back to sitting on the side of a flat bed without using bedrails?: A Lot Help needed moving to and from a bed to a chair (including a wheelchair)?: A Lot Help needed standing up from a chair using your arms (e.g., wheelchair or bedside chair)?: A Lot Help needed to walk in hospital room?: Total Help needed climbing 3-5 steps with a railing? : Total 6 Click Score: 10    End of Session   Activity Tolerance: Patient tolerated treatment well;Patient limited by fatigue Patient left: in chair;with call bell/phone within reach;with chair alarm set Nurse Communication: Mobility status PT Visit Diagnosis: Other abnormalities of gait and mobility (R26.89);Muscle weakness (generalized) (M62.81);Difficulty in walking, not elsewhere classified (R26.2)     Time: 1610-9604 PT Time Calculation (min) (ACUTE ONLY): 12 min  Charges:  $Therapeutic Activity: 8-22 mins                     Aleda Grana, PT, DPT 11/08/22, 11:54 AM   Sandi Mariscal 11/08/2022, 11:51 AM

## 2022-11-08 NOTE — TOC Progression Note (Addendum)
Transition of Care [facility]) - Progression Note    Patient Details  Name: Jalijah Koopmann MRN: [id number] Date of Birth: 05-Dec-1955  Transition of Care [facility]) CM/SW Contact  Darolyn Rua, [place] Phone Number: 11/08/2022, 11:56 AM  Clinical Narrative:     Update: Patient's daughter [name] called back to report at baseline patient only used a cane and was completely independent, reports they are interested in SNF but she has to speak with Claris Che patient's siter. [place] lives in [place] and [place] lives in [place]. They need to decide where they would prefer SNF located, but are agreeable to referrals being sent in hub.      Per PT patient has difficulty verbalizing, interested in prior level of functioning.   CSW called both contacts listed in chart for patient's sister and daughter, no answer, lvm .        Expected Discharge Plan and Services                                               Social Determinants of Health (SDOH) Interventions SDOH Screenings   Food Insecurity: No Food Insecurity (11/02/2022)  Housing: Low Risk  (11/02/2022)  Transportation Needs: No Transportation Needs (11/02/2022)  Utilities: Not At Risk (11/02/2022)  Tobacco Use: High Risk (11/08/2022)    Readmission Risk Interventions     No data to display

## 2022-11-08 NOTE — Progress Notes (Signed)
Occupational Therapy Treatment Patient Details Name: Corey Olson MRN: [id number] DOB: [birth year] Today's Date: 11/08/2022   History of present illness Pt is a 67 yo male s/p bilateral femoral endarterectomy and iliac stenting. 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 PMH of COPD, gout, HLD, HTN, PVD, CVA, etoh.   OT comments  Pt seen for OT treatment on this date. Upon arrival to room pt resting in bed, agreeable to tx. Pt requires MIN A for bed mobility. MOD A +2 for RW management and physical support. Pt declined pain throughout session. Pt was able to perform legs raises and knee kicks sitting EOB. Pt reported appropriate deep pressure sensory input prior to transfer. Pt making good progress toward goals, will continue to follow POC. Discharge recommendation remains appropriate.     Recommendations for follow up therapy are one component of a multi-disciplinary discharge planning process, led by the attending physician.  Recommendations may be updated based on patient status, additional functional criteria and insurance authorization.    Assistance Recommended at Discharge Frequent or constant Supervision/Assistance  Patient can return home with the following  Two people to help with walking and/or transfers;Two people to help with bathing/dressing/bathroom;Assistance with cooking/housework;Direct supervision/assist for medications management;Direct supervision/assist for financial management;Assist for transportation;Help with stairs or ramp for entrance   Equipment Recommendations  Other (comment) (defer to next venue of care)    Recommendations for Other Services      Precautions / Restrictions Precautions Precautions: Fall Precaution Comments: watch BP Restrictions Weight Bearing Restrictions: No       Mobility Bed Mobility Overal bed mobility:  Needs Assistance Bed Mobility: Supine to Sit     Supine to sit: Min assist     General bed mobility comments: Hand held assist to come to EOB    Transfers Overall transfer level: Needs assistance Equipment used: Rolling walker (2 wheels) Transfers: Bed to chair/wheelchair/BSC       Step pivot transfers: Mod assist     General transfer comment: Heavy lean on LLE to move RLE. RW management required due to RUE weakness.     Balance Overall balance assessment: Needs assistance Sitting-balance support: Feet supported Sitting balance-Leahy Scale: Fair Sitting balance - Comments: Progressed to fair balance with assistance to achieve midline. Unable to maintain trunk balance while trying to kick feet out in front. Postural control: Posterior lean Standing balance support: Bilateral upper extremity supported Standing balance-Leahy Scale: Poor Standing balance comment: Pt stood EOB with MOD A +2                           ADL either performed or assessed with clinical judgement   ADL Overall ADL's : Needs assistance/impaired                         Toilet Transfer: Moderate assistance;Rolling walker (2 wheels) Toilet Transfer Details (indicate cue type and reason): Simulated         Functional mobility during ADLs: Moderate assistance;Rolling walker (2 wheels)      Extremity/Trunk Assessment Upper Extremity Assessment Upper Extremity Assessment: RUE deficits/detail RUE Deficits / Details: able to flex/extend R elbow, although tone noted and pt often holding R arm in flexed elbow position with activity.   Lower Extremity Assessment Lower Extremity Assessment: Generalized weakness  Vision       Perception     Praxis      Cognition Arousal/Alertness: Awake/alert Behavior During Therapy: Flat affect Overall Cognitive Status: No family/caregiver present to determine baseline cognitive functioning                                  General Comments: Presented as more alert today, able to follow single step commands well with minimal verbal prompting        Exercises      Shoulder Instructions       General Comments      Pertinent Vitals/ Pain       Pain Assessment Pain Assessment: No/denies pain  Home Living                                          Prior Functioning/Environment              Frequency  Min 1X/week        Progress Toward Goals  OT Goals(current goals can now be found in the care plan section)  Progress towards OT goals: Progressing toward goals  Acute Rehab OT Goals Patient Stated Goal: to feel better OT Goal Formulation: With patient Time For Goal Achievement: 11/19/22 Potential to Achieve Goals: Fair ADL Goals Pt Will Perform Eating: sitting;with supervision Pt Will Perform Grooming: with min assist;sitting Pt Will Transfer to Toilet: with min assist;bedside commode  Plan Discharge plan remains appropriate    Co-evaluation    PT/OT/SLP Co-Evaluation/Treatment: Yes            AM-PAC OT "6 Clicks" Daily Activity     Outcome Measure   Help from another person eating meals?: A Little Help from another person taking care of personal grooming?: A Lot Help from another person toileting, which includes using toliet, bedpan, or urinal?: A Lot Help from another person bathing (including washing, rinsing, drying)?: A Lot Help from another person to put on and taking off regular upper body clothing?: A Lot Help from another person to put on and taking off regular lower body clothing?: A Lot 6 Click Score: 13    End of Session Equipment Utilized During Treatment: Rolling walker (2 wheels);Gait belt  OT Visit Diagnosis: Other abnormalities of gait and mobility (R26.89);Muscle weakness (generalized) (M62.81)   Activity Tolerance Patient tolerated treatment well   Patient Left in chair;with chair alarm set;with call bell/phone within reach    Nurse Communication Mobility status        Time: 4098-1191 OT Time Calculation (min): 25 min  Charges: OT General Charges $OT Visit: 1 Visit OT Treatments $Therapeutic Activity: 23-37 mins [employer], OTS

## 2022-11-08 NOTE — Progress Notes (Signed)
PROGRESS NOTE    Corey Olson   ZOX:[id number] DOB: 1955/11/22  DOA: 11/01/2022 Date of Service: 11/08/22 PCP: [name], MD     Brief Narrative / Hospital Course:  Corey Olson is a 67 y.o. male with a history of stroke, peripheral atrial disease who presents with altered mental status, left leg weakness.  Family spoke to him at 430 and felt that he was confused.  Prior to that he had not been spoken to since the night before when he had seemed normal.  Patient reports left leg weakness dragging his left foot while walking and decreased sensation, all of which were new but without clear timelines. 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  06/11: in ED, VSS, code stroke. Admitted to hospitalist service. Elevated AST/ALT (+)hepatic steatosis, rhabdo, (+)DVT L femoral and popliteal V.  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 Found to have weakneed pulses LLE --> ischemia/severe PAD LLE, heparin gtt, RLE and LLE angiogram. Significant vascular disease, limb likely not salvageable per Dr Wyn Quaker - prognosis poor for even AKA healing, will likely need extensive endarterectomy/stenting to even salvage an AKA. Can plan for surgery 06/13: anticipate surgery tomorrow. Cardiac clearance.  06/14: endarterectomy/stents bilateral femoral  06/15: recovering in ICU on heparin infusion. Sinus tach w/ elevated temp, (+)SIRS, (+)UA concern for sepsis d/t UTI vs SIRS w/ other medical issues and also eval for PE was neg.  06/16: 1 unit PRBC, CT LLE (+)hematoma/pseudoaneurysm, to OR w/ Dr Lenell Antu for evacuation hematoma and repair venous branch bleed.  06/17: some bleeding around drain site overnight requiring few dressing changes, 1 unit PRBC ordered, hold heparin will monitor.  06/18: stable. Heparin restarted.   Consultants:  Neurology Vascular surgery   Cardiology   Procedures: 11/02/22 Bilateral LE angiogram w/ Dr Wyn Quaker  11/04/22 endarterectomy/stents bilateral femoral w/ Dr Wyn Quaker 11/06/22  evacuation of left groin hematoma and direct repair of venous branch bleeding w/ Dr Lenell Antu       ASSESSMENT & PLAN:   Principal Problem:   Fall Active Problems:   Fall at home, initial encounter   Tobacco abuse   PAD (peripheral artery disease) (HCC)   Essential hypertension   Alcohol abuse   RUE weakness   Non-traumatic rhabdomyolysis   Positive D dimer   Abnormal EKG   LLE w/ severe PAD, ischemic L foot w/ neurological deficits likely cause for fall S/p angiograpy 06/12 and endarterectomy/stenting 06/14 Left leg still neurologically impaired from ischemia. Unlikely to recover  Vascular surgery following Per Dr Wyn Quaker, LLE may require amputation even after revascularization due to neurologic deficits.  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 HTN HLD Previous Lexiscan w/o ischemia Per cardiology note 02/2021 "high risk, if he develops chest pain with exertion or dyspnea, will schedule left heart cath." Continuing on heparin infusion per vascular team  We were  holding statin given transaminitis but risk of holding may be greater than risk to liver, continue statin and will monitor LFT's closely  DVT L femoral and popliteal V  PE ruled out  Heparin gtt Vascular surgery following but no intervention needed at this time for the DVT     Rhabdomyolysis w/ normal renal function  IV fluids Follow renal function and CK   History CVA w/ residual R sided deficits (+)subacute R cerebellar punctate infarct on MRI ASA We were holding statin given transaminitis but risk of holding may be greater than risk to liver, will monitor LFT's closely Neurology following  On heparin gtt for DVT and ischemic leg  Transaminitis - improving  Hepatic Steatosis on RUQ [place] EtOH use but no acute intoxication based on blood levels - question alcoholic hepatitis  Follow CMP We were holding statin given transaminitis but risk of holding may be greater than risk to liver, will monitor LFT's closely  Weakness and fall Confusion / AMS question toxic metabolic encephalopathy or CVA Likely multifactorial Pressing concern for ischemic LLE as above w/ neuro deficits predisposing to fall  Concern for CVA see MRI brain Potential EtOH withdrawal  Treat multiple underlying issues  PT/OT as able Fall precaution   EtOH use but no acute intoxication based on blood levels  CIWA protocol  Thiamine  Hypokalemia Replace as needed Monitor BMP  Essential HTN lower BP to goal - window for permissive HTN w/ CVA has lapsed  Tobacco abuse Nicotine patch     DVT: treating w/ heparin  Pertinent IV fluids/nutrition: NS 100 mL/h Central lines / invasive devices: drain L groin  Code Status: FULL CODE ACP documentation reviewed: 11/02/22 none on file   Current Admission Status: inpatient  - Remain in stepdown for now, will leave there for today if heparin restarted, if no bleeding may consider transfer to progressive  TOC needs / Dispo plan: TBD expect will need SNF Barriers to discharge / significant pending items: possible amputation, currently on heparin              Subjective / Brief ROS:  Patient examined in ICU/stepdown Alert Denies chest pain, SOB or palpitations Alert, no concerns  (+)pain LLE about the same    Family  Communication: none at this time    Objective Findings:  Vitals:   11/08/22 0800 11/08/22 0900 11/08/22 1200 11/08/22 1423  BP: 135/80   (!) 133/57  Pulse: (!) 38 95  85  Resp: (!) 25 (!) 23  (!) 24  Temp: 98.8 F (37.1 C)  98.4 F (36.9 C) 98.2 F (36.8 C)  TempSrc: Axillary  Oral   SpO2: 93% 95% 97% 100%  Weight:      Height:        Intake/Output Summary (Last 24 hours) at 11/08/2022 1550 Last data filed at 11/08/2022 1300 Gross per 24 hour  Intake 2373.31 ml  Output 1990 ml  Net 383.31 ml   Filed Weights   11/04/22 0328 11/07/22 0500 11/08/22 0500  Weight: 71.2 kg 76.3 kg 76.1 kg    Examination:  Physical Exam Constitutional:      General: He is not in acute distress. Cardiovascular:     Rate and Rhythm: Normal rate and regular rhythm.  Pulmonary:     Effort: Pulmonary effort is normal. No respiratory distress.     Breath sounds: No rhonchi.  Abdominal:     General: There is no distension.     Palpations: Abdomen is soft.  Musculoskeletal:  Right lower leg: No edema.     Left lower leg: No edema.  Skin:    General: Skin is warm and dry.     Comments: LLE warmer, RN reports (+)doppler pulses   Neurological:     Mental Status: He is alert.  Psychiatric:        Behavior: Behavior normal.   L groin dressing appears clean, serosanguinous fluid in drain        Scheduled Medications:   aspirin EC  81 mg Oral Daily   atorvastatin  80 mg Oral QPM   Chlorhexidine Gluconate Cloth  6 each Topical Daily   metoprolol succinate  50 mg Oral Daily   nicotine  21 mg Transdermal Daily   mouth rinse  15 mL Mouth Rinse 4 times per day   pantoprazole  40 mg Oral BID   sodium chloride flush  3 mL Intravenous Q12H   thiamine  100 mg Oral Daily    Continuous Infusions:  sodium chloride 100 mL/hr at 11/08/22 1027   heparin 1,300 Units/hr (11/08/22 1319)    PRN Medications:  acetaminophen **OR** acetaminophen, ondansetron (ZOFRAN) IV, mouth rinse, mouth  rinse  Antimicrobials from admission:  Anti-infectives (From admission, onward)    Start     Dose/Rate Route Frequency Ordered Stop   11/05/22 1645  cefTRIAXone (ROCEPHIN) 1 g in sodium chloride 0.9 % 100 mL IVPB        1 g 200 mL/hr over 30 Minutes Intravenous Every 24 hours 11/05/22 1555 11/07/22 1653   11/04/22 0634  ceFAZolin (ANCEF) IVPB 2g/100 mL premix        2 g 200 mL/hr over 30 Minutes Intravenous 30 min pre-op 11/04/22 0634 11/04/22 1251   11/02/22 1431  ceFAZolin (ANCEF) IVPB 2g/100 mL premix        2 g 200 mL/hr over 30 Minutes Intravenous 30 min pre-op 11/02/22 1431 11/02/22 1653           Data Reviewed:  I have personally reviewed the following...  CBC: Recent Labs  Lab 11/01/22 1843 11/02/22 0625 11/03/22 0545 11/04/22 0643 11/05/22 0119 11/06/22 0511 11/06/22 1317 11/07/22 0548 11/07/22 1702  WBC 9.3   < > 10.2 12.2* 14.4* 13.8*  --  16.6*  --   NEUTROABS 7.5  --   --   --   --   --   --   --   --   HGB 14.5   < > 12.7* 12.7* 10.3* 8.2* 8.6* 7.7* 9.2*  HCT 44.9   < > 38.3* 38.6* 31.5* 25.5* 26.1* 23.2* 26.8*  MCV 90.3   < > 88.5 88.1 89.5 91.1  --  87.2  --   PLT 156   < > 148* 161 163 177  --  178  --    < > = values in this interval not displayed.   Basic Metabolic Panel: Recent Labs  Lab 11/01/22 2001 11/02/22 0625 11/03/22 0545 11/04/22 1610 11/05/22 0119 11/06/22 0511 11/07/22 0548  NA  --    < > 139 138 138 138 139  K  --    < > 3.0* 3.0* 3.5 3.3* 3.2*  CL  --    < > 106 103 106 105 107  CO2  --    < > 24 25 22 23 23   GLUCOSE  --    < > 106* 123* 130* 104* 130*  BUN  --    < > 11 9 11 12 14   CREATININE  --    < >  1.00 0.87 0.95 1.02 0.95  CALCIUM  --    < > 8.4* 8.5* 7.8* 7.8* 7.5*  MG 2.0  --   --   --   --   --   --    < > = values in this interval not displayed.   GFR: Estimated Creatinine Clearance: 70.5 mL/min (by C-G formula based on SCr of 0.95 mg/dL). Liver Function Tests: Recent Labs  Lab 11/01/22 1843  11/02/22 0625 11/03/22 0545 11/04/22 0643 11/05/22 0119  AST 296* 366* 408* 426* 243*  ALT 63* 74* 75* 80* 58*  ALKPHOS 55 57 49 49 38  BILITOT 1.3* 1.6* 0.9 0.7 1.3*  PROT 7.0 6.6 6.3* 6.7 6.2*  ALBUMIN 4.2 3.7 3.3* 3.5 3.6   Recent Labs  Lab 11/01/22 2001  LIPASE 27   No results for input(s): "AMMONIA" in the last 168 hours. Coagulation Profile: Recent Labs  Lab 11/01/22 1843 11/04/22 0643 11/08/22 1306  INR 1.0 1.1 1.1   Cardiac Enzymes: Recent Labs  Lab 11/01/22 2001 11/03/22 0545 11/06/22 0500 11/08/22 0421  CKTOTAL 14,578* 13,747* 4,484* 1,797*   BNP (last 3 results) No results for input(s): "PROBNP" in the last 8760 hours. HbA1C: No results for input(s): "HGBA1C" in the last 72 hours.  CBG: Recent Labs  Lab 11/01/22 1842 11/02/22 0215 11/04/22 1743  GLUCAP 91 84 142*   Lipid Profile: No results for input(s): "CHOL", "HDL", "LDLCALC", "TRIG", "CHOLHDL", "LDLDIRECT" in the last 72 hours.  Thyroid Function Tests: No results for input(s): "TSH", "T4TOTAL", "FREET4", "T3FREE", "THYROIDAB" in the last 72 hours. Anemia Panel: No results for input(s): "VITAMINB12", "FOLATE", "FERRITIN", "TIBC", "IRON", "RETICCTPCT" in the last 72 hours. Most Recent Urinalysis On File:     Component Value Date/Time   COLORURINE YELLOW (A) 11/05/2022 1450   APPEARANCEUR CLOUDY (A) 11/05/2022 1450   LABSPEC 1.029 11/05/2022 1450   PHURINE 5.0 11/05/2022 1450   GLUCOSEU NEGATIVE 11/05/2022 1450   HGBUR LARGE (A) 11/05/2022 1450   BILIRUBINUR NEGATIVE 11/05/2022 1450   KETONESUR 5 (A) 11/05/2022 1450   PROTEINUR 100 (A) 11/05/2022 1450   NITRITE NEGATIVE 11/05/2022 1450   LEUKOCYTESUR MODERATE (A) 11/05/2022 1450   Sepsis Labs: @LABRCNTIP (procalcitonin:4,lacticidven:4) Microbiology: Recent Results (from the past 240 hour(s))  Urine Culture (for pregnant, neutropenic or urologic patients or patients with an indwelling urinary catheter)     Status: None   Collection  Time: 11/05/22  2:50 PM   Specimen: Urine, Clean Catch  Result Value Ref Range Status   Specimen Description   Final    URINE, CLEAN CATCH Performed at [facility], [address] [place], [place] [address]    Special Requests   Final    NONE Performed at [facility], [address], [place], [place] [address]    Culture   Final    NO GROWTH Performed at [facility], [address] [place], [place] [address]    Report Status 11/07/2022 FINAL  Final  Culture, blood (Routine X 2) w Reflex to ID Panel     Status: None (Preliminary result)   Collection Time: 11/05/22  3:26 PM   Specimen: BLOOD  Result Value Ref Range Status   Specimen Description BLOOD BLOOD RIGHT HAND  Final   Special Requests   Final    BOTTLES DRAWN AEROBIC AND ANAEROBIC Blood Culture adequate volume   Culture   Final    NO GROWTH 3 DAYS Performed at [facility], [address] [place],  [place] [address]    Report Status PENDING  Incomplete  Culture, blood (Routine X 2) w Reflex to ID Panel     Status: None (Preliminary result)   Collection Time: 11/05/22  3:29 PM   Specimen: BLOOD  Result Value Ref Range Status   Specimen Description BLOOD BLOOD LEFT HAND  Final   Special Requests   Final    BOTTLES DRAWN AEROBIC AND ANAEROBIC Blood Culture adequate volume   Culture   Final    NO GROWTH 3 DAYS Performed at [facility], [address], [place], [place] [address]    Report Status PENDING  Incomplete      Radiology Studies last 3 days: CT ANGIO AO+BIFEM W & OR WO CONTRAST  Addendum Date: 11/06/2022   ADDENDUM REPORT: 11/06/2022 11:10 ADDENDUM: Laterality error in impression #1,  which should read: 1. 6.8 cm hematoma in the deep subcutaneous tissues overlying the LEFT SFA, with possible pseudoaneurysm. Electronically Signed   By: Corlis Leak M.D.   On: 11/06/2022 11:10   Result Date: 11/06/2022 CLINICAL DATA:  Soft tissue mass deep thigh EXAM: CT  ANGIOGRAPHY OF ABDOMINAL AORTA WITH ILIOFEMORAL RUNOFF TECHNIQUE: Multidetector CT imaging of the abdomen, pelvis and lower extremities was performed using the standard protocol during bolus administration of intravenous contrast. Multiplanar CT image reconstructions and MIPs were obtained to evaluate the vascular anatomy. RADIATION DOSE REDUCTION: This exam was performed according to the departmental dose-optimization program which includes automated exposure control, adjustment of the mA and/or kV according to patient size and/or use of iterative reconstruction technique. CONTRAST:  OMNIPAQUE IOHEXOL 350 MG/ML SOLN COMPARISON:  11/02/2022 FINDINGS: VASCULAR Aorta: Moderate calcified atheromatous plaque. No aneurysm, dissection, or significant stenosis. Eccentric intraluminal thrombus in the infrarenal segment without high-grade stenosis as before, an a embolic risk. Celiac: Common origin with SMA. There is heavy calcified plaque with mild ostial stenosis of doubtful hemodynamic significance. SMA: Common origin with the celiac axis. Mild origin stenosis, and scattered calcified atheromatous plaque distally resulting in tandem areas of stenosis. Renals: Single right, with heavily calcified ostial plaque over length of approximately 1.2 cm resulting in at least mild stenosis, patent distally. Single left, with heavily calcified ostial plaque over length of 1.6 cm resulting in stenosis of at least moderate severity, atheromatous but patent distally. IMA: Patent, diminutive RIGHT Lower Extremity Inflow: Common iliac heavily calcified plaque. Patent stent in across its bifurcation. Internal iliac origin occlusion External iliac heavily atheromatous but patent with overlapping stents through its length. Outflow: Common femoral patent. Deep femoral branches patent. SFA long segment occlusion from just beyond its origin through its length. Popliteal is atheromatous, with a small patent channel reconstituted by  collaterals. Runoff: Heavily calcified proximally, limiting luminal evaluation. There is enhancement of the distal anterior tibial artery crossing the ankle as dorsalis pedis. The peroneal artery is opacified just above the calf. LEFT Lower Extremity Inflow: Common iliac heavily atheromatous but now patent, stented through its length. Internal iliac long segment origin occlusion. External iliac atheromatous, stented and now patent through its length Outflow: Common femoral patent. Deep femoral branches patent. SFA long segment occlusion through its length. Popliteal long segment occlusion from its origin. There is collateral reconstitution of a small patent channel below the knee. Runoff: Scattered calcified atheromatous plaque. Anterior and posterior tibial arteries occlude proximal calf. Peroneal artery is diminutive but appears patent to just above the ankle. Veins: No obvious venous abnormality within the limitations of this arterial phase study. Review of the MIP  images confirms the above findings. NON-VASCULAR Lower chest: No pleural or pericardial effusion. Coronary calcifications. Hepatobiliary: Multiple small cysts as before. No biliary ductal dilatation. Gallbladder incompletely distended. Pancreas: Unremarkable. No pancreatic ductal dilatation or surrounding inflammatory changes. Spleen: Normal in size without focal abnormality. Adrenals/Urinary Tract: No adrenal mass. Symmetric renal parenchymal enhancement without hydronephrosis or worrisome lesion. Urinary bladder partially decompressed. Foley catheter balloon has been inflated within a small anterior diverticulum, tip isolated from lumen of urinary bladder. Stomach/Bowel: Stomach is nondistended. Small bowel decompressed. Normal appendix. Colon partially distended by gas and fecal material with scattered diverticula from the descending and proximal sigmoid segments. Mild inflammatory changes posterior to the distal descending colon (Im121,Se6) without  abscess. Lymphatic: No abdominal or pelvic adenopathy. Reproductive: Prostate enlargement with central coarse calcifications. Other: No ascites.  No free air. Musculoskeletal: 5.3 cm hematoma overlying proximal right SFA. 6.8 cm hematoma in the deep subcutaneous tissues overlying proximal right SFA, with possible enhancing neck suggesting pseudoaneurysm. Postop changes and skin staples overlying bilateral common femoral vessels. Left knee effusion. Left thigh and calf soft tissue swelling with subcutaneous edematous change. Regional bones unremarkable. IMPRESSION: 1. 6.8 cm hematoma in the deep subcutaneous tissues overlying proximal right SFA, with possible enhancing neck suggesting pseudoaneurysm. 2. 5.3 cm hematoma overlying proximal right SFA. 3. Restoration of flow through the left common and external iliac arteries. 4. Bilateral long segment SFA and popliteal artery occlusions with collateral reconstitution of a small patent channel below the knee. 5. Foley catheter balloon malpositioned within a small anterior bladder diverticulum, tip isolated from lumen of urinary bladder. Recommend repositioning. 6. Descending and sigmoid diverticulosis with mild inflammatory changes posterior to the distal descending colon, possible mild diverticulitis. 7.  Aortic Atherosclerosis (ICD10-I70.0). Electronically Signed: By: Corlis Leak M.D. On: 11/06/2022 08:20   DG Chest Port 1 View  Result Date: 11/05/2022 CLINICAL DATA:  Abnormal breath sounds EXAM: PORTABLE CHEST 1 VIEW COMPARISON:  09/10/2020 FINDINGS: Transverse diameter of heart is increased. Thoracic aorta is tortuous and ectatic. There are no signs of pulmonary edema more focal pulmonary consolidation. There is no pleural effusion or pneumothorax. IMPRESSION: There are no signs of pulmonary edema or focal pulmonary consolidation. Electronically Signed   By: Ernie Avena M.D.   On: 11/05/2022 17:19   CT Angio Chest Pulmonary Embolism (PE) W or WO  Contrast  Result Date: 11/05/2022 CLINICAL DATA:  High clinical suspicion for PE EXAM: CT ANGIOGRAPHY CHEST WITH CONTRAST TECHNIQUE: Multidetector CT imaging of the chest was performed using the standard protocol during bolus administration of intravenous contrast. Multiplanar CT image reconstructions and MIPs were obtained to evaluate the vascular anatomy. RADIATION DOSE REDUCTION: This exam was performed according to the departmental dose-optimization program which includes automated exposure control, adjustment of the mA and/or kV according to patient size and/or use of iterative reconstruction technique. CONTRAST:  75mL OMNIPAQUE IOHEXOL 350 MG/ML SOLN COMPARISON:  Noncontrast CT chest done on 08/05/2020 and 11/02/2022 FINDINGS: Cardiovascular: There are no intraluminal filling defects in pulmonary artery branches. There is homogeneous enhancement in thoracic aorta. Atherosclerotic plaques and calcifications are seen in thoracic aorta and its major branches. Coronary artery calcifications are seen. Minimal pericardial effusion is present. Mediastinum/Nodes: No significant lymphadenopathy is seen. Lungs/Pleura: Small patchy densities are seen in posterior right apical region. Similar finding was seen in the previous examination. There are small linear densities in both apices. There is no new focal consolidation. There is no pleural effusion or pneumothorax. Upper Abdomen: There is high density in the  dependent portion of gallbladder lumen suggesting possible gallbladder stones. There is no dilation of bile ducts. Numerous scattered small low-density foci in liver with no significant change. Fairly extensive arterial calcifications are seen. There is 2 mm calcific density in the upper pole of left kidney suggesting possible small nonobstructing stone. There is 2.7 cm cyst in the upper pole of right kidney. There are other small subcentimeter low-density foci in the right kidney. Musculoskeletal: No acute  findings are seen. Review of the MIP images confirms the above findings. IMPRESSION: There is no evidence of pulmonary artery embolism. There is no evidence of thoracic aortic dissection. Small focus of increased markings in the posterior right apical region has not changed significantly, possibly scarring. There is no focal pulmonary consolidation. There is no pleural effusion. Coronary artery disease. Aortic arteriosclerosis. Possible gallbladder stones. There are numerous small low-density foci in the liver with no significant interval change, possibly cysts. 2 mm left renal stone. There are right renal cysts measuring up to 2.7 cm. Electronically Signed   By: Ernie Avena M.D.   On: 11/05/2022 17:18             LOS: 6 days       Sunnie Nielsen, DO [facility] 11/08/2022, 3:50 PM    Dictation software may have been used to generate the above note. Typos may occur and escape review in typed/dictated notes. Please contact Dr Lyn Hollingshead directly for clarity if needed.  Staff may message me via secure chat in Epic  but this may not receive an immediate response,  please page me for urgent matters!  If 7PM-7AM, please contact night coverage [email or web address]

## 2022-11-08 NOTE — Progress Notes (Signed)
ANTICOAGULATION CONSULT NOTE  Pharmacy Consult for Heparin Infusion Indication: atrial fibrillation and DVT  Patient Measurements: Height: 5\' 7"  (170.2 cm) Weight: 76.1 kg (167 lb 12.3 oz) IBW/kg (Calculated) : 66.1 Heparin Dosing Weight: 76.1 kg  Labs: Recent Labs    11/06/22 0500 11/06/22 0511 11/06/22 0511 11/06/22 1317 11/07/22 0548 11/07/22 1702 11/08/22 0421 11/08/22 1306 11/08/22 1922  HGB  --  8.2*   < > 8.6* 7.7* 9.2*  --   --   --   HCT  --  25.5*   < > 26.1* 23.2* 26.8*  --   --   --   PLT  --  177  --   --  178  --   --   --   --   APTT  --   --   --   --   --   --   --  28  --   LABPROT  --   --   --   --   --   --   --  14.0  --   INR  --   --   --   --   --   --   --  1.1  --   HEPARINUNFRC  --  0.31  --   --   --   --   --   --  0.16*  CREATININE  --  1.02  --   --  0.95  --   --   --   --   CKTOTAL 4,484*  --   --   --   --   --  1,797*  --   --    < > = values in this interval not displayed.    Estimated Creatinine Clearance: 70.5 mL/min (by C-G formula based on SCr of 0.95 mg/dL).  Medical History: Past Medical History:  Diagnosis Date   Alcohol use disorder, mild, abuse    COPD (chronic obstructive pulmonary disease) (HCC)    Gout    Hyperlipidemia    Hypertension    Hypothyroid    Peripheral vascular complication    Stroke [facility])     Assessment: Pt is a 67 yo male presenting to ED for fall and c/o left leg pain, found with multiple arterial occlusions in lower extremities.Pt w/ med hx of Eliquis in the past, but stated he has not been taking medication recently. Patient is now POD4 s/p artery endarectomies, and angioplasty with stents on 11/04/22 for PAD. Also with DVT. Patient's hemoglobin dropped post-procedure and heparin infusion was discontinued. Hemoglobin has now stabilized. Pharmacy has been consulted to re-initiate and titrate heparin infusion.  Baseline labs: aPTT 28 sec, INR 1.1, Hgb 9.2, Plts 178  Goal of Therapy:  Heparin level  0.3-0.7 units/ml Monitor platelets by anticoagulation protocol: Yes   Date Time HL 61/18 1922 HL = 0.16, subtherapeutic @ 1300 un/hr   Plan:  --AVOID BOLUS CURRENTLY per vascular team request. Will assess daily when patient is appropriate for bolus dosing again. --Increase heparin infusion rate to 1400 units/hr --Check HL in 6 hours after rate change --CBC daily  Katelinn Justice Rodriguez-Guzman PharmD, BCPS 11/08/2022 8:15 PM

## 2022-11-09 ENCOUNTER — Encounter: Payer: Self-pay | Admitting: Vascular Surgery

## 2022-11-09 DIAGNOSIS — I998 Other disorder of circulatory system: Secondary | ICD-10-CM | POA: Diagnosis not present

## 2022-11-09 DIAGNOSIS — F1010 Alcohol abuse, uncomplicated: Secondary | ICD-10-CM | POA: Diagnosis not present

## 2022-11-09 DIAGNOSIS — W19XXXA Unspecified fall, initial encounter: Secondary | ICD-10-CM | POA: Diagnosis not present

## 2022-11-09 DIAGNOSIS — R9431 Abnormal electrocardiogram [ECG] [EKG]: Secondary | ICD-10-CM | POA: Diagnosis not present

## 2022-11-09 LAB — COMPREHENSIVE METABOLIC PANEL
ALT: 134 U/L — ABNORMAL HIGH (ref 0–44)
AST: 161 U/L — ABNORMAL HIGH (ref 15–41)
Albumin: 2.6 g/dL — ABNORMAL LOW (ref 3.5–5.0)
Alkaline Phosphatase: 53 U/L (ref 38–126)
Anion gap: 10 (ref 5–15)
BUN: 13 mg/dL (ref 8–23)
CO2: 24 mmol/L (ref 22–32)
Calcium: 7.3 mg/dL — ABNORMAL LOW (ref 8.9–10.3)
Chloride: 103 mmol/L (ref 98–111)
Creatinine, Ser: 1 mg/dL (ref 0.61–1.24)
GFR, Estimated: >60 mL/min (ref >60)
Glucose, Bld: 102 mg/dL — ABNORMAL HIGH (ref 70–99)
Potassium: 2.8 mmol/L — ABNORMAL LOW (ref 3.5–5.1)
Sodium: 137 mmol/L (ref 135–145)
Total Bilirubin: 0.9 mg/dL (ref 0.3–1.2)
Total Protein: 5.6 g/dL — ABNORMAL LOW (ref 6.5–8.1)

## 2022-11-09 LAB — HEPARIN LEVEL (UNFRACTIONATED)
Heparin Unfractionated: 0.61 IU/mL (ref 0.30–0.70)
Heparin Unfractionated: 0.67 IU/mL (ref 0.30–0.70)

## 2022-11-09 LAB — CBC
HCT: 26.9 % — ABNORMAL LOW (ref 39.0–52.0)
Hemoglobin: 9.1 g/dL — ABNORMAL LOW (ref 13.0–17.0)
MCH: 29.1 pg (ref 26.0–34.0)
MCHC: 33.8 g/dL (ref 30.0–36.0)
MCV: 85.9 fL (ref 80.0–100.0)
Platelets: 246 10*3/uL (ref 150–400)
RBC: 3.13 MIL/uL — ABNORMAL LOW (ref 4.22–5.81)
RDW: 15 % (ref 11.5–15.5)
WBC: 13.5 10*3/uL — ABNORMAL HIGH (ref 4.0–10.5)
nRBC: 0.1 % (ref 0.0–0.2)

## 2022-11-09 LAB — CULTURE, BLOOD (ROUTINE X 2)

## 2022-11-09 MED ORDER — ORAL CARE MOUTH RINSE: 15.0000 mL | OROMUCOSAL | Status: DC | PRN

## 2022-11-09 MED ORDER — APIXABAN 5 MG PO TABS: 5.0000 mg | ORAL_TABLET | Freq: Two times a day (BID) | ORAL | Status: DC

## 2022-11-09 MED ORDER — APIXABAN 5 MG PO TABS
10.0000 mg | ORAL_TABLET | Freq: Two times a day (BID) | ORAL | Status: DC
  Administered 2022-11-09 – 2022-11-10 (×2): 10 mg via ORAL
  Filled 2022-11-09 (×2): qty 2

## 2022-11-09 MED ORDER — POTASSIUM CHLORIDE 10 MEQ/100ML IV SOLN
10.0000 meq | INTRAVENOUS | Status: AC
  Administered 2022-11-09 (×4): 10 meq via INTRAVENOUS
  Filled 2022-11-09 (×4): qty 100

## 2022-11-09 MED ORDER — POTASSIUM CHLORIDE CRYS ER 20 MEQ PO TBCR
40.0000 meq | EXTENDED_RELEASE_TABLET | Freq: Once | ORAL | Status: AC
  Administered 2022-11-09: 40 meq via ORAL
  Filled 2022-11-09: qty 2

## 2022-11-09 NOTE — Progress Notes (Signed)
ANTICOAGULATION CONSULT NOTE  Pharmacy Consult for Heparin Infusion Indication: atrial fibrillation and DVT  Patient Measurements: Height: 5\' 7"  (170.2 cm) Weight: 76.1 kg (167 lb 12.3 oz) IBW/kg (Calculated) : 66.1 Heparin Dosing Weight: 76.1 kg  Labs: Recent Labs    11/07/22 0548 11/07/22 1702 11/08/22 0421 11/08/22 1306 11/08/22 1922 11/09/22 0223 11/09/22 0750  HGB 7.7* 9.2*  --   --   --  9.1*  --   HCT 23.2* 26.8*  --   --   --  26.9*  --   PLT 178  --   --   --   --  246  --   APTT  --   --   --  28  --   --   --   LABPROT  --   --   --  14.0  --   --   --   INR  --   --   --  1.1  --   --   --   HEPARINUNFRC  --   --   --   --  0.16* 0.61 0.67  CREATININE 0.95  --   --   --   --  1.00  --   CKTOTAL  --   --  1,797*  --   --   --   --     Estimated Creatinine Clearance: 67 mL/min (by C-G formula based on SCr of 1 mg/dL).  Medical History: Past Medical History:  Diagnosis Date   Alcohol use disorder, mild, abuse    COPD (chronic obstructive pulmonary disease) (HCC)    Gout    Hyperlipidemia    Hypertension    Hypothyroid    Peripheral vascular complication    Stroke [facility])     Assessment: Pt is a 67 yo male presenting to ED for fall and c/o left leg pain, found with multiple arterial occlusions in lower extremities.Pt w/ med hx of Eliquis in the past, but stated he has not been taking medication recently. Patient is now POD4 s/p artery endarectomies, and angioplasty with stents on 11/04/22 for PAD. Also with DVT. Patient's hemoglobin dropped post-procedure and heparin infusion was discontinued. Hemoglobin has now stabilized. Pharmacy has been consulted to transition to apixaban.  Baseline labs: aPTT 28 sec, INR 1.1, Hgb 9.2, Plts 178  Goal of Therapy:  Heparin level 0.3-0.7 units/ml Monitor platelets by anticoagulation protocol: Yes   Date Time HL 6/18 1922 HL = 0.16, subtherapeutic @ 1300 un/hr 6/19     0223    HL =  0.61, therapeutic X 1  6/19 0750 HL  = 0.67   Plan:  Discontinue IV heparin and give apixaban (Eliquis) 10mg  Apixaban 10mg  BID x 7 days followed by 5 mg BID therafter  [name], PharmD, BCPS Clinical Pharmacist  11/09/2022 4:30 PM

## 2022-11-09 NOTE — Progress Notes (Signed)
Progress Note    11/09/2022 3:35 PM 3 Days Post-Op  Subjective:   Corey Olson is a 67 y.o. male with a history of stroke, peripheral atrial disease who presents with altered mental status, left leg weakness. Patient reports left leg weakness dragging his left foot while walking and decreased sensation, all of which were new but without clear timelines. 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   Patient is now 5 days postop from bilateral femoral endarterectomies with stent placements.  He is currently resting comfortably in bed now on the floor.  He is noted to have dry dressings on his right groin with some noted drainage.  Is also noted to have gauze bandage on his left groin with JP drain in place.  JP drain is full this morning on exam.  Patient has left upper extremity hand and admit to keep from pulling his IV lines out and his dressings off.  Patient endorses pain to both groins.  No other complaints at this time.  Vitals all stable.   Vitals:   11/09/22 1126 11/09/22 1128  BP: (!) 119/105 (!) 143/77  Pulse: 96 95  Resp: 18   Temp: 98.7 F (37.1 C)   SpO2: 100% 100%   Physical Exam: Cardiac:  RRR with Tachycardia present  Lungs: Normal pulmonary effort and no respiratory distress.  No wheezing rhonchi or rales noted. Incisions: Bilateral groin incisions with clean dry and intact dressings in place left groin with JP drain. Extremities: Bilateral lower extremities have Doppler DP and PT pulses.  No edema to lower extremities noted Abdomen: Positive bowel sounds throughout.  Soft, flat, nontender and nondistended. Neurologic: Patient is alert only.  He does not answer my questions this afternoon.  He does follow commands.  CBC    Component Value Date/Time   WBC 13.5 (H) 11/09/2022 0223   RBC 3.13 (L) 11/09/2022 0223   HGB 9.1 (L) 11/09/2022 0223   HCT 26.9 (L) 11/09/2022 0223   PLT 246 11/09/2022 0223   MCV 85.9 11/09/2022 0223   MCH 29.1 11/09/2022 0223   MCHC 33.8  11/09/2022 0223   RDW 15.0 11/09/2022 0223   LYMPHSABS 1.1 11/01/2022 1843   MONOABS 0.6 11/01/2022 1843   EOSABS 0.0 11/01/2022 1843   BASOSABS 0.0 11/01/2022 1843    BMET    Component Value Date/Time   NA 137 11/09/2022 0223   K 2.8 (L) 11/09/2022 0223   CL 103 11/09/2022 0223   CO2 24 11/09/2022 0223   GLUCOSE 102 (H) 11/09/2022 0223   BUN 13 11/09/2022 0223   CREATININE 1.00 11/09/2022 0223   CALCIUM 7.3 (L) 11/09/2022 0223   GFRNONAA >60 11/09/2022 0223    INR    Component Value Date/Time   INR 1.1 11/08/2022 1306     Intake/Output Summary (Last 24 hours) at 11/09/2022 1535 Last data filed at 11/09/2022 1200 Gross per 24 hour  Intake 2227.37 ml  Output 2660 ml  Net -432.63 ml     Assessment/Plan:  67 y.o. male is s/p bilateral femoral endarterectomies with stent placement from 5 days ago and reexploration of the left groin for hematoma with Jackson-Pratt placement.  3 Days Post-Op   PLAN Bilateral groin incision dressings were changed back to prevena wound vacs today due to noted increased drainage to both the guaze dressing on the right groin and increased drainage of serous fluid to the left groin.  Al Pimple Drain removed today.  Tylenol for pain and avoid narcotics if possible.  Okay to stop Heparin Infusion. Patient may now be converted to to oral anticoagulation. Patient with history of DVT and because of his bilateral lower extremity weakness from prior CVA causing him to be very sedentary will need to be on Eliquis  5 mg twice daily and ASA 81 mg daily.   DVT prophylaxis:  Heparin Infusion to be converted to oral anticoagulation.    Marcie [facility] 11/09/2022 3:35 PM

## 2022-11-09 NOTE — Progress Notes (Signed)
ANTICOAGULATION CONSULT NOTE  Pharmacy Consult for Heparin Infusion Indication: atrial fibrillation and DVT  Patient Measurements: Height: 5\' 7"  (170.2 cm) Weight: 76.1 kg (167 lb 12.3 oz) IBW/kg (Calculated) : 66.1 Heparin Dosing Weight: 76.1 kg  Labs: Recent Labs    11/07/22 0548 11/07/22 1702 11/08/22 0421 11/08/22 1306 11/08/22 1922 11/09/22 0223 11/09/22 0750  HGB 7.7* 9.2*  --   --   --  9.1*  --   HCT 23.2* 26.8*  --   --   --  26.9*  --   PLT 178  --   --   --   --  246  --   APTT  --   --   --  28  --   --   --   LABPROT  --   --   --  14.0  --   --   --   INR  --   --   --  1.1  --   --   --   HEPARINUNFRC  --   --   --   --  0.16* 0.61 0.67  CREATININE 0.95  --   --   --   --  1.00  --   CKTOTAL  --   --  1,797*  --   --   --   --     Estimated Creatinine Clearance: 67 mL/min (by C-G formula based on SCr of 1 mg/dL).  Medical History: Past Medical History:  Diagnosis Date   Alcohol use disorder, mild, abuse    COPD (chronic obstructive pulmonary disease) (HCC)    Gout    Hyperlipidemia    Hypertension    Hypothyroid    Peripheral vascular complication    Stroke [facility])     Assessment: Pt is a 67 yo male presenting to ED for fall and c/o left leg pain, found with multiple arterial occlusions in lower extremities.Pt w/ med hx of Eliquis in the past, but stated he has not been taking medication recently. Patient is now POD4 s/p artery endarectomies, and angioplasty with stents on 11/04/22 for PAD. Also with DVT. Patient's hemoglobin dropped post-procedure and heparin infusion was discontinued. Hemoglobin has now stabilized. Pharmacy has been consulted to re-initiate and titrate heparin infusion.  Baseline labs: aPTT 28 sec, INR 1.1, Hgb 9.2, Plts 178  Goal of Therapy:  Heparin level 0.3-0.7 units/ml Monitor platelets by anticoagulation protocol: Yes   Date Time HL 6/18 1922 HL = 0.16, subtherapeutic @ 1300 un/hr 6/19     0223    HL =  0.61, therapeutic X  1  6/19 0750 HL = 0.67    Plan:  AVOID BOLUS CURRENTLY per vascular team request. Will assess daily when patient is appropriate for bolus dosing again.  Heparin level is therapeutic. Will continue heparin at 1400 units/hr. Recheck heparin level and CBC with AM labs.   Ronnald Ramp, PharmD, BCPS 11/09/2022 8:52 AM

## 2022-11-09 NOTE — Progress Notes (Signed)
PROGRESS NOTE    Corey Olson   WUJ:[id number] DOB: 12-03-1955  DOA: 11/01/2022 Date of Service: 11/09/22 PCP: [name], MD     Brief Narrative / Hospital Course:  Corey Olson is a 67 y.o. male with a history of stroke, peripheral atrial disease who presents with altered mental status, left leg weakness.  Family spoke to him at 430 and felt that he was confused.  Prior to that he had not been spoken to since the night before when he had seemed normal.  Patient reports left leg weakness dragging his left foot while walking and decreased sensation, all of which were new but without clear timelines. 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  06/11: in ED, VSS, code stroke. Admitted to hospitalist service. Elevated AST/ALT (+)hepatic steatosis, rhabdo, (+)DVT L femoral and popliteal V.  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 Found to have weakneed pulses LLE --> ischemia/severe PAD LLE, heparin gtt, RLE and LLE angiogram. Significant vascular disease, limb likely not salvageable per Dr Wyn Quaker - prognosis poor for even AKA healing, will likely need extensive endarterectomy/stenting to even salvage an AKA. Can plan for surgery 06/13: anticipate surgery tomorrow. Cardiac clearance.  06/14: endarterectomy/stents bilateral femoral  06/15: recovering in ICU on heparin infusion. Sinus tach w/ elevated temp, (+)SIRS, (+)UA concern for sepsis d/t UTI vs SIRS w/ other medical issues and also eval for PE was neg.  06/16: 1 unit PRBC, CT LLE (+)hematoma/pseudoaneurysm, to OR w/ Dr Lenell Antu for evacuation hematoma and repair venous branch bleed.  06/17: some bleeding around drain site overnight requiring few dressing changes, 1 unit PRBC ordered, hold heparin will monitor.  06/18: stable. Heparin restarted.  06/19: Heparin --> Eliquis. Wound vacs placed to groin  incisions.   Consultants:  Neurology Vascular surgery  Cardiology   Procedures: 11/02/22 Bilateral LE angiogram w/ Dr Wyn Quaker  11/04/22 endarterectomy/stents bilateral femoral w/ Dr Wyn Quaker 11/06/22  evacuation of left groin hematoma and direct repair of venous branch bleeding w/ Dr Lenell Antu       ASSESSMENT & PLAN:   Principal Problem:   Fall Active Problems:   Fall at home, initial encounter   Tobacco abuse   PAD (peripheral artery disease) (HCC)   Essential hypertension   Alcohol abuse   RUE weakness   Non-traumatic rhabdomyolysis   Positive D dimer   Abnormal EKG   LLE w/ severe PAD, ischemic L foot w/ neurological deficits likely cause for fall S/p angiograpy 06/12 and endarterectomy/stenting 06/14 Left leg still neurologically impaired from ischemia. Unlikely to recover  Vascular surgery following Per Dr Wyn Quaker, LLE may require amputation even after revascularization due to neurologic deficits. Appreciate recs re: clearance for discharge to follow outpatient vs anticipate AKA here.  JP drain in place full this morning, wound vacs replaced today  Ok to d/c heparin today, convert to Eliquis and ASA  SIRS - improving  (+)SIRS, w/ tachycardia, tachypnea, elevated WBC CXR --> no apparent pneumonia  UA --> UTI, see below  BCx, UCx --> UCx no growth, completed 3 days Rocephin   ABLA Postoperative hematoma - evacuated 06/16 Monitor CBC/HH closely Transfusion tpday (this makes 2 units PRBC total thus far) Holding heparin this morning  Remain in stepdown for now, will leave there for today if heparin restarted, if no bleeding may consider transfer to progressive   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 HTN HLD Previous Lexiscan w/o ischemia Per cardiology note 02/2021 "high risk, if he develops chest pain with exertion or dyspnea, will schedule left heart cath." Continuing on heparin infusion per vascular team  We were holding statin given transaminitis but risk of  holding may be greater than risk to liver, continue statin and will monitor LFT's closely  DVT L femoral and popliteal V  PE ruled out  Heparin gtt Vascular surgery following but no intervention needed at this time for the DVT    Rhabdomyolysis w/ normal renal function  IV fluids Follow renal function and CK   History CVA w/ residual R sided deficits (+)subacute R cerebellar punctate infarct on MRI ASA We were holding statin given transaminitis but risk of holding may be greater than risk to liver, will monitor LFT's closely Neurology following  On heparin gtt for DVT and ischemic leg  Transaminitis - improving  Hepatic Steatosis on RUQ [place] EtOH use but no acute intoxication based on blood levels - question alcoholic hepatitis  Follow CMP We were holding statin given transaminitis but risk of holding may be greater than risk to liver, will monitor LFT's closely  Weakness and fall Confusion / AMS question toxic metabolic encephalopathy or CVA Likely multifactorial Pressing concern for ischemic LLE as above w/ neuro deficits predisposing to fall  Concern for CVA see MRI brain Potential EtOH withdrawal  Treat multiple underlying issues  PT/OT as able Fall precaution   EtOH use but no acute intoxication based on blood levels  CIWA protocol  Thiamine  Hypokalemia Replace as needed Monitor BMP  Essential HTN lower BP to goal - window for permissive HTN w/ CVA has lapsed  Tobacco abuse Nicotine patch     DVT Tx: has been on heparin, onto Eliquis 11/09/22  Pertinent IV fluids/nutrition: no continuous IV fluids  Central lines / invasive devices: wound vacs  Code Status: FULL CODE ACP documentation reviewed: 11/02/22 none on file   Current Admission Status: inpatient  TOC needs / Dispo plan: SNF Barriers to discharge / significant pending items: vascular clearance prior to discharge - potentially will need AKA vs ok to follow outpatient               Subjective / Brief ROS:  Patient examined in ICU/stepdown Alert Denies chest pain, SOB or palpitations Alert, no concerns  (+)pain LLE about the same, cannot move the foot    Family Communication: none at this time    Objective Findings:  Vitals:   11/09/22 0403 11/09/22 0745 11/09/22 1126 11/09/22 1128  BP: 136/74 (!) 147/68 (!) 119/105 (!) 143/77  Pulse: 88 95 96 95  Resp: 18 18 18    Temp: 98 F (36.7 C) 98.5 F (36.9 C) 98.7 F (37.1 C)   TempSrc: Oral     SpO2: 97% 100% 100% 100%  Weight:      Height:        Intake/Output Summary (Last 24 hours) at 11/09/2022 1600 Last data filed at 11/09/2022 1200 Gross per 24 hour  Intake 2227.37 ml  Output 2660 ml  Net -432.63 ml    Filed Weights   11/04/22 0328 11/07/22 0500 11/08/22 0500  Weight: 71.2 kg 76.3 kg 76.1 kg    Examination:  Physical Exam Constitutional:      General: He is not in acute distress. Cardiovascular:     Rate and Rhythm: Normal rate and regular rhythm.  Pulmonary:     Effort: Pulmonary effort is  normal. No respiratory distress.     Breath sounds: No rhonchi.  Abdominal:     General: There is no distension.     Palpations: Abdomen is soft.  Musculoskeletal:     Right lower leg: No edema.     Left lower leg: No edema.  Skin:    General: Skin is warm and dry.     Comments: LLE warm, RN reports (+)doppler pulses   Neurological:     Mental Status: He is alert.  Psychiatric:        Behavior: Behavior normal.   L groin dressing appears clean, serosanguinous fluid in drain        Scheduled Medications:   aspirin EC  81 mg Oral Daily   atorvastatin  80 mg Oral QPM   Chlorhexidine Gluconate Cloth  6 each Topical Daily   metoprolol succinate  50 mg Oral Daily   nicotine  21 mg Transdermal Daily   mouth rinse  15 mL Mouth Rinse 4 times per day   pantoprazole  40 mg Oral BID   sodium chloride flush  3 mL Intravenous Q12H   thiamine  100 mg Oral Daily     Continuous Infusions:    PRN Medications:  acetaminophen **OR** acetaminophen, ondansetron (ZOFRAN) IV, mouth rinse, mouth rinse, mouth rinse  Antimicrobials from admission:  Anti-infectives (From admission, onward)    Start     Dose/Rate Route Frequency Ordered Stop   11/05/22 1645  cefTRIAXone (ROCEPHIN) 1 g in sodium chloride 0.9 % 100 mL IVPB        1 g 200 mL/hr over 30 Minutes Intravenous Every 24 hours 11/05/22 1555 11/07/22 1653   11/04/22 0634  ceFAZolin (ANCEF) IVPB 2g/100 mL premix        2 g 200 mL/hr over 30 Minutes Intravenous 30 min pre-op 11/04/22 0634 11/04/22 1251   11/02/22 1431  ceFAZolin (ANCEF) IVPB 2g/100 mL premix        2 g 200 mL/hr over 30 Minutes Intravenous 30 min pre-op 11/02/22 1431 11/02/22 1653           Data Reviewed:  I have personally reviewed the following...  CBC: Recent Labs  Lab 11/04/22 0643 11/05/22 0119 11/06/22 0511 11/06/22 1317 11/07/22 0548 11/07/22 1702 11/09/22 0223  WBC 12.2* 14.4* 13.8*  --  16.6*  --  13.5*  HGB 12.7* 10.3* 8.2* 8.6* 7.7* 9.2* 9.1*  HCT 38.6* 31.5* 25.5* 26.1* 23.2* 26.8* 26.9*  MCV 88.1 89.5 91.1  --  87.2  --  85.9  PLT 161 163 177  --  178  --  246    Basic Metabolic Panel: Recent Labs  Lab 11/04/22 0643 11/05/22 0119 11/06/22 0511 11/07/22 0548 11/09/22 0223  NA 138 138 138 139 137  K 3.0* 3.5 3.3* 3.2* 2.8*  CL 103 106 105 107 103  CO2 25 22 23 23 24   GLUCOSE 123* 130* 104* 130* 102*  BUN 9 11 12 14 13   CREATININE 0.87 0.95 1.02 0.95 1.00  CALCIUM 8.5* 7.8* 7.8* 7.5* 7.3*    GFR: Estimated Creatinine Clearance: 67 mL/min (by C-G formula based on SCr of 1 mg/dL). Liver Function Tests: Recent Labs  Lab 11/03/22 0545 11/04/22 0643 11/05/22 0119 11/09/22 0223  AST 408* 426* 243* 161*  ALT 75* 80* 58* 134*  ALKPHOS 49 49 38 53  BILITOT 0.9 0.7 1.3* 0.9  PROT 6.3* 6.7 6.2* 5.6*  ALBUMIN 3.3* 3.5 3.6 2.6*    No results for input(s): "LIPASE", "  AMYLASE" in the  last 168 hours.  No results for input(s): "AMMONIA" in the last 168 hours. Coagulation Profile: Recent Labs  Lab 11/04/22 0643 11/08/22 1306  INR 1.1 1.1    Cardiac Enzymes: Recent Labs  Lab 11/03/22 0545 11/06/22 0500 11/08/22 0421  CKTOTAL 13,747* 4,484* 1,797*    BNP (last 3 results) No results for input(s): "PROBNP" in the last 8760 hours. HbA1C: No results for input(s): "HGBA1C" in the last 72 hours.  CBG: Recent Labs  Lab 11/04/22 1743  GLUCAP 142*    Lipid Profile: No results for input(s): "CHOL", "HDL", "LDLCALC", "TRIG", "CHOLHDL", "LDLDIRECT" in the last 72 hours.  Thyroid Function Tests: No results for input(s): "TSH", "T4TOTAL", "FREET4", "T3FREE", "THYROIDAB" in the last 72 hours. Anemia Panel: No results for input(s): "VITAMINB12", "FOLATE", "FERRITIN", "TIBC", "IRON", "RETICCTPCT" in the last 72 hours. Most Recent Urinalysis On File:     Component Value Date/Time   COLORURINE YELLOW (A) 11/05/2022 1450   APPEARANCEUR CLOUDY (A) 11/05/2022 1450   LABSPEC 1.029 11/05/2022 1450   PHURINE 5.0 11/05/2022 1450   GLUCOSEU NEGATIVE 11/05/2022 1450   HGBUR LARGE (A) 11/05/2022 1450   BILIRUBINUR NEGATIVE 11/05/2022 1450   KETONESUR 5 (A) 11/05/2022 1450   PROTEINUR 100 (A) 11/05/2022 1450   NITRITE NEGATIVE 11/05/2022 1450   LEUKOCYTESUR MODERATE (A) 11/05/2022 1450   Sepsis Labs: @LABRCNTIP (procalcitonin:4,lacticidven:4) Microbiology: Recent Results (from the past 240 hour(s))  Urine Culture (for pregnant, neutropenic or urologic patients or patients with an indwelling urinary catheter)     Status: None   Collection Time: 11/05/22  2:50 PM   Specimen: Urine, Clean Catch  Result Value Ref Range Status   Specimen Description   Final    URINE, CLEAN CATCH Performed at [facility], [address] [place], [place] [address]    Special Requests   Final    NONE Performed at [facility], [address], [place], [place]  [address]    Culture   Final    NO GROWTH Performed at [facility], [address], [place], [place] [address]    Report Status 11/07/2022 FINAL  Final  Culture, blood (Routine X 2) w Reflex to ID Panel     Status: None (Preliminary result)   Collection Time: 11/05/22  3:26 PM   Specimen: BLOOD  Result Value Ref Range Status   Specimen Description   Final    BLOOD BLOOD RIGHT HAND Performed at [facility], [address] [place], [place] [address]    Special Requests   Final    BOTTLES DRAWN AEROBIC AND ANAEROBIC Blood Culture adequate volume Performed at [facility], [address] [place], [place] [address]    Culture   Final    NO GROWTH 4 DAYS Performed at [facility], [address] [place], [place] [address]    Report Status PENDING  Incomplete  Culture, blood (Routine X 2) w Reflex to ID Panel     Status: None (Preliminary result)   Collection Time: 11/05/22  3:29 PM   Specimen: BLOOD  Result Value Ref Range Status   Specimen Description   Final    BLOOD BLOOD LEFT HAND Performed at [facility], [address] [place], [place] [address]    Special Requests   Final    BOTTLES DRAWN AEROBIC AND ANAEROBIC Blood Culture adequate volume Performed at [facility], [address] [place], [place] [address]    Culture  Final    NO GROWTH 4 DAYS Performed at [facility], [address] [place], [place] [address]    Report Status PENDING  Incomplete      Radiology Studies last 3 days: CT ANGIO AO+BIFEM W & OR WO CONTRAST  Addendum Date: 11/06/2022   ADDENDUM REPORT: 11/06/2022 11:10 ADDENDUM: Laterality error in impression #1,  which should read: 1. 6.8 cm hematoma in the deep subcutaneous tissues overlying the LEFT SFA, with possible pseudoaneurysm. Electronically Signed   By: Corlis Leak M.D.   On: 11/06/2022 11:10   Result Date: 11/06/2022 CLINICAL DATA:  Soft tissue mass deep thigh EXAM: CT ANGIOGRAPHY  OF ABDOMINAL AORTA WITH ILIOFEMORAL RUNOFF TECHNIQUE: Multidetector CT imaging of the abdomen, pelvis and lower extremities was performed using the standard protocol during bolus administration of intravenous contrast. Multiplanar CT image reconstructions and MIPs were obtained to evaluate the vascular anatomy. RADIATION DOSE REDUCTION: This exam was performed according to the departmental dose-optimization program which includes automated exposure control, adjustment of the mA and/or kV according to patient size and/or use of iterative reconstruction technique. CONTRAST:  OMNIPAQUE IOHEXOL 350 MG/ML SOLN COMPARISON:  11/02/2022 FINDINGS: VASCULAR Aorta: Moderate calcified atheromatous plaque. No aneurysm, dissection, or significant stenosis. Eccentric intraluminal thrombus in the infrarenal segment without high-grade stenosis as before, an a embolic risk. Celiac: Common origin with SMA. There is heavy calcified plaque with mild ostial stenosis of doubtful hemodynamic significance. SMA: Common origin with the celiac axis. Mild origin stenosis, and scattered calcified atheromatous plaque distally resulting in tandem areas of stenosis. Renals: Single right, with heavily calcified ostial plaque over length of approximately 1.2 cm resulting in at least mild stenosis, patent distally. Single left, with heavily calcified ostial plaque over length of 1.6 cm resulting in stenosis of at least moderate severity, atheromatous but patent distally. IMA: Patent, diminutive RIGHT Lower Extremity Inflow: Common iliac heavily calcified plaque. Patent stent in across its bifurcation. Internal iliac origin occlusion External iliac heavily atheromatous but patent with overlapping stents through its length. Outflow: Common femoral patent. Deep femoral branches patent. SFA long segment occlusion from just beyond its origin through its length. Popliteal is atheromatous, with a small patent channel reconstituted by collaterals.  Runoff: Heavily calcified proximally, limiting luminal evaluation. There is enhancement of the distal anterior tibial artery crossing the ankle as dorsalis pedis. The peroneal artery is opacified just above the calf. LEFT Lower Extremity Inflow: Common iliac heavily atheromatous but now patent, stented through its length. Internal iliac long segment origin occlusion. External iliac atheromatous, stented and now patent through its length Outflow: Common femoral patent. Deep femoral branches patent. SFA long segment occlusion through its length. Popliteal long segment occlusion from its origin. There is collateral reconstitution of a small patent channel below the knee. Runoff: Scattered calcified atheromatous plaque. Anterior and posterior tibial arteries occlude proximal calf. Peroneal artery is diminutive but appears patent to just above the ankle. Veins: No obvious venous abnormality within the limitations of this arterial phase study. Review of the MIP images confirms the above findings. NON-VASCULAR Lower chest: No pleural or pericardial effusion. Coronary calcifications. Hepatobiliary: Multiple small cysts as before. No biliary ductal dilatation. Gallbladder incompletely distended. Pancreas: Unremarkable. No pancreatic ductal dilatation or surrounding inflammatory changes. Spleen: Normal in size without focal abnormality. Adrenals/Urinary Tract: No adrenal mass. Symmetric renal parenchymal enhancement without hydronephrosis or worrisome lesion. Urinary bladder partially decompressed. Foley catheter balloon has been inflated within a small anterior diverticulum, tip isolated from lumen of urinary  bladder. Stomach/Bowel: Stomach is nondistended. Small bowel decompressed. Normal appendix. Colon partially distended by gas and fecal material with scattered diverticula from the descending and proximal sigmoid segments. Mild inflammatory changes posterior to the distal descending colon (Im121,Se6) without abscess.  Lymphatic: No abdominal or pelvic adenopathy. Reproductive: Prostate enlargement with central coarse calcifications. Other: No ascites.  No free air. Musculoskeletal: 5.3 cm hematoma overlying proximal right SFA. 6.8 cm hematoma in the deep subcutaneous tissues overlying proximal right SFA, with possible enhancing neck suggesting pseudoaneurysm. Postop changes and skin staples overlying bilateral common femoral vessels. Left knee effusion. Left thigh and calf soft tissue swelling with subcutaneous edematous change. Regional bones unremarkable. IMPRESSION: 1. 6.8 cm hematoma in the deep subcutaneous tissues overlying proximal right SFA, with possible enhancing neck suggesting pseudoaneurysm. 2. 5.3 cm hematoma overlying proximal right SFA. 3. Restoration of flow through the left common and external iliac arteries. 4. Bilateral long segment SFA and popliteal artery occlusions with collateral reconstitution of a small patent channel below the knee. 5. Foley catheter balloon malpositioned within a small anterior bladder diverticulum, tip isolated from lumen of urinary bladder. Recommend repositioning. 6. Descending and sigmoid diverticulosis with mild inflammatory changes posterior to the distal descending colon, possible mild diverticulitis. 7.  Aortic Atherosclerosis (ICD10-I70.0). Electronically Signed: By: Corlis Leak M.D. On: 11/06/2022 08:20   CT Angio Chest Pulmonary Embolism (PE) W or WO Contrast  Result Date: 11/05/2022 CLINICAL DATA:  High clinical suspicion for PE EXAM: CT ANGIOGRAPHY CHEST WITH CONTRAST TECHNIQUE: Multidetector CT imaging of the chest was performed using the standard protocol during bolus administration of intravenous contrast. Multiplanar CT image reconstructions and MIPs were obtained to evaluate the vascular anatomy. RADIATION DOSE REDUCTION: This exam was performed according to the departmental dose-optimization program which includes automated exposure control, adjustment of the mA  and/or kV according to patient size and/or use of iterative reconstruction technique. CONTRAST:  75mL OMNIPAQUE IOHEXOL 350 MG/ML SOLN COMPARISON:  Noncontrast CT chest done on 08/05/2020 and 11/02/2022 FINDINGS: Cardiovascular: There are no intraluminal filling defects in pulmonary artery branches. There is homogeneous enhancement in thoracic aorta. Atherosclerotic plaques and calcifications are seen in thoracic aorta and its major branches. Coronary artery calcifications are seen. Minimal pericardial effusion is present. Mediastinum/Nodes: No significant lymphadenopathy is seen. Lungs/Pleura: Small patchy densities are seen in posterior right apical region. Similar finding was seen in the previous examination. There are small linear densities in both apices. There is no new focal consolidation. There is no pleural effusion or pneumothorax. Upper Abdomen: There is high density in the dependent portion of gallbladder lumen suggesting possible gallbladder stones. There is no dilation of bile ducts. Numerous scattered small low-density foci in liver with no significant change. Fairly extensive arterial calcifications are seen. There is 2 mm calcific density in the upper pole of left kidney suggesting possible small nonobstructing stone. There is 2.7 cm cyst in the upper pole of right kidney. There are other small subcentimeter low-density foci in the right kidney. Musculoskeletal: No acute findings are seen. Review of the MIP images confirms the above findings. IMPRESSION: There is no evidence of pulmonary artery embolism. There is no evidence of thoracic aortic dissection. Small focus of increased markings in the posterior right apical region has not changed significantly, possibly scarring. There is no focal pulmonary consolidation. There is no pleural effusion. Coronary artery disease. Aortic arteriosclerosis. Possible gallbladder stones. There are numerous small low-density foci in the liver with no significant  interval change, possibly cysts. 2 mm left  renal stone. There are right renal cysts measuring up to 2.7 cm. Electronically Signed   By: Ernie Avena M.D.   On: 11/05/2022 17:18             LOS: 7 days       Sunnie Nielsen, DO [facility] 11/09/2022, 4:00 PM    Dictation software may have been used to generate the above note. Typos may occur and escape review in typed/dictated notes. Please contact Dr Lyn Hollingshead directly for clarity if needed.  Staff may message me via secure chat in Epic  but this may not receive an immediate response,  please page me for urgent matters!  If 7PM-7AM, please contact night coverage [email or web address]

## 2022-11-09 NOTE — Progress Notes (Signed)
ANTICOAGULATION CONSULT NOTE  Pharmacy Consult for Heparin Infusion Indication: atrial fibrillation and DVT  Patient Measurements: Height: 5\' 7"  (170.2 cm) Weight: 76.1 kg (167 lb 12.3 oz) IBW/kg (Calculated) : 66.1 Heparin Dosing Weight: 76.1 kg  Labs: Recent Labs    11/06/22 0500 11/06/22 0511 11/06/22 1317 11/07/22 0548 11/07/22 1702 11/08/22 0421 11/08/22 1306 11/08/22 1922 11/09/22 0223  HGB  --  8.2*   < > 7.7* 9.2*  --   --   --  9.1*  HCT  --  25.5*   < > 23.2* 26.8*  --   --   --  26.9*  PLT  --  177  --  178  --   --   --   --  246  APTT  --   --   --   --   --   --  28  --   --   LABPROT  --   --   --   --   --   --  14.0  --   --   INR  --   --   --   --   --   --  1.1  --   --   HEPARINUNFRC  --  0.31  --   --   --   --   --  0.16* 0.61  CREATININE  --  1.02  --  0.95  --   --   --   --  1.00  CKTOTAL 4,484*  --   --   --   --  1,797*  --   --   --    < > = values in this interval not displayed.    Estimated Creatinine Clearance: 67 mL/min (by C-G formula based on SCr of 1 mg/dL).  Medical History: Past Medical History:  Diagnosis Date   Alcohol use disorder, mild, abuse    COPD (chronic obstructive pulmonary disease) (HCC)    Gout    Hyperlipidemia    Hypertension    Hypothyroid    Peripheral vascular complication    Stroke [facility])     Assessment: Pt is a 67 yo male presenting to ED for fall and c/o left leg pain, found with multiple arterial occlusions in lower extremities.Pt w/ med hx of Eliquis in the past, but stated he has not been taking medication recently. Patient is now POD4 s/p artery endarectomies, and angioplasty with stents on 11/04/22 for PAD. Also with DVT. Patient's hemoglobin dropped post-procedure and heparin infusion was discontinued. Hemoglobin has now stabilized. Pharmacy has been consulted to re-initiate and titrate heparin infusion.  Baseline labs: aPTT 28 sec, INR 1.1, Hgb 9.2, Plts 178  Goal of Therapy:  Heparin level 0.3-0.7  units/ml Monitor platelets by anticoagulation protocol: Yes   Date Time HL 6/18 1922 HL = 0.16, subtherapeutic @ 1300 un/hr 6/19     0223    HL =  0.61, therapeutic X 1    Plan:  --AVOID BOLUS CURRENTLY per vascular team request. Will assess daily when patient is appropriate for bolus dosing again. 6/19:  HL @ 0223 = 0.61, therapeutic X 1 - Will continue pt on current rate and recheck HL in 6 hrs on 6/19 @ 0800.  --CBC daily  Javonnie Illescas D 11/09/2022 2:47 AM

## 2022-11-09 NOTE — TOC Progression Note (Signed)
Transition of Care [facility]) - Progression Note    Patient Details  Name: Corey Olson MRN: [id number] Date of Birth: [birth year]  [facility] CM/SW Contact  Truddie Hidden, RN Phone Number: 11/09/2022, 2:09 PM  Clinical Narrative:    Sherron Monday with Tiffany at [facility] regarding bed offers. She is still reviewing referral for possible offer. Patient has other bed offers for [facility], [facility], [facility], and [address] .         Expected Discharge Plan and Services                                               Social Determinants of Health (SDOH) Interventions SDOH Screenings   Food Insecurity: No Food Insecurity (11/02/2022)  Housing: Low Risk  (11/02/2022)  Transportation Needs: No Transportation Needs (11/02/2022)  Utilities: Not At Risk (11/02/2022)  Tobacco Use: High Risk (11/09/2022)    Readmission Risk Interventions     No data to display

## 2022-11-09 NOTE — Plan of Care (Signed)

## 2022-11-10 ENCOUNTER — Other Ambulatory Visit (HOSPITAL_COMMUNITY): Payer: Self-pay

## 2022-11-10 DIAGNOSIS — I998 Other disorder of circulatory system: Secondary | ICD-10-CM | POA: Diagnosis not present

## 2022-11-10 LAB — BASIC METABOLIC PANEL
Anion gap: 9 (ref 5–15)
BUN: 13 mg/dL (ref 8–23)
CO2: 25 mmol/L (ref 22–32)
Calcium: 8.1 mg/dL — ABNORMAL LOW (ref 8.9–10.3)
Chloride: 101 mmol/L (ref 98–111)
Creatinine, Ser: 1.06 mg/dL (ref 0.61–1.24)
GFR, Estimated: >60 mL/min (ref >60)
Glucose, Bld: 95 mg/dL (ref 70–99)
Potassium: 3.3 mmol/L — ABNORMAL LOW (ref 3.5–5.1)
Sodium: 135 mmol/L (ref 135–145)

## 2022-11-10 LAB — CBC
HCT: 28.3 % — ABNORMAL LOW (ref 39.0–52.0)
Hemoglobin: 9.4 g/dL — ABNORMAL LOW (ref 13.0–17.0)
MCH: 28.8 pg (ref 26.0–34.0)
MCHC: 33.2 g/dL (ref 30.0–36.0)
MCV: 86.8 fL (ref 80.0–100.0)
Platelets: 293 10*3/uL (ref 150–400)
RBC: 3.26 MIL/uL — ABNORMAL LOW (ref 4.22–5.81)
RDW: 14.6 % (ref 11.5–15.5)
WBC: 14.8 10*3/uL — ABNORMAL HIGH (ref 4.0–10.5)
nRBC: 0.1 % (ref 0.0–0.2)

## 2022-11-10 LAB — CULTURE, BLOOD (ROUTINE X 2): Culture: NO GROWTH

## 2022-11-10 MED ORDER — LISINOPRIL 20 MG PO TABS
20.0000 mg | ORAL_TABLET | Freq: Every day | ORAL | Status: DC
  Administered 2022-11-10 – 2022-11-22 (×13): 20 mg via ORAL
  Filled 2022-11-10 (×13): qty 1

## 2022-11-10 MED ORDER — APIXABAN 5 MG PO TABS
5.0000 mg | ORAL_TABLET | Freq: Two times a day (BID) | ORAL | Status: DC
  Administered 2022-11-10 – 2022-11-22 (×24): 5 mg via ORAL
  Filled 2022-11-10 (×24): qty 1

## 2022-11-10 MED ORDER — POTASSIUM CHLORIDE CRYS ER 20 MEQ PO TBCR
40.0000 meq | EXTENDED_RELEASE_TABLET | Freq: Once | ORAL | Status: AC
  Administered 2022-11-10: 40 meq via ORAL
  Filled 2022-11-10: qty 2

## 2022-11-10 NOTE — Care Management Important Message (Signed)
Important Message  Patient Details  Name: Corey Olson MRN: [id number] Date of Birth: 08/11/1955   Medicare Important Message Given:  Yes     [name] 11/10/2022, 2:07 PM

## 2022-11-10 NOTE — Plan of Care (Signed)
Patient is participating in goals of care to meet goals for discharge.  Terrilyn Saver, RN    Problem: Education: Goal: Knowledge of General Education information will improve Description: Including pain rating scale, medication(s)/side effects and non-pharmacologic comfort measures Outcome: Progressing   Problem: Health Behavior/Discharge Planning: Goal: Ability to manage health-related needs will improve Outcome: Progressing   Problem: Clinical Measurements: Goal: Ability to maintain clinical measurements within normal limits will improve Outcome: Progressing Goal: Will remain free from infection Outcome: Progressing Goal: Diagnostic test results will improve Outcome: Progressing Goal: Respiratory complications will improve Outcome: Progressing Goal: Cardiovascular complication will be avoided Outcome: Progressing   Problem: Activity: Goal: Risk for activity intolerance will decrease Outcome: Progressing   Problem: Nutrition: Goal: Adequate nutrition will be maintained Outcome: Progressing   Problem: Coping: Goal: Level of anxiety will decrease Outcome: Progressing   Problem: Elimination: Goal: Will not experience complications related to bowel motility Outcome: Progressing Goal: Will not experience complications related to urinary retention Outcome: Progressing   Problem: Pain Managment: Goal: General experience of comfort will improve Outcome: Progressing   Problem: Safety: Goal: Ability to remain free from injury will improve Outcome: Progressing   Problem: Skin Integrity: Goal: Risk for impaired skin integrity will decrease Outcome: Progressing   Problem: Education: Goal: Knowledge of disease or condition will improve Outcome: Progressing Goal: Knowledge of secondary prevention will improve (MUST DOCUMENT ALL) Outcome: Progressing Goal: Knowledge of patient specific risk factors will improve Loraine Leriche N/A or DELETE if not current risk  factor) Outcome: Progressing   Problem: Ischemic Stroke/TIA Tissue Perfusion: Goal: Complications of ischemic stroke/TIA will be minimized Outcome: Progressing   Problem: Coping: Goal: Will verbalize positive feelings about self Outcome: Progressing Goal: Will identify appropriate support needs Outcome: Progressing   Problem: Health Behavior/Discharge Planning: Goal: Ability to manage health-related needs will improve Outcome: Progressing Goal: Goals will be collaboratively established with patient/family Outcome: Progressing   Problem: Self-Care: Goal: Ability to participate in self-care as condition permits will improve Outcome: Progressing Goal: Verbalization of feelings and concerns over difficulty with self-care will improve Outcome: Progressing Goal: Ability to communicate needs accurately will improve Outcome: Progressing   Problem: Nutrition: Goal: Risk of aspiration will decrease Outcome: Progressing Goal: Dietary intake will improve Outcome: Progressing

## 2022-11-10 NOTE — Plan of Care (Signed)

## 2022-11-10 NOTE — Progress Notes (Signed)
Progress Note    11/10/2022 2:28 PM 4 Days Post-Op  Subjective:  Corey Olson is a 67 y.o. male with a history of stroke, peripheral atrial disease who presents with altered mental status, left leg weakness. Patient reports left leg weakness dragging his left foot while walking and decreased sensation, all of which were new but without clear timelines. 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    Patient is now 6 days postop from bilateral femoral endarterectomies with stent placements.  He is currently resting comfortably in bed now on the floor.  He is noted to have a prevena wound vac on his right groin with some noted drainage.  He is also noted to have a prevena wound vac on his left groin. JP drain has been removed.  Patient has left upper extremity hand mitten on to keep from pulling his IV lines out and his dressings off.  Patient endorses pain to both groins.  No other complaints at this time.  Vitals all stable.   Vitals:   11/10/22 0847 11/10/22 1253  BP: (!) 140/67 (!) 121/57  Pulse: 100 (!) 105  Resp: 18 18  Temp: 98 F (36.7 C) 98 F (36.7 C)  SpO2: 100% 98%   Physical Exam: Cardiac:  RRR with Tachycardia present  Lungs: Normal pulmonary effort and no respiratory distress.  No wheezing rhonchi or rales noted. Incisions: Bilateral groin Prevena wound vacs in place working well Extremities: Bilateral lower extremities have Doppler DP and PT pulses.  No edema to lower extremities noted Abdomen: Positive bowel sounds throughout.  Soft, flat, nontender and nondistended. Neurologic: Patient is alert only.  He does answer my questions this afternoon.  He does follow commands. Left foot is paralyzed due to ischemia upon arrival.  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  CBC    Component Value Date/Time   WBC 14.8 (H) 11/10/2022 0444   RBC 3.26 (L) 11/10/2022 0444   HGB 9.4 (L) 11/10/2022 0444   HCT 28.3 (L) 11/10/2022 0444   PLT 293 11/10/2022 0444   MCV  86.8 11/10/2022 0444   MCH 28.8 11/10/2022 0444   MCHC 33.2 11/10/2022 0444   RDW 14.6 11/10/2022 0444   LYMPHSABS 1.1 11/01/2022 1843   MONOABS 0.6 11/01/2022 1843   EOSABS 0.0 11/01/2022 1843   BASOSABS 0.0 11/01/2022 1843    BMET    Component Value Date/Time   NA 135 11/10/2022 0444   K 3.3 (L) 11/10/2022 0444   CL 101 11/10/2022 0444   CO2 25 11/10/2022 0444   GLUCOSE 95 11/10/2022 0444   BUN 13 11/10/2022 0444   CREATININE 1.06 11/10/2022 0444   CALCIUM 8.1 (L) 11/10/2022 0444   GFRNONAA >60 11/10/2022 0444    INR    Component Value Date/Time   INR 1.1 11/08/2022 1306     Intake/Output Summary (Last 24 hours) at 11/10/2022 1428 Last data filed at 11/10/2022 1111 Gross per 24 hour  Intake 260 ml  Output 4600 ml  Net -4340 ml     Assessment/Plan:  67 y.o. male is s/p  bilateral femoral endarterectomies with stent placement from 5 days ago and reexploration of the left groin for hematoma with Jackson-Pratt placement.  Al Pimple drain has been removed. 4 Days Post-Op   Plan: Continue to monitor Prevena wound vacs. Avoid narcotics and use Tylenol for pain. Continue Eliquis 5 mg twice daily and aspirin 81 mg daily.  DVT prophylaxis: Eliquis 5 mg twice daily   Corey Olson  [facility] 11/10/2022 2:28 PM

## 2022-11-10 NOTE — Progress Notes (Signed)
EPIC Chatted Provider to notify telemetry states had 6 Beats of Vtach and strip has been uploaded into chart.

## 2022-11-10 NOTE — TOC Benefit Eligibility Note (Signed)
Pharmacy Patient Advocate Encounter  Insurance verification completed.    The patient is insured through [facility]    Ran test claim for Eliquis and the current 30 day co-pay is $11.20.   This test claim was processed through [facility]- copay amounts may vary at other pharmacies due to pharmacy/plan contracts, or as the patient moves through the different stages of their insurance plan.

## 2022-11-10 NOTE — Progress Notes (Signed)
PT Cancellation Note  Patient Details Name: Corey Olson MRN: [id number] DOB: [birth year]   Cancelled Treatment:     PT attempt. Mostly untouched lunch tray at bedside. Pt asleep with supportive sister at bedside. " He has only been awake for a few seconds then goes back to sleep." She requested PT attempt earlier in the day tomorrow. Author assisted her with placement of dinner order. She reports she will assist him with eating it once delivered. PT will continue to follow per current POC progressing as able per pt tolerance.    Rushie Chestnut 11/10/2022, 4:46 PM

## 2022-11-10 NOTE — Progress Notes (Signed)
PROGRESS NOTE  Corey Olson ZOX:[id number] DOB: 1955/08/24 DOA: 11/01/2022 PCP: [name], MD  HPI/Recap of past 24 hours: Corey Olson is a 67 y.o. male with a history of stroke, peripheral atrial disease who presents with altered mental status, left leg weakness.  Family spoke to him at 4:30 and felt that he was confused.  Prior to that he had not been spoken to since the night before when he had seemed normal.  Patient reports left leg weakness dragging his left foot while walking and decreased sensation, all of which were new but without clear timelines. 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  06/11: in ED, VSS, code stroke. Admitted to hospitalist service. Elevated AST/ALT (+)hepatic steatosis, rhabdo, (+)DVT L femoral and popliteal V.  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 Found to have weakneed pulses LLE --> ischemia/severe PAD LLE, heparin gtt, RLE and LLE angiogram. Significant vascular disease, limb likely not salvageable per Dr Wyn Quaker - prognosis poor for even AKA healing, will likely need extensive endarterectomy/stenting to even salvage an AKA. Can plan for surgery 06/13: anticipate surgery tomorrow. Cardiac clearance.  06/14: endarterectomy/stents bilateral femoral  06/15: recovering in ICU on heparin infusion. Sinus tach w/ elevated temp, (+)SIRS, (+)UA concern for sepsis d/t UTI vs SIRS w/ other medical issues and also eval for PE was neg.  06/16: 1 unit PRBC, CT LLE (+)hematoma/pseudoaneurysm, to OR w/ Dr Lenell Antu for evacuation hematoma and repair venous branch bleed.  06/17: some bleeding around drain site overnight requiring few dressing changes, 1 unit PRBC ordered, hold heparin will monitor.  06/18: stable. Heparin restarted.  06/19: Heparin --> Eliquis. Wound vacs placed to groin incisions.   11/10/2022: The patient was seen  and examined at bedside.  He is alert and not answering to questions appropriately, very confused.  Assessment/Plan: Principal Problem:   Ischemia of extremity Active Problems:   Fall at home, initial encounter   Tobacco abuse   PAD (peripheral artery disease) (HCC)   Atherosclerosis of native arteries of extremity with intermittent claudication (HCC)   Renal artery stenosis (HCC)   Essential hypertension   Hyperlipidemia   Alcohol abuse   RUE weakness   Non-traumatic rhabdomyolysis   Positive D dimer   Abnormal EKG   Fall   Limb ischemia   Cerebrovascular accident (CVA) (HCC)   Coronary artery disease of native artery of native heart with stable angina pectoris (HCC)  LLE w/ severe PAD, ischemic L foot w/ neurological deficits likely cause for fall S/p angiograpy 06/12 and endarterectomy/stenting 06/14 Left leg still neurologically impaired from ischemia. Unlikely to recover  Vascular surgery following Per Dr Wyn Quaker, LLE may require amputation even after revascularization due to neurologic deficits. Appreciate recs re: clearance for discharge to follow outpatient vs anticipate AKA here.  JP drain in place full this morning, wound vacs replaced 6/19   SIRS - improving  (+)SIRS, w/ tachycardia, tachypnea, elevated WBC WBC is uptrending. Blood cultures x 2 no growth x 5 days Urine culture no growth. He had completed 3 days of Rocephin.   ABLA Postoperative hematoma - evacuated 06/16 Continue to closely monitor H&H Hemoglobin stable at 9.4 from 9.1.   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 HTN HLD Previous Lexiscan w/o ischemia Per cardiology note 02/2021 "high risk, if he develops chest pain with exertion or dyspnea, will  schedule left heart cath."   DVT L femoral and popliteal V  PE ruled out  , Switched to Eliquis on 6/20, continue   Rhabdomyolysis w/ normal renal function  CK peaked at greater than 4400, downtrending to 1700.   History CVA w/ residual R  sided deficits (+)subacute R cerebellar punctate infarct on MRI He is currently on aspirin, Eliquis, and high intensity, statin, Lipitor 80 mg daily.  Transaminitis - Hepatic Steatosis on RUQ [place] EtOH use but no acute intoxication based on blood levels - question alcoholic hepatitis  Follow CMP We were holding statin given transaminitis but risk of holding may be greater than risk to liver, will monitor LFT's closely   Weakness and fall Confusion / AMS question toxic metabolic encephalopathy or CVA Likely multifactorial Pressing concern for ischemic LLE as above w/ neuro deficits predisposing to fall  Concern for CVA see MRI brain Potential EtOH withdrawal  Treat multiple underlying issues  PT/OT as able Fall precaution    EtOH use but no acute intoxication based on blood levels  CIWA protocol  Thiamine   Refractory hypokalemia Continue to replace as needed Monitor BMP   Essential HTN lower BP to goal - window for permissive HTN w/ CVA has lapsed   Tobacco abuse Nicotine patch        DVT Tx: Eliquis 11/09/2022. Pertinent IV fluids/nutrition: no continuous IV fluids  Central lines / invasive devices: wound vacs   Code Status: FULL CODE ACP documentation reviewed: 11/02/22 none on file    Current Admission Status: inpatient  TOC needs / Dispo plan: SNF Barriers to discharge / significant pending items: vascular clearance prior to discharge - potentially will need AKA vs ok to follow outpatient.    Status is: Inpatient The patient requires at least 2 midnights for further evaluation and treatment of present condition.    Objective: Vitals:   11/09/22 1949 11/09/22 2308 11/10/22 0847 11/10/22 1253  BP: 125/77 (!) 145/72 (!) 140/67 (!) 121/57  Pulse: 62 95 100 (!) 105  Resp: 16 18 18 18   Temp: 97.7 F (36.5 C) 98.1 F (36.7 C) 98 F (36.7 C) 98 F (36.7 C)  TempSrc:  Oral Oral   SpO2: 96% 99% 100% 98%  Weight:      Height:        Intake/Output Summary  (Last 24 hours) at 11/10/2022 1432 Last data filed at 11/10/2022 1111 Gross per 24 hour  Intake 260 ml  Output 4600 ml  Net -4340 ml   Filed Weights   11/04/22 0328 11/07/22 0500 11/08/22 0500  Weight: 71.2 kg 76.3 kg 76.1 kg    Exam:  General: 67 y.o. year-old male frail-appearing in no acute distress.  Alert and confused. Cardiovascular: Regular rate and rhythm with no rubs or gallops.  No thyromegaly or JVD noted.   Respiratory: Clear to auscultation with no wheezes or rales. Good inspiratory effort. Abdomen: Soft nontender nondistended with normal bowel sounds x4 quadrants. Musculoskeletal: No lower extremity edema. 2/4 pulses in all 4 extremities. Skin: No ulcerative lesions noted or rashes, Psychiatry: Mood is appropriate for condition and setting   Data Reviewed: CBC: Recent Labs  Lab 11/05/22 0119 11/06/22 0511 11/06/22 1317 11/07/22 0548 11/07/22 1702 11/09/22 0223 11/10/22 0444  WBC 14.4* 13.8*  --  16.6*  --  13.5* 14.8*  HGB 10.3* 8.2* 8.6* 7.7* 9.2* 9.1* 9.4*  HCT 31.5* 25.5* 26.1* 23.2* 26.8* 26.9* 28.3*  MCV 89.5 91.1  --  87.2  --  85.9  86.8  PLT 163 177  --  178  --  246 293   Basic Metabolic Panel: Recent Labs  Lab 11/05/22 0119 11/06/22 0511 11/07/22 0548 11/09/22 0223 11/10/22 0444  NA 138 138 139 137 135  K 3.5 3.3* 3.2* 2.8* 3.3*  CL 106 105 107 103 101  CO2 22 23 23 24 25   GLUCOSE 130* 104* 130* 102* 95  BUN 11 12 14 13 13   CREATININE 0.95 1.02 0.95 1.00 1.06  CALCIUM 7.8* 7.8* 7.5* 7.3* 8.1*   GFR: Estimated Creatinine Clearance: 63.2 mL/min (by C-G formula based on SCr of 1.06 mg/dL). Liver Function Tests: Recent Labs  Lab 11/04/22 0643 11/05/22 0119 11/09/22 0223  AST 426* 243* 161*  ALT 80* 58* 134*  ALKPHOS 49 38 53  BILITOT 0.7 1.3* 0.9  PROT 6.7 6.2* 5.6*  ALBUMIN 3.5 3.6 2.6*   No results for input(s): "LIPASE", "AMYLASE" in the last 168 hours. No results for input(s): "AMMONIA" in the last 168 hours. Coagulation  Profile: Recent Labs  Lab 11/04/22 0643 11/08/22 1306  INR 1.1 1.1   Cardiac Enzymes: Recent Labs  Lab 11/06/22 0500 11/08/22 0421  CKTOTAL 4,484* 1,797*   BNP (last 3 results) No results for input(s): "PROBNP" in the last 8760 hours. HbA1C: No results for input(s): "HGBA1C" in the last 72 hours. CBG: Recent Labs  Lab 11/04/22 1743  GLUCAP 142*   Lipid Profile: No results for input(s): "CHOL", "HDL", "LDLCALC", "TRIG", "CHOLHDL", "LDLDIRECT" in the last 72 hours. Thyroid Function Tests: No results for input(s): "TSH", "T4TOTAL", "FREET4", "T3FREE", "THYROIDAB" in the last 72 hours. Anemia Panel: No results for input(s): "VITAMINB12", "FOLATE", "FERRITIN", "TIBC", "IRON", "RETICCTPCT" in the last 72 hours. Urine analysis:    Component Value Date/Time   COLORURINE YELLOW (A) 11/05/2022 1450   APPEARANCEUR CLOUDY (A) 11/05/2022 1450   LABSPEC 1.029 11/05/2022 1450   PHURINE 5.0 11/05/2022 1450   GLUCOSEU NEGATIVE 11/05/2022 1450   HGBUR LARGE (A) 11/05/2022 1450   BILIRUBINUR NEGATIVE 11/05/2022 1450   KETONESUR 5 (A) 11/05/2022 1450   PROTEINUR 100 (A) 11/05/2022 1450   NITRITE NEGATIVE 11/05/2022 1450   LEUKOCYTESUR MODERATE (A) 11/05/2022 1450   Sepsis Labs: @LABRCNTIP (procalcitonin:4,lacticidven:4)  ) Recent Results (from the past 240 hour(s))  Urine Culture (for pregnant, neutropenic or urologic patients or patients with an indwelling urinary catheter)     Status: None   Collection Time: 11/05/22  2:50 PM   Specimen: Urine, Clean Catch  Result Value Ref Range Status   Specimen Description   Final    URINE, CLEAN CATCH Performed at [facility], [address], [place], [place] [address]    Special Requests   Final    NONE Performed at [facility], [address], [place], [place] [address]    Culture   Final    NO GROWTH Performed at [facility], [address], [place], [place] [address]    Report Status 11/07/2022 FINAL   Final  Culture, blood (Routine X 2) w Reflex to ID Panel     Status: None   Collection Time: 11/05/22  3:26 PM   Specimen: BLOOD  Result Value Ref Range Status   Specimen Description BLOOD BLOOD RIGHT HAND  Final   Special Requests   Final    BOTTLES DRAWN AEROBIC AND ANAEROBIC Blood Culture adequate volume   Culture   Final    NO GROWTH 5 DAYS Performed at [facility], [address] [place],  [place] [address]    Report Status 11/10/2022 FINAL  Final  Culture, blood (Routine X 2) w Reflex to ID Panel     Status: None   Collection Time: 11/05/22  3:29 PM   Specimen: BLOOD  Result Value Ref Range Status   Specimen Description BLOOD BLOOD LEFT HAND  Final   Special Requests   Final    BOTTLES DRAWN AEROBIC AND ANAEROBIC Blood Culture adequate volume   Culture   Final    NO GROWTH 5 DAYS Performed at [facility], [address], [place], [place] [address]    Report Status 11/10/2022 FINAL  Final      Studies: No results found.  Scheduled Meds:  apixaban  5 mg Oral BID   aspirin EC  81 mg Oral Daily   atorvastatin  80 mg Oral QPM   Chlorhexidine Gluconate Cloth  6 each Topical Daily   lisinopril  20 mg Oral Daily   metoprolol succinate  50 mg Oral Daily   nicotine  21 mg Transdermal Daily   mouth rinse  15 mL Mouth Rinse 4 times per day   pantoprazole  40 mg Oral BID   sodium chloride flush  3 mL Intravenous Q12H   thiamine  100 mg Oral Daily    Continuous Infusions:   LOS: 8 days     Darlin Drop, MD [facility] Pager [phone]  If 7PM-7AM, please contact night-coverage [email or web address] Password [facility] 11/10/2022, 2:32 PM

## 2022-11-10 NOTE — Progress Notes (Signed)
   11/10/22 1500  Spiritual Encounters  Type of Visit Initial  Care provided to: Patient  Referral source Chaplain assessment  Reason for visit Routine spiritual support  OnCall Visit No  Spiritual Framework  Presenting Themes Meaning/purpose/sources of inspiration  Community/Connection Family  Patient Stress Factors None identified  Family Stress Factors None identified  Interventions  Spiritual Care Interventions Made Compassionate presence;Reflective listening   Chaplain on rounds went to visit pt. Chaplain asked the pt how he was doing and he said he was doing okay. Pt seemed to be a bit confused about how long he has been in the hospital and what he is here for. Chaplain informed the pt that chaplain services is available for support as needed.

## 2022-11-11 DIAGNOSIS — I25118 Atherosclerotic heart disease of native coronary artery with other forms of angina pectoris: Secondary | ICD-10-CM | POA: Diagnosis not present

## 2022-11-11 DIAGNOSIS — I70213 Atherosclerosis of native arteries of extremities with intermittent claudication, bilateral legs: Secondary | ICD-10-CM

## 2022-11-11 DIAGNOSIS — Z515 Encounter for palliative care: Secondary | ICD-10-CM

## 2022-11-11 DIAGNOSIS — Z7189 Other specified counseling: Secondary | ICD-10-CM

## 2022-11-11 DIAGNOSIS — I998 Other disorder of circulatory system: Secondary | ICD-10-CM | POA: Diagnosis not present

## 2022-11-11 DIAGNOSIS — I639 Cerebral infarction, unspecified: Secondary | ICD-10-CM | POA: Diagnosis not present

## 2022-11-11 LAB — MAGNESIUM: Magnesium: 1.9 mg/dL (ref 1.7–2.4)

## 2022-11-11 LAB — CBC
HCT: 30.8 % — ABNORMAL LOW (ref 39.0–52.0)
Hemoglobin: 10.2 g/dL — ABNORMAL LOW (ref 13.0–17.0)
MCH: 29.1 pg (ref 26.0–34.0)
MCHC: 33.1 g/dL (ref 30.0–36.0)
MCV: 88 fL (ref 80.0–100.0)
Platelets: 347 10*3/uL (ref 150–400)
RBC: 3.5 MIL/uL — ABNORMAL LOW (ref 4.22–5.81)
RDW: 14.7 % (ref 11.5–15.5)
WBC: 15.1 10*3/uL — ABNORMAL HIGH (ref 4.0–10.5)
nRBC: 0 % (ref 0.0–0.2)

## 2022-11-11 LAB — BASIC METABOLIC PANEL
Anion gap: 11 (ref 5–15)
BUN: 14 mg/dL (ref 8–23)
CO2: 24 mmol/L (ref 22–32)
Calcium: 7.9 mg/dL — ABNORMAL LOW (ref 8.9–10.3)
Chloride: 100 mmol/L (ref 98–111)
Creatinine, Ser: 1.04 mg/dL (ref 0.61–1.24)
GFR, Estimated: >60 mL/min (ref >60)
Glucose, Bld: 109 mg/dL — ABNORMAL HIGH (ref 70–99)
Potassium: 3.3 mmol/L — ABNORMAL LOW (ref 3.5–5.1)
Sodium: 135 mmol/L (ref 135–145)

## 2022-11-11 LAB — PHOSPHORUS: Phosphorus: 3.6 mg/dL (ref 2.5–4.6)

## 2022-11-11 MED ORDER — ALLOPURINOL 100 MG PO TABS
300.0000 mg | ORAL_TABLET | Freq: Every day | ORAL | Status: DC
  Administered 2022-11-12 – 2022-11-22 (×11): 300 mg via ORAL
  Filled 2022-11-11 (×11): qty 3

## 2022-11-11 NOTE — Progress Notes (Signed)
Occupational Therapy Treatment Patient Details Name: Corey Olson MRN: [id number] DOB: [birth year] Today's Date: 11/11/2022   History of present illness Pt is a 67 yo male s/p bilateral femoral endarterectomy and iliac stenting. 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 PMH of COPD, gout, HLD, HTN, PVD, CVA, etoh.   OT comments  Chart reviewed, pt greeted in bed, oriented x4. Pt with improved alertness and following one step directions with increased time as compared to PT session this morning per chart review. RN in room prior to mobility to assess wound vac seal. Tx session targeted improving bed mobility, ADL tasks in order to improve functional performance. Pt performs feeding with MIN A, progressing to set up with L hand. Bed mobility completed with MOD-MAX A +2. Pt seated on edge of bed for approx 5 minutes with good static sitting balance. Pt is left in bed, all needs met. RN aware of pt status. OT will follow acutely.    Recommendations for follow up therapy are one component of a multi-disciplinary discharge planning process, led by the attending physician.  Recommendations may be updated based on patient status, additional functional criteria and insurance authorization.    Assistance Recommended at Discharge Frequent or constant Supervision/Assistance  Patient can return home with the following  Two people to help with walking and/or transfers;Two people to help with bathing/dressing/bathroom;Assistance with cooking/housework;Direct supervision/assist for medications management;Direct supervision/assist for financial management;Assist for transportation;Help with stairs or ramp for entrance   Equipment Recommendations  Other (comment) (defer)    Recommendations for Other Services      Precautions / Restrictions Precautions Precautions: Fall Precaution  Comments: old R hemi, weakness globally       Mobility Bed Mobility Overal bed mobility: Needs Assistance Bed Mobility: Supine to Sit, Sidelying to Sit, Sit to Sidelying, Sit to Supine   Sidelying to sit: Mod assist Supine to sit: Max assist Sit to supine: Max assist Sit to sidelying: Mod assist General bed mobility comments: +2 throughout    Transfers Overall transfer level: Needs assistance Equipment used: 1 person hand held assist               General transfer comment: STS attempted requiring MAX A with pt unable to clear buttocks     Balance Overall balance assessment: Needs assistance Sitting-balance support: Feet supported Sitting balance-Leahy Scale: Fair     Standing balance support: Single extremity supported, During functional activity Standing balance-Leahy Scale: Zero                             ADL either performed or assessed with clinical judgement   ADL Overall ADL's : Needs assistance/impaired Eating/Feeding: Minimal assistance;Set up;Bed level   Grooming: Wash/dry face;Sitting;Minimal assistance       Lower Body Bathing: Maximal assistance   Upper Body Dressing : Moderate assistance   Lower Body Dressing: Maximal assistance Lower Body Dressing Details (indicate cue type and reason): prevlon boots     Toileting- Clothing Manipulation and Hygiene: Maximal assistance;+2 for safety/equipment;Bed level Toileting - Clothing Manipulation Details (indicate cue type and reason): rolling            Extremity/Trunk Assessment              Vision       Perception     Praxis  Cognition Arousal/Alertness: Awake/alert Behavior During Therapy: Flat affect Overall Cognitive Status: No family/caregiver present to determine baseline cognitive functioning Area of Impairment: Orientation, Attention, Memory, Following commands, Safety/judgement, Awareness                 Orientation Level: Disoriented to, Place,  Time, Situation Current Attention Level: Sustained Memory: Decreased short-term memory Following Commands: Follows one step commands with increased time Safety/Judgement: Decreased awareness of safety, Decreased awareness of deficits Awareness: Intellectual            Exercises      Shoulder Instructions       General Comments Prevlon boots donned with nurse present, RN also present to assess wound vacs. All lines/leads/vacs intact post session.    Pertinent Vitals/ Pain       Pain Assessment Pain Assessment: PAINAD Breathing: normal Negative Vocalization: none Facial Expression: sad, frightened, frown Body Language: relaxed Consolability: no need to console PAINAD Score: 1  Home Living                                          Prior Functioning/Environment              Frequency  Min 2X/week        Progress Toward Goals  OT Goals(current goals can now be found in the care plan section)  Progress towards OT goals: Progressing toward goals     Plan Discharge plan remains appropriate;Frequency needs to be updated    Co-evaluation                 AM-PAC OT "6 Clicks" Daily Activity     Outcome Measure   Help from another person eating meals?: A Lot Help from another person taking care of personal grooming?: A Lot Help from another person toileting, which includes using toliet, bedpan, or urinal?: A Lot Help from another person bathing (including washing, rinsing, drying)?: A Lot Help from another person to put on and taking off regular upper body clothing?: A Lot Help from another person to put on and taking off regular lower body clothing?: A Lot 6 Click Score: 12    End of Session    OT Visit Diagnosis: Other abnormalities of gait and mobility (R26.89);Muscle weakness (generalized) (M62.81)   Activity Tolerance Patient tolerated treatment well   Patient Left in bed;with call bell/phone within reach;with bed alarm  set;with nursing/sitter in room   Nurse Communication Mobility status        Time: 4098-1191 OT Time Calculation (min): 27 min  Charges: OT General Charges $OT Visit: 1 Visit OT Treatments $Self Care/Home Management : 23-37 mins  Oleta Mouse, OTD OTR/L  11/11/22, 4:25 PM

## 2022-11-11 NOTE — Progress Notes (Signed)
Physical Therapy Treatment Patient Details Name: Corey Olson MRN: [id number] DOB: September 10, 1955 Today's Date: 11/11/2022   History of Present Illness Pt is a 67 yo male s/p bilateral femoral endarterectomy and iliac stenting. 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 PMH of COPD, gout, HLD, HTN, PVD, CVA, etoh.    PT Comments    Pt was awake in long sitting. He is disoriented and confused overall. Attempted to reorient however pt remained disoriented. Inconsistently follows commands but was cooperative. He required extensive assistance to achieve EOB sitting. Performed exercises while EOB but pt unwilling to attempt standing." Let me lay down." Bilateral groin wound vacs with poor seal. MD/RN staff aware. Pt is severely limited overall. Requested equipment and palliative consult. Acute PT will continue efforts to maximize pt's abilities and safety with all ADLs.    Recommendations for follow up therapy are one component of a multi-disciplinary discharge planning process, led by the attending physician.  Recommendations may be updated based on patient status, additional functional criteria and insurance authorization.     Assistance Recommended at Discharge Frequent or constant Supervision/Assistance  Patient can return home with the following Two people to help with walking and/or transfers;Two people to help with bathing/dressing/bathroom;Assistance with cooking/housework;Assistance with feeding;Direct supervision/assist for medications management;Direct supervision/assist for financial management;Assist for transportation;Help with stairs or ramp for entrance   Equipment Recommendations  Other (comment) (ongoing assessment)       Precautions / Restrictions Precautions Precautions: Fall Precaution Comments: old R hemi, weakness globally     Mobility  Bed  Mobility Overal bed mobility: Needs Assistance Bed Mobility: Supine to Sit, Sidelying to Sit, Sit to Sidelying, Sit to Supine Sidelying to sit: Mod assist Supine to sit: Max assist Sit to supine: Max assist Sit to sidelying: Mod assist General bed mobility comments: pt rolled L to short sit however recvommend rolling R to short sit in future sessions. R side deficits more pronounced than L side    Transfers  General transfer comment: Pt unwilling to attempt standing but did perform some active exercises while sitting EOB. Mostly AAROM with RUE/RLE. not flaccid but very weak.       Balance Overall balance assessment: Needs assistance Sitting-balance support: Feet supported Sitting balance-Leahy Scale: Fair Sitting balance - Comments: CGA mostly while sitting EOB  Standing balance comment: pt unwilling to attempt standing      Cognition Arousal/Alertness: Awake/alert Behavior During Therapy: Flat affect Overall Cognitive Status: No family/caregiver present to determine baseline cognitive functioning    General Comments: Pt is alert but disorieneted and confused. Completely unaware of situation, location, or current limitations.recommend +2 assist for all future sessions however only +1 this session.           General Comments General comments (skin integrity, edema, etc.): pt perform ROM/ strengthening exercises while seated EOB. Both wound vacs observed to not have good seal. requested PRAFOs/ RUE resting handsplint/ and Prevlon boots post session. RN/MD aware. Also requested palliative care consult.      Pertinent Vitals/Pain Pain Assessment Pain Assessment: No/denies pain Faces Pain Scale: No hurt     PT Goals (current goals can now be found in the care plan section) Acute Rehab PT Goals Patient Stated Goal: none stated Progress towards PT goals: Not progressing toward goals - comment    Frequency    Min 2X/week  PT Plan Current plan remains appropriate     Co-evaluation     PT goals addressed during session: Mobility/safety with mobility;Balance;Proper use of DME;Strengthening/ROM        AM-PAC PT "6 Clicks" Mobility   Outcome Measure  Help needed turning from your back to your side while in a flat bed without using bedrails?: A Lot Help needed moving from lying on your back to sitting on the side of a flat bed without using bedrails?: A Lot Help needed moving to and from a bed to a chair (including a wheelchair)?: Total Help needed standing up from a chair using your arms (e.g., wheelchair or bedside chair)?: Total Help needed to walk in hospital room?: Total Help needed climbing 3-5 steps with a railing? : Total 6 Click Score: 8    End of Session   Activity Tolerance: Patient tolerated treatment well;Patient limited by fatigue Patient left: in bed;with call bell/phone within reach;with bed alarm set Nurse Communication: Mobility status PT Visit Diagnosis: Other abnormalities of gait and mobility (R26.89);Muscle weakness (generalized) (M62.81);Difficulty in walking, not elsewhere classified (R26.2)     Time: 1610-9604 PT Time Calculation (min) (ACUTE ONLY): 24 min  Charges:  $Therapeutic Exercise: 8-22 mins $Therapeutic Activity: 8-22 mins                    Jetta Lout PTA 11/11/22, 11:21 AM

## 2022-11-11 NOTE — Consult Note (Signed)
Consultation Note Date: 11/11/2022   Patient Name: Corey Olson  DOB: Oct 12, 1955  MRN: [id number]  Age / Sex: 67 y.o., male  PCP: [name], MD Referring Physician: Darlin Drop, DO  Reason for Consultation: Establishing goals of care   HPI/Brief Hospital Course: 67 y.o. male  with past medical history of CVA with residual right sided weakness, COPD, alcohol use disorder, HTN admitted from home on 11/01/2022 with recurrent falls, left sided weakness and left foot dragging while ambulating.   ED findings: (-) head CT, Abnormal LFT's, metabolic acidosis, positive D-dimer and elevated CPK   Left leg found to be cool with pulses to LE unable to be palpated  6/12 underwent bilateral LE angiogram 6/14 underwent endarterectomy/stent to bilateral femoral 6/16 evacuation of left groin hematoma and repair of venous branch bleed  Ongoing encephalopathy with concern for CVA and/or ETOH withdrawal MRI 6/12 IMPRESSION: 1. Punctate subacute infarct in the right cerebellar hemisphere. No acute hemorrhage or significant mass effect. 2. 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  Palliative medicine was consulted for assisting with goals of care conversations.  Subjective:  Extensive chart review has been completed prior to meeting patient including labs, vital signs, imaging, progress notes, orders, and available advanced directive documents from current and previous encounters.  Visited with Mr. Carden at his bedside. Awake and alert, oriented to himself, unclear on place, time or situation. Able to share he has a daughter that he agreed I could speak with regarding his care. No family at bedside during time of visit.  Called and spoke with sister-Margaret as well as daughter-Lakiser  Introduced myself as a Publishing rights manager as a member of the palliative care team. Explained palliative medicine  is specialized medical care for people living with serious illness. It focuses on providing relief from the symptoms and stress of a serious illness. The goal is to improve quality of life for both the patient and the family.   Claris Che confirms Mr. Youngblood has a daughter that he has some contact with. Claris Che shares, Mr. Phegley has lived with her for many years but is able to independently perform ADL's.  Called and spoke with daughter who again confirms her father has lives with his sister-Margaret for many years but is completely independent.  Shared with daughter at this time, Mr. Housholder unable to participate in goals of care conversations as he remains confused at this time. She shares her understanding and feels his confusion is likely related to ETOH withdrawal. She confirms Mr. Safi consumes ETOH on a daily basis consisting of both beer and liquor-she is unsure of his last consumption date.  Attempted to elicit goals of care. We discussed Code Status-Full Code versus Do Not Resuscitate pathways. Daughter shares she is familiar with this as she currently works in health care. She shares Mr. Boldman has never stated his wishes or made them known but at this time wishes for him to remain Full Code.  I discussed importance of continued conversations with family/support persons and all members of their medical team regarding overall plan of care and treatment options ensuring decisions are in alignment with patients goals of care.  Daughter plans to visit at bedside tomorrow and is open to continuing goals of care discussions at that time.  All questions/concerns addressed. Emotional support provided to patient/family/support persons. PMT will continue to follow and support patient as needed.  Objective: Primary Diagnoses: Present on Admission:  Essential hypertension  Alcohol abuse  Tobacco abuse  RUE weakness  PAD (peripheral artery disease) (HCC)  Non-traumatic rhabdomyolysis   Positive D dimer  Abnormal EKG  Fall  Renal artery stenosis (HCC)  Hyperlipidemia  Atherosclerosis of native arteries of extremity with intermittent claudication (HCC)  Limb ischemia   Vital Signs: BP (!) 140/91 (BP Location: Left Arm)   Pulse 93   Temp 97.9 F (36.6 C)   Resp 16   Ht 5\' 7"  (1.702 m)   Wt 76.1 kg   SpO2 100%   BMI 26.28 kg/m  Pain Scale: 0-10 POSS *See Group Information*: 1-Acceptable,Awake and alert Pain Score: 0-No pain  IO: Intake/output summary:  Intake/Output Summary (Last 24 hours) at 11/11/2022 1458 Last data filed at 11/11/2022 1610 Gross per 24 hour  Intake 210 ml  Output 750 ml  Net -540 ml    LBM: Last BM Date : 11/08/22 Baseline Weight: Weight: 72.9 kg Most recent weight: Weight: 76.1 kg      Assessment and Plan  SUMMARY OF RECOMMENDATIONS   Full Code-Full Scope Plan to meet with daughter at bedside tomorrow for ongoing GOC discussions  Thank you for this consult and allowing Palliative Medicine to participate in the care of [name]. Palliative medicine will continue to follow and assist as needed.   Time Total: 55 minutes  Time spent includes: Detailed review of medical records (labs, imaging, vital signs), medically appropriate exam (mental status, respiratory, cardiac, skin), discussed with treatment team, counseling and educating patient, family and staff, documenting clinical information, medication management and coordination of care.   Signed by: Leeanne Deed, DNP, AGNP-C Palliative Medicine    Please contact Palliative Medicine Team phone at [phone] for questions and concerns.  For individual provider: See Loretha Stapler

## 2022-11-11 NOTE — Progress Notes (Signed)
PROGRESS NOTE  Corey Olson ZOX:[id number] DOB: 1955-11-29 DOA: 11/01/2022 PCP: [name], MD  HPI/Recap of past 24 hours: Corey Olson is a 67 y.o. male with a history of stroke, peripheral atrial disease who presents with altered mental status, left leg weakness.  Family spoke to him at 4:30 and felt that he was confused.  Prior to that he had not been spoken to since the night before when he had seemed normal.  Patient reports left leg weakness dragging his left foot while walking and decreased sensation, all of which were new but without clear timelines. 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  06/11: in ED, VSS, code stroke. Admitted to hospitalist service. Elevated AST/ALT (+)hepatic steatosis, rhabdo, (+)DVT L femoral and popliteal V.  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 Found to have weakneed pulses LLE --> ischemia/severe PAD LLE, heparin gtt, RLE and LLE angiogram. Significant vascular disease, limb likely not salvageable per Dr Wyn Quaker - prognosis poor for even AKA healing, will likely need extensive endarterectomy/stenting to even salvage an AKA. Can plan for surgery 06/13: anticipate surgery tomorrow. Cardiac clearance.  06/14: endarterectomy/stents bilateral femoral  06/15: recovering in ICU on heparin infusion. Sinus tach w/ elevated temp, (+)SIRS, (+)UA concern for sepsis d/t UTI vs SIRS w/ other medical issues and also eval for PE was neg.  06/16: 1 unit PRBC, CT LLE (+)hematoma/pseudoaneurysm, to OR w/ Dr Lenell Antu for evacuation hematoma and repair venous branch bleed.  06/17: some bleeding around drain site overnight requiring few dressing changes, 1 unit PRBC ordered, hold heparin will monitor.  06/18: stable. Heparin restarted.  06/19: Heparin --> Eliquis. Wound vacs placed to groin incisions.  11/10/22: Alert but minimally  interactive  11/11/2022: The patient was seen and examined at his bedside.  He is more alert today.  He has no new complaints.    Assessment/Plan: Principal Problem:   Ischemia of extremity Active Problems:   Fall at home, initial encounter   Tobacco abuse   PAD (peripheral artery disease) (HCC)   Atherosclerosis of native arteries of extremity with intermittent claudication (HCC)   Renal artery stenosis (HCC)   Essential hypertension   Hyperlipidemia   Alcohol abuse   RUE weakness   Non-traumatic rhabdomyolysis   Positive D dimer   Abnormal EKG   Fall   Limb ischemia   Cerebrovascular accident (CVA) (HCC)   Coronary artery disease of native artery of native heart with stable angina pectoris (HCC)  LLE w/ severe PAD, ischemic L foot w/ neurological deficits likely cause for fall S/p angiograpy 06/12 and endarterectomy/stenting 06/14 Left leg still neurologically impaired from ischemia. Unlikely to recover  Vascular surgery following Per Dr Wyn Quaker, LLE may require amputation even after revascularization due to neurologic deficits. Appreciate recs re: clearance for discharge to follow outpatient vs anticipate AKA here.  JP drain in place full this morning, wound vacs replaced 6/19   SIRS - improving  (+)SIRS, w/ tachycardia, tachypnea, elevated WBC WBC is uptrending. Blood cultures x 2 no growth x 5 days Urine culture no growth. He had completed 3 days of Rocephin.   ABLA Postoperative hematoma - evacuated 06/16 Continue to closely monitor H&H Hemoglobin is stable 10.2 from 9.4.   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 HTN HLD Previous Lexiscan w/o ischemia Per cardiology note 02/2021 "high risk, if he  develops chest pain with exertion or dyspnea, will schedule left heart cath."   DVT L femoral and popliteal V  PE ruled out  Switched to Eliquis on 6/20, continue   Rhabdomyolysis w/ normal renal function  CK peaked at greater than 4400, downtrending to  1700.   History CVA w/ residual R sided deficits (+)subacute R cerebellar punctate infarct on MRI He is currently on aspirin, Eliquis, and high intensity, statin, Lipitor 80 mg daily.  Transaminitis - Hepatic Steatosis on RUQ [place] EtOH use but no acute intoxication based on blood levels - question alcoholic hepatitis  Follow CMP We were holding statin given transaminitis but risk of holding may be greater than risk to liver, will monitor LFT's closely   Weakness and fall Confusion / AMS question toxic metabolic encephalopathy or CVA Likely multifactorial Pressing concern for ischemic LLE as above w/ neuro deficits predisposing to fall  Concern for CVA see MRI brain Potential EtOH withdrawal  Treat multiple underlying issues  PT/OT as able Fall precaution    EtOH use but no acute intoxication based on blood levels  CIWA protocol  Thiamine   Refractory hypokalemia Continue to replace as needed Monitor BMP   Essential HTN lower BP to goal - window for permissive HTN w/ CVA has lapsed   Tobacco abuse Nicotine patch        DVT Tx: Eliquis 11/09/2022>>>.  Central lines / invasive devices: wound vacs   Code Status: FULL CODE    Current Admission Status: inpatient  TOC needs / Dispo plan: SNF Barriers to discharge / significant pending items: vascular clearance prior to discharge - potentially will need AKA vs ok to follow outpatient.    Status is: Inpatient The patient requires at least 2 midnights for further evaluation and treatment of present condition.    Objective: Vitals:   11/11/22 0007 11/11/22 0332 11/11/22 0809 11/11/22 1245  BP: 131/61 (!) 144/71 (!) 124/54 (!) 140/91  Pulse: 94 90 (!) 103 93  Resp: 18 (!) 22 16 16   Temp: 98.9 F (37.2 C) 99 F (37.2 C) (!) 97.5 F (36.4 C) 97.9 F (36.6 C)  TempSrc:      SpO2: 99% 100% 98% 100%  Weight:      Height:        Intake/Output Summary (Last 24 hours) at 11/11/2022 1604 Last data filed at 11/11/2022  0342 Gross per 24 hour  Intake 210 ml  Output 750 ml  Net -540 ml   Filed Weights   11/04/22 0328 11/07/22 0500 11/08/22 0500  Weight: 71.2 kg 76.3 kg 76.1 kg    Exam:  General: 67 y.o. year-old male appearing in no acute distress.  He is alert and interactive.   Cardiovascular: Regular rate and rhythm with no rubs or gallops.  No thyromegaly or JVD noted.   Respiratory: Clear to auscultation with no wheezes or rales. Good inspiratory effort. Abdomen: Soft nontender nondistended with normal bowel sounds x4 quadrants. Musculoskeletal: No lower extremity edema. 2/4 pulses in all 4 extremities. Skin: No ulcerative lesions noted or rashes, Psychiatry: Mood is appropriate for condition and setting   Data Reviewed: CBC: Recent Labs  Lab 11/06/22 0511 11/06/22 1317 11/07/22 0548 11/07/22 1702 11/09/22 0223 11/10/22 0444 11/11/22 0550  WBC 13.8*  --  16.6*  --  13.5* 14.8* 15.1*  HGB 8.2*   < > 7.7* 9.2* 9.1* 9.4* 10.2*  HCT 25.5*   < > 23.2* 26.8* 26.9* 28.3* 30.8*  MCV 91.1  --  87.2  --  85.9 86.8 88.0  PLT 177  --  178  --  246 293 347   < > = values in this interval not displayed.   Basic Metabolic Panel: Recent Labs  Lab 11/06/22 0511 11/07/22 0548 11/09/22 0223 11/10/22 0444 11/11/22 0550  NA 138 139 137 135 135  K 3.3* 3.2* 2.8* 3.3* 3.3*  CL 105 107 103 101 100  CO2 23 23 24 25 24   GLUCOSE 104* 130* 102* 95 109*  BUN 12 14 13 13 14   CREATININE 1.02 0.95 1.00 1.06 1.04  CALCIUM 7.8* 7.5* 7.3* 8.1* 7.9*  MG  --   --   --   --  1.9  PHOS  --   --   --   --  3.6   GFR: Estimated Creatinine Clearance: 64.4 mL/min (by C-G formula based on SCr of 1.04 mg/dL). Liver Function Tests: Recent Labs  Lab 11/05/22 0119 11/09/22 0223  AST 243* 161*  ALT 58* 134*  ALKPHOS 38 53  BILITOT 1.3* 0.9  PROT 6.2* 5.6*  ALBUMIN 3.6 2.6*   No results for input(s): "LIPASE", "AMYLASE" in the last 168 hours. No results for input(s): "AMMONIA" in the last 168  hours. Coagulation Profile: Recent Labs  Lab 11/08/22 1306  INR 1.1   Cardiac Enzymes: Recent Labs  Lab 11/06/22 0500 11/08/22 0421  CKTOTAL 4,484* 1,797*   BNP (last 3 results) No results for input(s): "PROBNP" in the last 8760 hours. HbA1C: No results for input(s): "HGBA1C" in the last 72 hours. CBG: Recent Labs  Lab 11/04/22 1743  GLUCAP 142*   Lipid Profile: No results for input(s): "CHOL", "HDL", "LDLCALC", "TRIG", "CHOLHDL", "LDLDIRECT" in the last 72 hours. Thyroid Function Tests: No results for input(s): "TSH", "T4TOTAL", "FREET4", "T3FREE", "THYROIDAB" in the last 72 hours. Anemia Panel: No results for input(s): "VITAMINB12", "FOLATE", "FERRITIN", "TIBC", "IRON", "RETICCTPCT" in the last 72 hours. Urine analysis:    Component Value Date/Time   COLORURINE YELLOW (A) 11/05/2022 1450   APPEARANCEUR CLOUDY (A) 11/05/2022 1450   LABSPEC 1.029 11/05/2022 1450   PHURINE 5.0 11/05/2022 1450   GLUCOSEU NEGATIVE 11/05/2022 1450   HGBUR LARGE (A) 11/05/2022 1450   BILIRUBINUR NEGATIVE 11/05/2022 1450   KETONESUR 5 (A) 11/05/2022 1450   PROTEINUR 100 (A) 11/05/2022 1450   NITRITE NEGATIVE 11/05/2022 1450   LEUKOCYTESUR MODERATE (A) 11/05/2022 1450   Sepsis Labs: @LABRCNTIP (procalcitonin:4,lacticidven:4)  ) Recent Results (from the past 240 hour(s))  Urine Culture (for pregnant, neutropenic or urologic patients or patients with an indwelling urinary catheter)     Status: None   Collection Time: 11/05/22  2:50 PM   Specimen: Urine, Clean Catch  Result Value Ref Range Status   Specimen Description   Final    URINE, CLEAN CATCH Performed at [facility], [address] [place], [place] [address]    Special Requests   Final    NONE Performed at [facility], [address], [place], [place] [address]    Culture   Final    NO GROWTH Performed at [facility], [address] [place], [place] [address]    Report Status 11/07/2022  FINAL  Final  Culture, blood (Routine X 2) w Reflex to ID Panel     Status: None   Collection Time: 11/05/22  3:26 PM   Specimen: BLOOD  Result Value Ref Range Status   Specimen Description BLOOD BLOOD RIGHT HAND  Final   Special Requests   Final  BOTTLES DRAWN AEROBIC AND ANAEROBIC Blood Culture adequate volume   Culture   Final    NO GROWTH 5 DAYS Performed at [facility], [address] [place] [place], [place] [address]    Report Status 11/10/2022 FINAL  Final  Culture, blood (Routine X 2) w Reflex to ID Panel     Status: None   Collection Time: 11/05/22  3:29 PM   Specimen: BLOOD  Result Value Ref Range Status   Specimen Description BLOOD BLOOD LEFT HAND  Final   Special Requests   Final    BOTTLES DRAWN AEROBIC AND ANAEROBIC Blood Culture adequate volume   Culture   Final    NO GROWTH 5 DAYS Performed at [facility], [address] [place], [place] [address]    Report Status 11/10/2022 FINAL  Final      Studies: No results found.  Scheduled Meds:  [START ON 11/12/2022] allopurinol  300 mg Oral Daily   apixaban  5 mg Oral BID   aspirin EC  81 mg Oral Daily   atorvastatin  80 mg Oral QPM   Chlorhexidine Gluconate Cloth  6 each Topical Daily   lisinopril  20 mg Oral Daily   metoprolol succinate  50 mg Oral Daily   nicotine  21 mg Transdermal Daily   mouth rinse  15 mL Mouth Rinse 4 times per day   pantoprazole  40 mg Oral BID   sodium chloride flush  3 mL Intravenous Q12H   thiamine  100 mg Oral Daily    Continuous Infusions:   LOS: 9 days     Darlin Drop, MD [facility] Pager [phone]  If 7PM-7AM, please contact night-coverage [email or web address] Password [facility] 11/11/2022, 4:04 PM

## 2022-11-12 DIAGNOSIS — I998 Other disorder of circulatory system: Secondary | ICD-10-CM | POA: Diagnosis not present

## 2022-11-12 DIAGNOSIS — T148XXA Other injury of unspecified body region, initial encounter: Secondary | ICD-10-CM | POA: Diagnosis not present

## 2022-11-12 DIAGNOSIS — Z7189 Other specified counseling: Secondary | ICD-10-CM | POA: Diagnosis not present

## 2022-11-12 DIAGNOSIS — I639 Cerebral infarction, unspecified: Secondary | ICD-10-CM | POA: Diagnosis not present

## 2022-11-12 DIAGNOSIS — I25118 Atherosclerotic heart disease of native coronary artery with other forms of angina pectoris: Secondary | ICD-10-CM | POA: Diagnosis not present

## 2022-11-12 MED ORDER — SENNA 8.6 MG PO TABS
1.0000 | ORAL_TABLET | Freq: Every day | ORAL | Status: DC
  Administered 2022-11-12 – 2022-11-22 (×11): 8.6 mg via ORAL
  Filled 2022-11-12 (×11): qty 1

## 2022-11-12 MED ORDER — POLYETHYLENE GLYCOL 3350 17 G PO PACK
17.0000 g | PACK | Freq: Two times a day (BID) | ORAL | Status: DC
  Administered 2022-11-12 – 2022-11-22 (×18): 17 g via ORAL
  Filled 2022-11-12 (×19): qty 1

## 2022-11-12 NOTE — Progress Notes (Signed)
    Subjective  - POD #6  No complaints   Physical Exam:  Left Prevena wound VAC has lost its seal and it was replaced using a black sponge       Assessment/Plan:  POD #6  Continue to avoid narcotics Continue Eliquis and aspirin Continue statin therapy  Corey Olson 11/12/2022 7:26 PM --  Vitals:   11/12/22 1204 11/12/22 1600  BP: (!) 123/100 (!) 106/58  Pulse: (!) 39 (!) 110  Resp: 16 16  Temp: 98.6 F (37 C) 98.3 F (36.8 C)  SpO2: 98% 99%    Intake/Output Summary (Last 24 hours) at 11/12/2022 1926 Last data filed at 11/12/2022 1900 Gross per 24 hour  Intake 0 ml  Output 600 ml  Net -600 ml     Laboratory CBC    Component Value Date/Time   WBC 15.1 (H) 11/11/2022 0550   HGB 10.2 (L) 11/11/2022 0550   HCT 30.8 (L) 11/11/2022 0550   PLT 347 11/11/2022 0550    BMET    Component Value Date/Time   NA 135 11/11/2022 0550   K 3.3 (L) 11/11/2022 0550   CL 100 11/11/2022 0550   CO2 24 11/11/2022 0550   GLUCOSE 109 (H) 11/11/2022 0550   BUN 14 11/11/2022 0550   CREATININE 1.04 11/11/2022 0550   CALCIUM 7.9 (L) 11/11/2022 0550   GFRNONAA >60 11/11/2022 0550    COAG Lab Results  Component Value Date   INR 1.1 11/08/2022   INR 1.1 11/04/2022   INR 1.0 11/01/2022   No results found for: "PTT"  Antibiotics Anti-infectives (From admission, onward)    Start     Dose/Rate Route Frequency Ordered Stop   11/05/22 1645  cefTRIAXone (ROCEPHIN) 1 g in sodium chloride 0.9 % 100 mL IVPB        1 g 200 mL/hr over 30 Minutes Intravenous Every 24 hours 11/05/22 1555 11/07/22 1653   11/04/22 0634  ceFAZolin (ANCEF) IVPB 2g/100 mL premix        2 g 200 mL/hr over 30 Minutes Intravenous 30 min pre-op 11/04/22 0634 11/04/22 1251   11/02/22 1431  ceFAZolin (ANCEF) IVPB 2g/100 mL premix        2 g 200 mL/hr over 30 Minutes Intravenous 30 min pre-op 11/02/22 1431 11/02/22 1653        V. Charlena Cross, M.D., [facility]  [place] Office: [phone] Pager:  [phone]

## 2022-11-12 NOTE — Progress Notes (Signed)
PROGRESS NOTE  Dream Nodal [id number]:[id number] DOB: 03-02-1956 DOA: 11/01/2022 PCP: [name], MD  Hospital course: Corey Olson is a 67 y.o. male with a history of stroke, peripheral atrial disease who presents with altered mental status, left leg weakness.  Family spoke to him at 4:30 and felt that he was confused.  Prior to that he had not been spoken to since the night before when he had seemed normal.  Patient reports left leg weakness dragging his left foot while walking and decreased sensation, all of which were new but without clear timelines. 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  06/11: in ED, VSS, code stroke. Admitted to hospitalist service. Elevated AST/ALT (+)hepatic steatosis, rhabdo, (+)DVT L femoral and popliteal V.  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 Found to have weakneed pulses LLE --> ischemia/severe PAD LLE, heparin gtt, RLE and LLE angiogram. Significant vascular disease, limb likely not salvageable per Dr Wyn Quaker - prognosis poor for even AKA healing, will likely need extensive endarterectomy/stenting to even salvage an AKA. Can plan for surgery 06/13: anticipate surgery tomorrow. Cardiac clearance.  06/14: endarterectomy/stents bilateral femoral  06/15: recovering in ICU on heparin infusion. Sinus tach w/ elevated temp, (+)SIRS, (+)UA concern for sepsis d/t UTI vs SIRS w/ other medical issues and also eval for PE was neg.  06/16: 1 unit PRBC, CT LLE (+)hematoma/pseudoaneurysm, to OR w/ Dr Lenell Antu for evacuation hematoma and repair venous branch bleed.  06/17: some bleeding around drain site overnight requiring few dressing changes, 1 unit PRBC ordered, hold heparin will monitor.  06/18: stable. Heparin restarted.  06/19: Heparin --> Eliquis. Wound vacs placed to groin incisions.  11/10/22: Alert but minimally  interactive  11/12/2022: patient having wound vac changed today. Family meeting with palliative today  Assessment/Plan: Principal Problem:   Ischemia of extremity Active Problems:   Fall at home, initial encounter   Tobacco abuse   PAD (peripheral artery disease) (HCC)   Atherosclerosis of native arteries of extremity with intermittent claudication (HCC)   Renal artery stenosis (HCC)   Essential hypertension   Hyperlipidemia   Alcohol abuse   RUE weakness   Non-traumatic rhabdomyolysis   Positive D dimer   Abnormal EKG   Fall   Limb ischemia   Cerebrovascular accident (CVA) (HCC)   Coronary artery disease of native artery of native heart with stable angina pectoris (HCC)  LLE w/ severe PAD, ischemic L foot w/ neurological deficits likely cause for fall S/p angiograpy 06/12 and endarterectomy/stenting 06/14 Left leg still neurologically impaired from ischemia. Unlikely to recover  Vascular surgery following- wound vac change today Per Dr Wyn Quaker, LLE may require amputation even after revascularization due to neurologic deficits. Appreciate recs re: clearance for discharge to follow outpatient vs anticipate AKA here.  L wound vac replaced 6/22. Minimal drainage on R  Postoperative hematoma - evacuated 06/16 Continue to closely monitor H&H Hemoglobin is stable 10.2 from 9.4.  Constipation- no recorded BM several days - senna, miralax - suppository PRN   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 HTN HLD Previous Lexiscan w/o ischemia Per cardiology note 02/2021 "high risk, if he develops chest pain with exertion or dyspnea, will schedule left heart cath."   DVT L femoral and popliteal V  PE ruled out  Continue Eliquis   Rhabdomyolysis w/ normal renal function  CK peaked  at greater than 4400, downtrending   History CVA w/ residual R sided deficits (+)subacute R cerebellar punctate infarct on MRI He is currently on aspirin, Eliquis, and high intensity, statin,  Lipitor 80 mg daily.  Transaminitis - Hepatic Steatosis on RUQ [place] EtOH use but no acute intoxication based on blood levels - question alcoholic hepatitis  Follow CMP We were holding statin given transaminitis but risk of holding may be greater than risk to liver, will monitor LFT's closely   Weakness and fall Confusion / AMS question toxic metabolic encephalopathy or CVA Likely multifactorial Pressing concern for ischemic LLE as above w/ neuro deficits predisposing to fall  Concern for CVA see MRI brain Potential EtOH withdrawal  Treat multiple underlying issues  PT/OT as able Fall precaution  Palliative consulted and scheduled family meeting today for GOC discussion   EtOH use but no acute intoxication based on blood levels  CIWA protocol  Thiamine replacement   Refractory hypokalemia Continue to replace as needed Monitor BMP   Essential HTN lower BP to goal - window for permissive HTN w/ CVA has lapsed   Tobacco abuse Nicotine patch     DVT Tx: Eliquis 11/09/2022>>>.  Central lines / invasive devices: wound vacs   Code Status: FULL CODE Current Admission Status: inpatient  TOC needs / Dispo plan: SNF Barriers to discharge / significant pending items: vascular clearance prior to discharge - potentially will need AKA vs ok to follow outpatient.   Status is: Inpatient The patient requires at least 2 midnights for further evaluation and treatment of present condition.  Objective: Vitals:   11/11/22 1708 11/11/22 2021 11/11/22 2335 11/12/22 0403  BP: (!) 114/46 (!) 124/52 126/70 134/72  Pulse: (!) 102 (!) 106 65 80  Resp: (!) 22 19 20 19   Temp: (!) 97.5 F (36.4 C) 97.6 F (36.4 C) 98.3 F (36.8 C) 98.8 F (37.1 C)  TempSrc:      SpO2: 99% 100% 92% 98%  Weight:      Height:        Intake/Output Summary (Last 24 hours) at 11/12/2022 0804 Last data filed at 11/12/2022 0400 Gross per 24 hour  Intake 0 ml  Output 525 ml  Net -525 ml    Filed Weights    11/04/22 0328 11/07/22 0500 11/08/22 0500  Weight: 71.2 kg 76.3 kg 76.1 kg    Exam:  General: 67 y.o. year-old male appearing in no acute distress.  He is alert and minimally interactive.   Cardiovascular: Regular rate and rhythm with no rubs or gallops.  No thyromegaly or JVD noted.   Groin: swelling firm in bilateral groin areas with wound vac in place. No bleeding in wound vac canisters.  Respiratory: Clear to auscultation with no wheezes or rales. Good inspiratory effort. Abdomen: Soft nontender nondistended with normal bowel sounds x4 quadrants. Musculoskeletal: No lower extremity edema. 2/4 pulses in all 4 extremities. Skin: No ulcerative lesions noted or rashes, Psychiatry: flat. Minimally responsive. Answers questions appropriately  Data Reviewed: CBC: Recent Labs  Lab 11/06/22 0511 11/06/22 1317 11/07/22 0548 11/07/22 1702 11/09/22 0223 11/10/22 0444 11/11/22 0550  WBC 13.8*  --  16.6*  --  13.5* 14.8* 15.1*  HGB 8.2*   < > 7.7* 9.2* 9.1* 9.4* 10.2*  HCT 25.5*   < > 23.2* 26.8* 26.9* 28.3* 30.8*  MCV 91.1  --  87.2  --  85.9 86.8 88.0  PLT 177  --  178  --  246 293 347   < > =  values in this interval not displayed.    Basic Metabolic Panel: Recent Labs  Lab 11/06/22 0511 11/07/22 0548 11/09/22 0223 11/10/22 0444 11/11/22 0550  NA 138 139 137 135 135  K 3.3* 3.2* 2.8* 3.3* 3.3*  CL 105 107 103 101 100  CO2 23 23 24 25 24   GLUCOSE 104* 130* 102* 95 109*  BUN 12 14 13 13 14   CREATININE 1.02 0.95 1.00 1.06 1.04  CALCIUM 7.8* 7.5* 7.3* 8.1* 7.9*  MG  --   --   --   --  1.9  PHOS  --   --   --   --  3.6    GFR: Estimated Creatinine Clearance: 64.4 mL/min (by C-G formula based on SCr of 1.04 mg/dL). Liver Function Tests: Recent Labs  Lab 11/09/22 0223  AST 161*  ALT 134*  ALKPHOS 53  BILITOT 0.9  PROT 5.6*  ALBUMIN 2.6*    Coagulation Profile: Recent Labs  Lab 11/08/22 1306  INR 1.1    Cardiac Enzymes: Recent Labs  Lab 11/06/22 0500  11/08/22 0421  CKTOTAL 4,484* 1,797*    Studies: No results found.  Scheduled Meds:  allopurinol  300 mg Oral Daily   apixaban  5 mg Oral BID   aspirin EC  81 mg Oral Daily   atorvastatin  80 mg Oral QPM   Chlorhexidine Gluconate Cloth  6 each Topical Daily   lisinopril  20 mg Oral Daily   metoprolol succinate  50 mg Oral Daily   nicotine  21 mg Transdermal Daily   mouth rinse  15 mL Mouth Rinse 4 times per day   pantoprazole  40 mg Oral BID   sodium chloride flush  3 mL Intravenous Q12H   thiamine  100 mg Oral Daily    Continuous Infusions:   LOS: 10 days    Corey Bock, MD [facility] Pager [phone]  If 7PM-7AM, please contact night-coverage [email or web address] Password [facility] 11/12/2022, 8:04 AM

## 2022-11-12 NOTE — Progress Notes (Signed)
Daily Progress Note   Patient Name: Corey Olson       Date: 11/12/2022 DOB: 07-05-1955  Age: 67 y.o. MRN#: [id number] Attending Physician: Corey Bock, MD Primary Care Physician: Corey Sato, MD Admit Date: 11/01/2022  Reason for Consultation/Follow-up: Establishing goals of care  HPI/Brief Hospital Review:  67 y.o. male  with past medical history of CVA with residual right sided weakness, COPD, alcohol use disorder, HTN admitted from home on 11/01/2022 with recurrent falls, left sided weakness and left foot dragging while ambulating.    ED findings: (-) head CT, Abnormal LFT's, metabolic acidosis, positive D-dimer and elevated CPK    Left leg found to be cool with pulses to LE unable to be palpated   6/12 underwent bilateral LE angiogram 6/14 underwent endarterectomy/stent to bilateral femoral 6/16 evacuation of left groin hematoma and repair of venous branch bleed   Ongoing encephalopathy with concern for CVA and/or ETOH withdrawal MRI 6/12 IMPRESSION: 1. Punctate subacute infarct in the right cerebellar hemisphere. No acute hemorrhage or significant mass effect. 2. 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   Palliative medicine was consulted for assisting with goals of care conversations.  Subjective: Extensive chart review has been completed prior to meeting patient including labs, vital signs, imaging, progress notes, orders, and available advanced directive documents from current and previous encounters.    Visited with Corey Olson at his bedside. Awake and alert, improved mentation toady. Able to answer simple orientation questions including self and place, unclear on time or situation. He confirms he lives  with his sister and can recall experiencing left leg weakness and pain that prompted him to come to hospital for evaluation.  We reviewed his hospital course at outlined above. He denies acute pain or discomfort. We discussed possible need for further intervention possibly including AKA-pending vascular assessment. He voices understanding and would wish to proceed with any and all interventions recommended.  We discussed Code Status-Full Code versus Do Not Resuscitate. Encouraged Corey Olson to consider DNR/DNI status understanding evidenced based poor outcomes in similar hospitalized patients, as the cause of the arrest is likely associated with chronic/terminal disease rather than a reversible acute cardio-pulmonary event.  At this time, he wishes to remain Full Code.  He has  not completed AD documentation in the past. In the event he is unable to speak for himself or engage in decision making he wishes to appoint both his daughter and sister as surrogate decision makers.  No family at bedside during time of visit. AD packet left at bedside-will review with family when available and recommend completion.  PMT to continue to follow for ongoing needs and support   Objective:          Vital Signs: BP (!) 122/55 (BP Location: Left Arm)   Pulse (!) 108   Temp 98.9 F (37.2 C)   Resp 16   Ht 5\' 7"  (1.702 m)   Wt 76.1 kg   SpO2 98%   BMI 26.28 kg/m  SpO2: SpO2: 98 % O2 Device: O2 Device: Room Air O2 Flow Rate: O2 Flow Rate (L/min): 6 L/min   Palliative Care Assessment & Plan   Assessment/Recommendation/Plan  Full Code AD packet left at bedside, recommend completion after review with family PMT to continue to follow for ongoing needs and support  Thank you for allowing the Palliative Medicine Team to assist in the care of this patient.  Total time:  35 minutes  Time spent includes: Detailed review of medical records (labs, imaging, vital signs), medically appropriate exam (mental  status, respiratory, cardiac, skin), discussed with treatment team, counseling and educating patient, family and staff, documenting clinical information, medication management and coordination of care.  Leeanne Deed, DNP, AGNP-C Palliative Medicine   Please contact Palliative Medicine Team phone at [phone] for questions and concerns.

## 2022-11-13 DIAGNOSIS — I639 Cerebral infarction, unspecified: Secondary | ICD-10-CM | POA: Diagnosis not present

## 2022-11-13 DIAGNOSIS — I70213 Atherosclerosis of native arteries of extremities with intermittent claudication, bilateral legs: Secondary | ICD-10-CM | POA: Diagnosis not present

## 2022-11-13 DIAGNOSIS — I25118 Atherosclerotic heart disease of native coronary artery with other forms of angina pectoris: Secondary | ICD-10-CM | POA: Diagnosis not present

## 2022-11-13 DIAGNOSIS — I998 Other disorder of circulatory system: Secondary | ICD-10-CM | POA: Diagnosis not present

## 2022-11-13 LAB — COMPREHENSIVE METABOLIC PANEL
ALT: 111 U/L — ABNORMAL HIGH (ref 0–44)
AST: 93 U/L — ABNORMAL HIGH (ref 15–41)
Albumin: 2.6 g/dL — ABNORMAL LOW (ref 3.5–5.0)
Alkaline Phosphatase: 101 U/L (ref 38–126)
Anion gap: 9 (ref 5–15)
BUN: 20 mg/dL (ref 8–23)
CO2: 26 mmol/L (ref 22–32)
Calcium: 8.7 mg/dL — ABNORMAL LOW (ref 8.9–10.3)
Chloride: 101 mmol/L (ref 98–111)
Creatinine, Ser: 1.2 mg/dL (ref 0.61–1.24)
GFR, Estimated: >60 mL/min (ref >60)
Glucose, Bld: 99 mg/dL (ref 70–99)
Potassium: 4 mmol/L (ref 3.5–5.1)
Sodium: 136 mmol/L (ref 135–145)
Total Bilirubin: 1.8 mg/dL — ABNORMAL HIGH (ref 0.3–1.2)
Total Protein: 7.4 g/dL (ref 6.5–8.1)

## 2022-11-13 LAB — CBC
HCT: 31.8 % — ABNORMAL LOW (ref 39.0–52.0)
Hemoglobin: 10.6 g/dL — ABNORMAL LOW (ref 13.0–17.0)
MCH: 29.7 pg (ref 26.0–34.0)
MCHC: 33.3 g/dL (ref 30.0–36.0)
MCV: 89.1 fL (ref 80.0–100.0)
Platelets: 526 10*3/uL — ABNORMAL HIGH (ref 150–400)
RBC: 3.57 MIL/uL — ABNORMAL LOW (ref 4.22–5.81)
RDW: 14.6 % (ref 11.5–15.5)
WBC: 17.8 10*3/uL — ABNORMAL HIGH (ref 4.0–10.5)
nRBC: 0 % (ref 0.0–0.2)

## 2022-11-13 NOTE — Progress Notes (Signed)
Third occurrence in 24 hours where the pravena wound vac was not on. It turns off by itself. Day nurse made vascular aware. Nurse will check more frequently to ensure proper function and suction.

## 2022-11-13 NOTE — Plan of Care (Signed)
  Problem: Clinical Measurements: Goal: Respiratory complications will improve Outcome: Progressing Goal: Cardiovascular complication will be avoided Outcome: Progressing   

## 2022-11-13 NOTE — Progress Notes (Signed)
Daily Progress Note   Patient Name: Corey Olson       Date: 11/13/2022 DOB: Nov 06, 1955  Age: 67 y.o. MRN#: [id number] Attending Physician: Leeroy Bock, MD Primary Care Physician: [name], MD Admit Date: 11/01/2022  Reason for Consultation/Follow-up: Establishing goals of care  HPI/Brief Hospital Review: 67 y.o. male  with past medical history of CVA with residual right sided weakness, COPD, alcohol use disorder, HTN admitted from home on 11/01/2022 with recurrent falls, left sided weakness and left foot dragging while ambulating.    ED findings: (-) head CT, Abnormal LFT's, metabolic acidosis, positive D-dimer and elevated CPK    Left leg found to be cool with pulses to LE unable to be palpated   6/12 underwent bilateral LE angiogram 6/14 underwent endarterectomy/stent to bilateral femoral 6/16 evacuation of left groin hematoma and repair of venous branch bleed   Ongoing encephalopathy with concern for CVA and/or ETOH withdrawal MRI 6/12 IMPRESSION: 1. Punctate subacute infarct in the right cerebellar hemisphere. No acute hemorrhage or significant mass effect. 2. 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   Palliative medicine was consulted for assisting with goals of care conversations.  Subjective: Extensive chart review has been completed prior to meeting patient including labs, vital signs, imaging, progress notes, orders, and available advanced directive documents from current and previous encounters.    Visited with Corey Olson at his bedside. Awake, alert and reports feeling fairly well this morning. Denies pain or discomfort, had a restful evening. Again able to share his understanding of current medical  condition. Wound vac changed last night by surgeon, aware amputation continues to be a possibility if necessary. Goals remain clear. No family at bedside during time of visit.  PMT to shadow for needs and or family engagement in completing Advanced Directives.  Thank you for allowing the Palliative Medicine Team to assist in the care of this patient.  Total time:  25 minutes  Time spent includes: Detailed review of medical records (labs, imaging, vital signs), medically appropriate exam (mental status, respiratory, cardiac, skin), discussed with treatment team, counseling and educating patient, family and staff, documenting clinical information, medication management and coordination of care.  Leeanne Deed, DNP, AGNP-C Palliative Medicine   Please contact Palliative Medicine Team  phone at [phone] for questions and concerns.

## 2022-11-13 NOTE — Progress Notes (Signed)
PROGRESS NOTE  Corey Olson NWG:[id number] DOB: Feb 19, 1956 DOA: 11/01/2022 PCP: [name], MD  Hospital course: Fisher Hargadon is a 67 y.o. male with a history of stroke, peripheral atrial disease who presents with altered mental status, left leg weakness.  Family spoke to him at 4:30 and felt that he was confused.  Prior to that he had not been spoken to since the night before when he had seemed normal.  Patient reports left leg weakness dragging his left foot while walking and decreased sensation, all of which were new but without clear timelines. 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  06/11: in ED, VSS, code stroke. Admitted to hospitalist service. Elevated AST/ALT (+)hepatic steatosis, rhabdo, (+)DVT L femoral and popliteal V.  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 Found to have weakneed pulses LLE --> ischemia/severe PAD LLE, heparin gtt, RLE and LLE angiogram. Significant vascular disease, limb likely not salvageable per Dr Wyn Quaker - prognosis poor for even AKA healing, will likely need extensive endarterectomy/stenting to even salvage an AKA. Can plan for surgery 06/13: anticipate surgery tomorrow. Cardiac clearance.  06/14: endarterectomy/stents bilateral femoral  06/15: recovering in ICU on heparin infusion. Sinus tach w/ elevated temp, (+)SIRS, (+)UA concern for sepsis d/t UTI vs SIRS w/ other medical issues and also eval for PE was neg.  06/16: 1 unit PRBC, CT LLE (+)hematoma/pseudoaneurysm, to OR w/ Dr Lenell Antu for evacuation hematoma and repair venous branch bleed.  06/17: some bleeding around drain site overnight requiring few dressing changes, 1 unit PRBC ordered, hold heparin will monitor.  06/18: stable. Heparin restarted.  06/19: Heparin --> Eliquis. Wound vacs placed to groin incisions.  11/10/22: Alert but minimally  interactive  11/12/2022: patient having wound vac changed today. Family meeting with palliative today 6/23: patient denies pain. Wound vacs are not suctioning. Awaiting vascular re-evaluation. Stable appearance from yesterday.  Assessment/Plan: Principal Problem:   Ischemia of extremity Active Problems:   Fall at home, initial encounter   Tobacco abuse   PAD (peripheral artery disease) (HCC)   Atherosclerosis of native arteries of extremity with intermittent claudication (HCC)   Renal artery stenosis (HCC)   Essential hypertension   Hyperlipidemia   Alcohol abuse   RUE weakness   Non-traumatic rhabdomyolysis   Positive D dimer   Abnormal EKG   Fall   Limb ischemia   Cerebrovascular accident (CVA) (HCC)   Coronary artery disease of native artery of native heart with stable angina pectoris (HCC)   Hematoma  LLE w/ severe PAD, ischemic L foot w/ neurological deficits likely cause for fall S/p angiograpy 06/12 and endarterectomy/stenting 06/14 Left leg still neurologically impaired from ischemia. Unlikely to recover  Vascular surgery following- wound vac change 6/22 Per Dr Wyn Quaker, LLE may require amputation even after revascularization due to neurologic deficits. Appreciate recs re: clearance for discharge to follow outpatient vs anticipate AKA here.  Continue eliquis, ASA, atorvastatin Palliative following, appreciate your care  Postoperative hematoma - evacuated 06/16. Hgb 10.6 Continue to closely monitor H&H  Constipation- resolved. BM 6/22 - senna, miralax - suppository PRN   Multivessel CAD  HTN  HLD Previous Lexiscan w/o ischemia Per cardiology note 02/2021 "high risk, if he develops chest pain with exertion or dyspnea, will schedule left heart cath."   DVT L femoral and popliteal V  PE ruled out  Continue Eliquis  Rhabdomyolysis w/ normal renal function  CK peaked at greater than 4400, downtrending   History CVA w/ residual R sided deficits (+)subacute R  cerebellar punctate infarct on MRI He is currently on aspirin, Eliquis, and high intensity, statin, Lipitor 80 mg daily.  Transaminitis - Hepatic Steatosis on RUQ [place] EtOH use but no acute intoxication based on blood levels - question alcoholic hepatitis  Follow CMP We were holding statin given transaminitis but risk of holding may be greater than risk to liver, will monitor LFT's closely   Weakness and fall Confusion / AMS question toxic metabolic encephalopathy or CVA Likely multifactorial Pressing concern for ischemic LLE as above w/ neuro deficits predisposing to fall  Concern for CVA see MRI brain Potential EtOH withdrawal  Treat multiple underlying issues  PT/OT as able Fall precaution  Palliative consulted and scheduled family meeting 6/22 for GOC discussion. He continues full code and full scope treatment   EtOH use but no acute intoxication based on blood levels. Outside withdrawal window since last known drink Thiamine replacement   Refractory hypokalemia- resolved Continue to replace as needed Monitor BMP   Essential HTN- moderately well controlled   Tobacco abuse Nicotine patch     DVT Tx: Eliquis 11/09/2022>>>.  Central lines / invasive devices: wound vacs   Code Status: FULL CODE Current Admission Status: inpatient  TOC needs / Dispo plan: SNF Barriers to discharge / significant pending items: vascular clearance prior to discharge - potentially will need AKA vs ok to follow outpatient.   Status is: Inpatient The patient requires at least 2 midnights for further evaluation and treatment of present condition.  Objective: Vitals:   11/12/22 1926 11/13/22 0006 11/13/22 0500 11/13/22 0615  BP: 125/68 (!) 97/56  (!) 117/59  Pulse: 98 (!) 101  (!) 102  Resp: 20 16  16   Temp: 98.2 F (36.8 C) 99.1 F (37.3 C)  98.7 F (37.1 C)  TempSrc:    Oral  SpO2: 98% 97%  100%  Weight:   71.7 kg   Height:        Intake/Output Summary (Last 24 hours) at 11/13/2022  0745 Last data filed at 11/13/2022 8416 Gross per 24 hour  Intake 0 ml  Output 500 ml  Net -500 ml    Filed Weights   11/07/22 0500 11/08/22 0500 11/13/22 0500  Weight: 76.3 kg 76.1 kg 71.7 kg   Exam:  General: 67 y.o. year-old male appearing in no acute distress.  He is alert and minimally interactive.   Cardiovascular: Regular rate and rhythm with no rubs or gallops.  No thyromegaly or JVD noted.   Groin: swelling firm in bilateral groin areas with wound vac in place. No bleeding in wound vac canisters.  Respiratory: Clear to auscultation with no wheezes or rales. Good inspiratory effort. Abdomen: Soft nontender nondistended with normal bowel sounds x4 quadrants. Musculoskeletal: No lower extremity edema. 2/4 pulses in all 4 extremities. Skin: No ulcerative lesions noted or rashes, Psychiatry: flat. Minimally responsive. Answers questions appropriately  Data Reviewed: CBC: Recent Labs  Lab 11/07/22 0548 11/07/22 1702 11/09/22 0223 11/10/22 0444 11/11/22 0550 11/13/22 0557  WBC 16.6*  --  13.5* 14.8* 15.1* 17.8*  HGB 7.7* 9.2* 9.1* 9.4* 10.2* 10.6*  HCT 23.2* 26.8* 26.9* 28.3* 30.8* 31.8*  MCV 87.2  --  85.9 86.8 88.0 89.1  PLT 178  --  246 293 347 526*    Basic Metabolic Panel: Recent Labs  Lab 11/07/22 0548 11/09/22 0223 11/10/22 0444 11/11/22  0550 11/13/22 0557  NA 139 137 135 135 136  K 3.2* 2.8* 3.3* 3.3* 4.0  CL 107 103 101 100 101  CO2 23 24 25 24 26   GLUCOSE 130* 102* 95 109* 99  BUN 14 13 13 14 20   CREATININE 0.95 1.00 1.06 1.04 1.20  CALCIUM 7.5* 7.3* 8.1* 7.9* 8.7*  MG  --   --   --  1.9  --   PHOS  --   --   --  3.6  --     GFR: Estimated Creatinine Clearance: 55.8 mL/min (by C-G formula based on SCr of 1.2 mg/dL). Liver Function Tests: Recent Labs  Lab 11/09/22 0223 11/13/22 0557  AST 161* 93*  ALT 134* 111*  ALKPHOS 53 101  BILITOT 0.9 1.8*  PROT 5.6* 7.4  ALBUMIN 2.6* 2.6*    Coagulation Profile: Recent Labs  Lab  11/08/22 1306  INR 1.1    Cardiac Enzymes: Recent Labs  Lab 11/08/22 0421  CKTOTAL 1,797*    Studies: No results found.  Scheduled Meds:  allopurinol  300 mg Oral Daily   apixaban  5 mg Oral BID   aspirin EC  81 mg Oral Daily   atorvastatin  80 mg Oral QPM   lisinopril  20 mg Oral Daily   metoprolol succinate  50 mg Oral Daily   nicotine  21 mg Transdermal Daily   mouth rinse  15 mL Mouth Rinse 4 times per day   pantoprazole  40 mg Oral BID   polyethylene glycol  17 g Oral BID   senna  1 tablet Oral Daily   sodium chloride flush  3 mL Intravenous Q12H   thiamine  100 mg Oral Daily    Continuous Infusions:   LOS: 11 days    Leeroy Bock, MD [facility] Pager [phone]  If 7PM-7AM, please contact night-coverage [email or web address] Password [facility] 11/13/2022, 7:45 AM

## 2022-11-13 NOTE — Progress Notes (Signed)
    Subjective  - POD #7  No overnight events   Physical Exam:  Both Prevena vacs are now working with good seal       Assessment/Plan:  POD #7  Continue to avoid narcotics Continue Eliquis and aspirin Continue statin therapy  Wells Haylen Bellotti 11/13/2022 1:53 PM --  Vitals:   11/13/22 0927 11/13/22 1126  BP: (!) 137/96 118/80  Pulse: (!) 47 (!) 103  Resp: 16 16  Temp: 99.4 F (37.4 C) 99.1 F (37.3 C)  SpO2: 99% 100%    Intake/Output Summary (Last 24 hours) at 11/13/2022 1353 Last data filed at 11/13/2022 1127 Gross per 24 hour  Intake 0 ml  Output 500 ml  Net -500 ml     Laboratory CBC    Component Value Date/Time   WBC 17.8 (H) 11/13/2022 0557   HGB 10.6 (L) 11/13/2022 0557   HCT 31.8 (L) 11/13/2022 0557   PLT 526 (H) 11/13/2022 0557    BMET    Component Value Date/Time   NA 136 11/13/2022 0557   K 4.0 11/13/2022 0557   CL 101 11/13/2022 0557   CO2 26 11/13/2022 0557   GLUCOSE 99 11/13/2022 0557   BUN 20 11/13/2022 0557   CREATININE 1.20 11/13/2022 0557   CALCIUM 8.7 (L) 11/13/2022 0557   GFRNONAA >60 11/13/2022 0557    COAG Lab Results  Component Value Date   INR 1.1 11/08/2022   INR 1.1 11/04/2022   INR 1.0 11/01/2022   No results found for: "PTT"  Antibiotics Anti-infectives (From admission, onward)    Start     Dose/Rate Route Frequency Ordered Stop   11/05/22 1645  cefTRIAXone (ROCEPHIN) 1 g in sodium chloride 0.9 % 100 mL IVPB        1 g 200 mL/hr over 30 Minutes Intravenous Every 24 hours 11/05/22 1555 11/07/22 1653   11/04/22 0634  ceFAZolin (ANCEF) IVPB 2g/100 mL premix        2 g 200 mL/hr over 30 Minutes Intravenous 30 min pre-op 11/04/22 0634 11/04/22 1251   11/02/22 1431  ceFAZolin (ANCEF) IVPB 2g/100 mL premix        2 g 200 mL/hr over 30 Minutes Intravenous 30 min pre-op 11/02/22 1431 11/02/22 1653        V. Charlena Cross, M.D., [facility] [place] Office:  [phone] Pager:  [phone]

## 2022-11-14 DIAGNOSIS — I639 Cerebral infarction, unspecified: Secondary | ICD-10-CM | POA: Diagnosis not present

## 2022-11-14 DIAGNOSIS — I998 Other disorder of circulatory system: Secondary | ICD-10-CM | POA: Diagnosis not present

## 2022-11-14 NOTE — NC FL2 (Cosign Needed Addendum)
MEDICAID FL2 LEVEL OF CARE FORM     IDENTIFICATION  Patient Name: Corey Olson Birthdate: February 01, 1956 Sex: male Admission Date (Current Location): 11/01/2022  [place] and [place] Number:  Chiropodist and Address:  [facility], [address], [place], [place] [address]      Provider Number: [id number]  Attending Physician Name and Address:  Leeroy Bock, MD  Relative Name and Phone Number:  Claris Che (sister) [phone]    Current Level of Care: Hospital Recommended Level of Care: Skilled Nursing Facility Prior Approval Number:    Date Approved/Denied:   PASRR Number: [id number]  Discharge Plan: SNF    Current Diagnoses: Patient Active Problem List   Diagnosis Date Noted   Hematoma 11/12/2022   Cerebrovascular accident (CVA) (HCC) 11/03/2022   Coronary artery disease of native artery of native heart with stable angina pectoris (HCC) 11/03/2022   Fall 11/02/2022   Ischemia of extremity 11/02/2022   Limb ischemia 11/02/2022   Fall at home, initial encounter 11/01/2022   Alcohol abuse 11/01/2022   RUE weakness 11/01/2022   Non-traumatic rhabdomyolysis 11/01/2022   Positive D dimer 11/01/2022   Abnormal EKG 11/01/2022   Atherosclerosis of native arteries of extremity with intermittent claudication (HCC) 09/07/2020   Renal artery stenosis (HCC) 09/07/2020   Essential hypertension 09/07/2020   Hyperlipidemia 09/07/2020   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   PAD (peripheral artery disease) (HCC) 07/17/2020   Primary osteoarthritis of right foot 07/09/2018   Chronic foot pain, right 06/18/2018   High risk medication use 06/18/2018   Personal history of gout 06/18/2018   Tobacco abuse 10/04/2016    Orientation RESPIRATION BLADDER Height & Weight     Self  Normal Incontinent, External catheter Weight: 151 lb 7.3 oz (68.7 kg) Height:  5\' 7"  (170.2 cm)  BEHAVIORAL SYMPTOMS/MOOD NEUROLOGICAL BOWEL NUTRITION  STATUS   (None)  (None) Continent Diet (DYS 3)  AMBULATORY STATUS COMMUNICATION OF NEEDS Skin   Extensive Assist Verbally Wound Vac, Skin abrasions, Surgical wounds (Incisions on both groins and right thigh (prevena vac). Incision on left leg: Gauze prn.)                       Personal Care Assistance Level of Assistance  Bathing, Feeding, Dressing Bathing Assistance: Maximum assistance Feeding assistance: Limited assistance Dressing Assistance: Maximum assistance   Functional Limitations Info  Sight, Hearing, Speech Sight Info: Adequate Hearing Info: Adequate Speech Info: Adequate    SPECIAL CARE FACTORS FREQUENCY  PT (By licensed PT), OT (By licensed OT)     PT Frequency: 5 x week OT Frequency: 5 x week            Contractures Contractures Info: Not present    Additional Factors Info  Code Status, Allergies Code Status Info: Full code Allergies Info: NKDA           Current Medications (11/14/2022):  This is the current hospital active medication list Current Facility-Administered Medications  Medication Dose Route Frequency Provider Last Rate Last Admin   acetaminophen (TYLENOL) tablet 650 mg  650 mg Oral Q6H PRN Annice Needy, MD   650 mg at 11/14/22 1018   Or   acetaminophen (TYLENOL) suppository 650 mg  650 mg Rectal Q6H PRN Annice Needy, MD       allopurinol (ZYLOPRIM) tablet 300 mg  300 mg Oral Daily [place], Carole N, DO   300 mg at 11/14/22 1018   apixaban (ELIQUIS) tablet  5 mg  5 mg Oral BID Pace, Brien R, NP   5 mg at 11/14/22 1018   aspirin EC tablet 81 mg  81 mg Oral Daily Annice Needy, MD   81 mg at 11/14/22 1018   atorvastatin (LIPITOR) tablet 80 mg  80 mg Oral QPM Annice Needy, MD   80 mg at 11/13/22 1800   lisinopril (ZESTRIL) tablet 20 mg  20 mg Oral Daily Dow Adolph N, DO   20 mg at 11/14/22 1018   metoprolol succinate (TOPROL-XL) 24 hr tablet 50 mg  50 mg Oral Daily Annice Needy, MD   50 mg at 11/14/22 1018   nicotine (NICODERM CQ - dosed in  mg/24 hours) patch 21 mg  21 mg Transdermal Daily Annice Needy, MD   21 mg at 11/14/22 1020   ondansetron (ZOFRAN) injection 4 mg  4 mg Intravenous Q6H PRN Rolla Plate R, NP       Oral care mouth rinse  15 mL Mouth Rinse PRN Sunnie Nielsen, DO       pantoprazole (PROTONIX) EC tablet 40 mg  40 mg Oral BID Annice Needy, MD   40 mg at 11/14/22 1018   polyethylene glycol (MIRALAX / GLYCOLAX) packet 17 g  17 g Oral BID Leeroy Bock, MD   17 g at 11/14/22 1018   senna (SENOKOT) tablet 8.6 mg  1 tablet Oral Daily Jamelle Rushing L, MD   8.6 mg at 11/14/22 1018   sodium chloride flush (NS) 0.9 % injection 3 mL  3 mL Intravenous Q12H Annice Needy, MD   3 mL at 11/14/22 1019   thiamine (VITAMIN B1) tablet 100 mg  100 mg Oral Daily Annice Needy, MD   100 mg at 11/14/22 1018     Discharge Medications: Please see discharge summary for a list of discharge medications.  Relevant Imaging Results:  Relevant Lab Results:   Additional Information SS#: [id number]  Margarito Liner, LCSW

## 2022-11-14 NOTE — Progress Notes (Signed)
PROGRESS NOTE  Corey Olson BJY:[id number] DOB: [birth year] DOA: 11/01/2022 PCP: [name], MD  Hospital course: Corey Olson is a 68 y.o. male with a history of stroke, peripheral atrial disease who presents with altered mental status, left leg weakness.  Family spoke to him at 4:30 and felt that he was confused.  Prior to that he had not been spoken to since the night before when he had seemed normal.  Patient reports left leg weakness dragging his left foot while walking and decreased sensation, all of which were new but without clear timelines. 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  06/11: in ED, VSS, code stroke. Admitted to hospitalist service. Elevated AST/ALT (+)hepatic steatosis, rhabdo, (+)DVT L femoral and popliteal V.  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 Found to have weakneed pulses LLE --> ischemia/severe PAD LLE, heparin gtt, RLE and LLE angiogram. Significant vascular disease, limb likely not salvageable per Dr Wyn Quaker - prognosis poor for even AKA healing, will likely need extensive endarterectomy/stenting to even salvage an AKA. Can plan for surgery 06/13: anticipate surgery tomorrow. Cardiac clearance.  06/14: endarterectomy/stents bilateral femoral  06/15: recovering in ICU on heparin infusion. Sinus tach w/ elevated temp, (+)SIRS, (+)UA concern for sepsis d/t UTI vs SIRS w/ other medical issues and also eval for PE was neg.  06/16: 1 unit PRBC, CT LLE (+)hematoma/pseudoaneurysm, to OR w/ Dr Lenell Antu for evacuation hematoma and repair venous branch bleed.  06/17: some bleeding around drain site overnight requiring few dressing changes, 1 unit PRBC ordered, hold heparin will monitor.  06/18: stable. Heparin restarted.  06/19: Heparin --> Eliquis. Wound vacs placed to groin incisions.  6/20-6/24: Alert but minimally interactive.  Vascular following along for management of wounds.Discharge pending completion of vascular treatments.   Assessment/Plan: Principal Problem:   Ischemia of extremity Active Problems:   Fall at home, initial encounter   Tobacco abuse   PAD (peripheral artery disease) (HCC)   Atherosclerosis of native arteries of extremity with intermittent claudication (HCC)   Renal artery stenosis (HCC)   Essential hypertension   Hyperlipidemia   Alcohol abuse   RUE weakness   Non-traumatic rhabdomyolysis   Positive D dimer   Abnormal EKG   Fall   Limb ischemia   Cerebrovascular accident (CVA) (HCC)   Coronary artery disease of native artery of native heart with stable angina pectoris (HCC)   Hematoma  LLE w/ severe PAD, ischemic L foot w/ neurological deficits likely cause for fall S/p angiograpy 06/12 and endarterectomy/stenting 06/14 Left leg still neurologically impaired from ischemia. Unlikely to recover  Vascular surgery following- wound vac change 6/22 Per Dr Wyn Quaker, LLE may require amputation even after revascularization due to neurologic deficits. Appreciate recs re: clearance for discharge to follow outpatient vs anticipate AKA here.  Continue eliquis, ASA, atorvastatin Palliative following, appreciate your care  Postoperative hematoma - evacuated 06/16. Hgb 10.6 Continue to closely monitor H&H  Constipation- resolved. BM 6/22 - senna, miralax - suppository PRN   Multivessel CAD  HTN  HLD Previous Lexiscan w/o ischemia Per cardiology note 02/2021 "high risk, if he develops chest pain with exertion or dyspnea, will schedule left heart cath."   DVT L femoral and popliteal V  PE ruled out  Continue Eliquis   Rhabdomyolysis w/ normal renal function  CK peaked at greater than 4400, downtrending  History CVA w/ residual R sided deficits (+)subacute R cerebellar punctate infarct on MRI He is currently on aspirin, Eliquis, and high intensity, statin, Lipitor 80 mg  daily.  Transaminitis - Hepatic Steatosis on RUQ [place] EtOH use but no acute intoxication based on blood levels - question alcoholic hepatitis  Follow CMP We were holding statin given transaminitis but risk of holding may be greater than risk to liver, will monitor LFT's closely   Weakness and fall Confusion / AMS question toxic metabolic encephalopathy or CVA Likely multifactorial Pressing concern for ischemic LLE as above w/ neuro deficits predisposing to fall  Concern for CVA see MRI brain Potential EtOH withdrawal  Treat multiple underlying issues  PT/OT as able Fall precaution  Palliative consulted and scheduled family meeting 6/22 for GOC discussion. He continues full code and full scope treatment   EtOH use but no acute intoxication based on blood levels. Outside withdrawal window since last known drink Thiamine replacement   Refractory hypokalemia- resolved Continue to replace as needed Monitor BMP   Essential HTN- moderately well controlled   Tobacco abuse Nicotine patch     DVT Tx: Eliquis 11/09/2022>>>.  Central lines / invasive devices: wound vacs   Code Status: FULL CODE Current Admission Status: inpatient  TOC needs / Dispo plan: SNF Barriers to discharge / significant pending items: vascular clearance prior to discharge - potentially will need AKA vs ok to follow outpatient.   Status is: Inpatient The patient requires at least 2 midnights for further evaluation and treatment of present condition.  Objective: Vitals:   11/13/22 1613 11/13/22 2020 11/13/22 2357 11/14/22 0500  BP: 119/75 122/69 137/66   Pulse: 100 96 (!) 103   Resp: 16 16 18    Temp: 100.1 F (37.8 C) 99.2 F (37.3 C) 98.3 F (36.8 C)   TempSrc:  Oral Oral   SpO2: 99% 98% 94%   Weight:    68.7 kg  Height:        Intake/Output Summary (Last 24 hours) at 11/14/2022 1191 Last data filed at 11/13/2022 1809 Gross per 24 hour  Intake 180 ml  Output 200 ml  Net -20 ml    Filed Weights    11/08/22 0500 11/13/22 0500 11/14/22 0500  Weight: 76.1 kg 71.7 kg 68.7 kg   Exam:  General: 67 y.o. year-old male appearing in no acute distress.  He is alert and minimally interactive.   Cardiovascular: Regular rate and rhythm with no rubs or gallops.  No thyromegaly or JVD noted.   Groin: swelling firm in bilateral groin areas with wound vac in place. No bleeding in wound vac canisters.  Respiratory: Clear to auscultation with no wheezes or rales. Good inspiratory effort. Abdomen: Soft nontender nondistended with normal bowel sounds x4 quadrants. Musculoskeletal: No lower extremity edema. 2/4 pulses in all 4 extremities. Skin: No ulcerative lesions noted or rashes, Psychiatry: flat. Minimally responsive. Answers questions appropriately  Data Reviewed: CBC: Recent Labs  Lab 11/07/22 1702 11/09/22 0223 11/10/22 0444 11/11/22 0550 11/13/22 0557  WBC  --  13.5* 14.8* 15.1* 17.8*  HGB 9.2* 9.1* 9.4* 10.2* 10.6*  HCT 26.8* 26.9* 28.3* 30.8* 31.8*  MCV  --  85.9 86.8 88.0 89.1  PLT  --  246 293 347 526*    Basic Metabolic Panel: Recent Labs  Lab 11/09/22 0223 11/10/22 0444 11/11/22 0550 11/13/22 0557  NA 137 135 135 136  K 2.8* 3.3* 3.3* 4.0  CL 103 101 100 101  CO2 24 25 24  26  GLUCOSE 102* 95 109* 99  BUN 13 13 14 20   CREATININE 1.00 1.06 1.04 1.20  CALCIUM 7.3* 8.1* 7.9* 8.7*  MG  --   --  1.9  --   PHOS  --   --  3.6  --     GFR: Estimated Creatinine Clearance: 55.8 mL/min (by C-G formula based on SCr of 1.2 mg/dL). Liver Function Tests: Recent Labs  Lab 11/09/22 0223 11/13/22 0557  AST 161* 93*  ALT 134* 111*  ALKPHOS 53 101  BILITOT 0.9 1.8*  PROT 5.6* 7.4  ALBUMIN 2.6* 2.6*    Coagulation Profile: Recent Labs  Lab 11/08/22 1306  INR 1.1    Cardiac Enzymes: Recent Labs  Lab 11/08/22 0421  CKTOTAL 1,797*    Studies: No results found.  Scheduled Meds:  allopurinol  300 mg Oral Daily   apixaban  5 mg Oral BID   aspirin EC  81 mg  Oral Daily   atorvastatin  80 mg Oral QPM   lisinopril  20 mg Oral Daily   metoprolol succinate  50 mg Oral Daily   nicotine  21 mg Transdermal Daily   pantoprazole  40 mg Oral BID   polyethylene glycol  17 g Oral BID   senna  1 tablet Oral Daily   sodium chloride flush  3 mL Intravenous Q12H   thiamine  100 mg Oral Daily    Continuous Infusions:   LOS: 12 days    Leeroy Bock, MD [facility] Pager [phone]  If 7PM-7AM, please contact night-coverage [email or web address] Password [facility] 11/14/2022, 8:07 AM

## 2022-11-14 NOTE — TOC Progression Note (Signed)
Transition of Care [facility]) - Progression Note    Patient Details  Name: Corey Olson MRN: [id number] Date of Birth: 08-07-1955  Transition of Care [facility]) CM/SW Contact  Margarito Liner, LCSW Phone Number: 11/14/2022, 3:16 PM  Clinical Narrative: CSW updated FL2 to include prevena wound vac and sent back out to local SNF's. Left a message for [facility] admissions coordinator asking her to review.    Expected Discharge Plan and Services                                               Social Determinants of Health (SDOH) Interventions SDOH Screenings   Food Insecurity: No Food Insecurity (11/02/2022)  Housing: Low Risk  (11/02/2022)  Transportation Needs: No Transportation Needs (11/02/2022)  Utilities: Not At Risk (11/02/2022)  Tobacco Use: High Risk (11/09/2022)    Readmission Risk Interventions     No data to display

## 2022-11-14 NOTE — Progress Notes (Signed)
Physical Therapy Treatment Patient Details Name: Corey Olson MRN: [id number] DOB: 26-Nov-1955 Today's Date: 11/14/2022   History of Present Illness Pt is a 67 yo male s/p bilateral femoral endarterectomy and iliac stenting. 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 PMH of COPD, gout, HLD, HTN, PVD, CVA, etoh.    PT Comments    Patient is agreeable to PT. He is confused but cooperative. He continues to require physical assistance for bed mobility. Sitting balance is poor initially with external support required to maintain sitting balance, progressing to fair with facilitation for anterior weight shifting. Sitting tolerance is limited by fatigue. Recommend to continue PT to maximize independence and facilitate return to prior level of function. Anticipate the need for frequent supervision/assistance after this hospital stay.    Recommendations for follow up therapy are one component of a multi-disciplinary discharge planning process, led by the attending physician.  Recommendations may be updated based on patient status, additional functional criteria and insurance authorization.  Follow Up Recommendations  Can patient physically be transported by private vehicle: No    Assistance Recommended at Discharge Frequent or constant Supervision/Assistance  Patient can return home with the following Two people to help with walking and/or transfers;Two people to help with bathing/dressing/bathroom;Assistance with cooking/housework;Assistance with feeding;Direct supervision/assist for medications management;Direct supervision/assist for financial management;Assist for transportation;Help with stairs or ramp for entrance   Equipment Recommendations  Other (comment)    Recommendations for Other Services       Precautions / Restrictions Precautions Precautions:  Fall Restrictions Weight Bearing Restrictions: No     Mobility  Bed Mobility Overal bed mobility: Needs Assistance Bed Mobility: Sit to Supine, Supine to Sit     Supine to sit: Max assist Sit to supine: Max assist   General bed mobility comments: assistance for BLE and trunk support. cues for task initiation and sequencing. increased time and effort required    Transfers                   General transfer comment: not attempted as patient requesting to return to supine not long after sitting upright    Ambulation/Gait                   Stairs             Wheelchair Mobility    Modified Rankin (Stroke Patients Only)       Balance Overall balance assessment: Needs assistance Sitting-balance support: Feet supported Sitting balance-Leahy Scale: Poor Sitting balance - Comments: poor initially progressing to fair with increased sitting time. cues for anterior weight shifting with core activation Postural control: Posterior lean                                  Cognition Arousal/Alertness: Awake/alert Behavior During Therapy: Flat affect Overall Cognitive Status: No family/caregiver present to determine baseline cognitive functioning                   Orientation Level: Disoriented to, Place, Time, Situation   Memory: Decreased recall of precautions, Decreased short-term memory Following Commands: Follows one step commands with increased time Safety/Judgement: Decreased awareness of safety, Decreased awareness of deficits              Exercises  General Comments General comments (skin integrity, edema, etc.): PRAFO in the room now. patient had pain with passive dorsiflexion of RLE so PRAFO was not donned at this time.      Pertinent Vitals/Pain Pain Assessment Pain Assessment: Faces Faces Pain Scale: Hurts little more Pain Location: "legs" - unable to specify further without prompting Pain Descriptors /  Indicators: Grimacing, Guarding, Moaning Pain Intervention(s): Limited activity within patient's tolerance, Monitored during session, Repositioned    Home Living                          Prior Function            PT Goals (current goals can now be found in the care plan section) Acute Rehab PT Goals Patient Stated Goal: none stated PT Goal Formulation: Patient unable to participate in goal setting Time For Goal Achievement: 11/19/22 Potential to Achieve Goals: Fair Progress towards PT goals: Progressing toward goals    Frequency    Min 2X/week      PT Plan Current plan remains appropriate    Co-evaluation              AM-PAC PT "6 Clicks" Mobility   Outcome Measure  Help needed turning from your back to your side while in a flat bed without using bedrails?: A Lot Help needed moving from lying on your back to sitting on the side of a flat bed without using bedrails?: A Lot Help needed moving to and from a bed to a chair (including a wheelchair)?: Total Help needed standing up from a chair using your arms (e.g., wheelchair or bedside chair)?: Total Help needed to walk in hospital room?: Total Help needed climbing 3-5 steps with a railing? : Total 6 Click Score: 8    End of Session   Activity Tolerance: Patient limited by fatigue Patient left: in bed;with call bell/phone within reach;with bed alarm set   PT Visit Diagnosis: Other abnormalities of gait and mobility (R26.89);Muscle weakness (generalized) (M62.81);Difficulty in walking, not elsewhere classified (R26.2)     Time: 8127-5170 PT Time Calculation (min) (ACUTE ONLY): 11 min  Charges:  $Therapeutic Activity: 8-22 mins                    Donna Bernard, PT, MPT    Ina Homes 11/14/2022, 9:56 AM

## 2022-11-14 NOTE — Plan of Care (Signed)
  Problem: Education: Goal: Knowledge of disease or condition will improve Outcome: Not Progressing Goal: Knowledge of secondary prevention will improve (MUST DOCUMENT ALL) Outcome: Not Progressing Goal: Knowledge of patient specific risk factors will improve Loraine Leriche N/A or DELETE if not current risk factor) Outcome: Not Progressing   Problem: Ischemic Stroke/TIA Tissue Perfusion: Goal: Complications of ischemic stroke/TIA will be minimized Outcome: Progressing   Problem: Coping: Goal: Will verbalize positive feelings about self Outcome: Not Progressing Goal: Will identify appropriate support needs Outcome: Not Progressing   Problem: Health Behavior/Discharge Planning: Goal: Ability to manage health-related needs will improve Outcome: Not Progressing Goal: Goals will be collaboratively established with patient/family Outcome: Not Progressing   Problem: Self-Care: Goal: Ability to participate in self-care as condition permits will improve Outcome: Not Progressing Goal: Verbalization of feelings and concerns over difficulty with self-care will improve Outcome: Not Progressing Goal: Ability to communicate needs accurately will improve Outcome: Not Progressing

## 2022-11-15 DIAGNOSIS — M6282 Rhabdomyolysis: Secondary | ICD-10-CM | POA: Diagnosis not present

## 2022-11-15 DIAGNOSIS — W19XXXA Unspecified fall, initial encounter: Secondary | ICD-10-CM | POA: Diagnosis not present

## 2022-11-15 DIAGNOSIS — I639 Cerebral infarction, unspecified: Secondary | ICD-10-CM | POA: Diagnosis not present

## 2022-11-15 DIAGNOSIS — I998 Other disorder of circulatory system: Secondary | ICD-10-CM | POA: Diagnosis not present

## 2022-11-15 NOTE — Progress Notes (Signed)
Occupational Therapy Treatment Patient Details Name: Corey Olson MRN: [id number] DOB: 1955-07-16 Today's Date: 11/15/2022   History of present illness Pt is a 67 yo male s/p bilateral femoral endarterectomy and iliac stenting. 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 PMH of COPD, gout, HLD, HTN, PVD, CVA, etoh.   OT comments  Pt seen for OT tx. Pt agreeable to session. Pt endorses BLE pain and RUE pain. Pt tolerated AAROM and stretching for RUE with noted increased tone with extention. OT was able to achieve elbow extension to ~75*, wrist extention to neutral but resting in ~45* flexion, and shoulder abduction to 45*, composite finger extention to neutral, unable to tolerate composite finger flexion. Pt unable to tolerate any more 2/2 pain. Pt continues to benefit from skilled OT services.    Recommendations for follow up therapy are one component of a multi-disciplinary discharge planning process, led by the attending physician.  Recommendations may be updated based on patient status, additional functional criteria and insurance authorization.    Assistance Recommended at Discharge Frequent or constant Supervision/Assistance  Patient can return home with the following  Two people to help with walking and/or transfers;Two people to help with bathing/dressing/bathroom;Assistance with cooking/housework;Direct supervision/assist for medications management;Direct supervision/assist for financial management;Assist for transportation;Help with stairs or ramp for entrance   Equipment Recommendations  Other (comment) (defer to next venue)    Recommendations for Other Services      Precautions / Restrictions Precautions Precautions: Fall Precaution Comments: old R hemi, weakness globally Restrictions Weight Bearing Restrictions: No Other Position/Activity  Restrictions: B proximal thigh wound vacs intact       Mobility Bed Mobility                    Transfers                         Balance                                           ADL either performed or assessed with clinical judgement   ADL                                              Extremity/Trunk Assessment              Vision       Perception     Praxis      Cognition Arousal/Alertness: Awake/alert Behavior During Therapy: Flat affect Overall Cognitive Status: No family/caregiver present to determine baseline cognitive functioning                                 General Comments: Pt able to indicate he was having pain and knew his bday but unable to give number for pain, difficulty following simple commands        Exercises Other Exercises Other Exercises: AAROM and stretching for RUE with noted increased tone with extention. OT was able to achieve elbow extension to ~75*, wrist extention to neutral but resting in ~45* flexion, and  shoulder abduction to 45*, composite finger extention to neutral, unable to tolerate composite finger flexion. Pt unable to tolerate any more 2/2 pain.    Shoulder Instructions       General Comments      Pertinent Vitals/ Pain       Pain Assessment Pain Assessment: Faces Faces Pain Scale: Hurts even more Pain Location: BLE and RUE Pain Descriptors / Indicators: Grimacing, Guarding, Moaning Pain Intervention(s): Limited activity within patient's tolerance, Monitored during session, Repositioned, Patient requesting pain meds-RN notified  Home Living                                          Prior Functioning/Environment              Frequency  Min 2X/week        Progress Toward Goals  OT Goals(current goals can now be found in the care plan section)  Progress towards OT goals: OT to reassess next treatment  Acute  Rehab OT Goals Patient Stated Goal: to feel better OT Goal Formulation: With patient Time For Goal Achievement: 11/19/22 Potential to Achieve Goals: Fair  Plan Discharge plan remains appropriate;Frequency needs to be updated    Co-evaluation                 AM-PAC OT "6 Clicks" Daily Activity     Outcome Measure   Help from another person eating meals?: A Lot Help from another person taking care of personal grooming?: A Lot Help from another person toileting, which includes using toliet, bedpan, or urinal?: A Lot Help from another person bathing (including washing, rinsing, drying)?: A Lot Help from another person to put on and taking off regular upper body clothing?: A Lot Help from another person to put on and taking off regular lower body clothing?: A Lot 6 Click Score: 12    End of Session    OT Visit Diagnosis: Other abnormalities of gait and mobility (R26.89);Muscle weakness (generalized) (M62.81)   Activity Tolerance Patient limited by pain   Patient Left in bed;with call bell/phone within reach;with bed alarm set   Nurse Communication Mobility status;Patient requests pain meds        Time: 0981-1914 OT Time Calculation (min): 12 min  Charges: OT General Charges $OT Visit: 1 Visit OT Treatments $Therapeutic Exercise: 8-22 mins  Arman Filter., MPH, MS, OTR/L ascom [phone] 11/15/22, 4:18 PM

## 2022-11-15 NOTE — Progress Notes (Signed)
PROGRESS NOTE  Corey Olson QMV:[id number] DOB: May 09, 1956 DOA: 11/01/2022 PCP: [name], MD  Hospital course: Corey Olson is a 67 y.o. male with a history of stroke, peripheral atrial disease who presents with altered mental status, left leg weakness.  Family spoke to him at 4:30 and felt that he was confused.  Prior to that he had not been spoken to since the night before when he had seemed normal.  Patient reports left leg weakness dragging his left foot while walking and decreased sensation, all of which were new but without clear timelines. 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  06/11: in ED, VSS, code stroke. Admitted to hospitalist service. Elevated AST/ALT (+)hepatic steatosis, rhabdo, (+)DVT L femoral and popliteal V.  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 Found to have weakneed pulses LLE --> ischemia/severe PAD LLE, heparin gtt, RLE and LLE angiogram. Significant vascular disease, limb likely not salvageable per Dr Wyn Quaker - prognosis poor for even AKA healing, will likely need extensive endarterectomy/stenting to even salvage an AKA. Can plan for surgery 06/13: anticipate surgery tomorrow. Cardiac clearance.  06/14: endarterectomy/stents bilateral femoral  06/15: recovering in ICU on heparin infusion. Sinus tach w/ elevated temp, (+)SIRS, (+)UA concern for sepsis d/t UTI vs SIRS w/ other medical issues and also eval for PE was neg.  06/16: 1 unit PRBC, CT LLE (+)hematoma/pseudoaneurysm, to OR w/ Dr Lenell Antu for evacuation hematoma and repair venous branch bleed.  06/17: some bleeding around drain site overnight requiring few dressing changes, 1 unit PRBC ordered, hold heparin will monitor.  06/18: stable. Heparin restarted.  06/19: Heparin --> Eliquis. Wound vacs placed to groin incisions.  6/20-6/24: Alert but minimally interactive.  Vascular following along for management of wounds.Discharge pending completion of vascular treatments.  6/25: per vascular note: Vascular Surgery is okay with patient discharge today. Okay to discontinue right groin Prevena Wound Vac tomorrow 11/16/2022. Left groin wound vac will need to stay in place until 21 days post op and staple removal. Continue to avoid narcotics as this makes the patient confused and agitated.Continue Eliquis 5 mg twice daily and ASA 81 mg daily. Continue to work with PT and OT.  No further interventions planned. Patient is medically stable for discharge when discharge location is available. Palliative is on board for further GOC discussions with family, thank you for your support. Patient denies pain today. TOC is engaged and actively working on placement. PT/OT continue to provide therapy. Continues on chronic home meds in addition to those changed from vascular.   Assessment/Plan: Principal Problem:   Ischemia of extremity Active Problems:   Fall at home, initial encounter   Tobacco abuse   PAD (peripheral artery disease) (HCC)   Atherosclerosis of native arteries of extremity with intermittent claudication (HCC)   Renal artery stenosis (HCC)   Essential hypertension   Hyperlipidemia   Alcohol abuse   RUE weakness   Non-traumatic rhabdomyolysis   Positive D dimer   Abnormal EKG   Fall   Limb ischemia   Cerebrovascular accident (CVA) (HCC)   Coronary artery disease of native artery of native heart with stable angina pectoris (HCC)   Hematoma  LLE w/ severe PAD, ischemic L foot w/ neurological deficits likely cause for fall S/p angiograpy 06/12 and endarterectomy/stenting 06/14 Left leg weakness. Denies pain at site of post-op hematoma and the swelling  in L groin has been stable throughout my evaluation period. He continues to deny pain. PT recommending further therapy after dc. SNF placement pending. Vascular surgery following- states he is ready to dc today  with outpatient follow up and continued wound vac to left groin.  Continue eliquis, ASA, atorvastatin Palliative following, appreciate your care  Postoperative hematoma - evacuated 06/16. Hgb 10.6. stable appearance. Pain free. Continue to closely monitor H&H  Constipation- resolved. BM 6/25 - senna, miralax - suppository PRN   Multivessel CAD  HTN  HLD Previous Lexiscan w/o ischemia Per cardiology note 02/2021 "high risk, if he develops chest pain with exertion or dyspnea, will schedule left heart cath."   DVT L femoral and popliteal V  PE ruled out  Continue Eliquis   Rhabdomyolysis w/ normal renal function  CK peaked at greater than 4400, downtrending   History CVA w/ residual R sided deficits (+)subacute R cerebellar punctate infarct on MRI He is currently on aspirin, Eliquis, and high intensity, statin, Lipitor 80 mg daily.  Transaminitis - Hepatic Steatosis on RUQ [place] EtOH use but no acute intoxication based on blood levels - question alcoholic hepatitis  Follow CMP We were holding statin given transaminitis but risk of holding may be greater than risk to liver, will monitor LFT's closely   Weakness and fall Confusion / AMS question toxic metabolic encephalopathy or CVA Likely multifactorial. Pressing concern for ischemic LLE as above w/ neuro deficits predisposing to fall  Concern for CVA see MRI brain Potential EtOH withdrawal  Treat multiple underlying issues  PT/OT as able Fall precaution  Palliative consulted for GOC discussion. He continues full code and full scope treatment   EtOH use but no acute intoxication based on blood levels. Outside withdrawal window since last known drink Thiamine replacement   Refractory hypokalemia- resolved Continue to replace as needed Monitor BMP   Essential HTN- moderately well controlled   Tobacco abuse Nicotine patch     DVT Tx: Eliquis 11/09/2022>>>.  Central lines / invasive devices: wound vacs   Code Status:  FULL CODE Current Admission Status: inpatient  TOC needs / Dispo plan: SNF Barriers to discharge / significant pending items: medically stable for dc  Status is: Inpatient The patient requires at least 2 midnights for further evaluation and treatment of present condition.  Objective: Vitals:   11/14/22 1624 11/15/22 0014 11/15/22 0337 11/15/22 0737  BP: 108/68 116/66 133/73 129/71  Pulse: (!) 101 92 88 100  Resp: 18 15 16 18   Temp: 98.9 F (37.2 C) 98.1 F (36.7 C) 98.1 F (36.7 C) 98.9 F (37.2 C)  TempSrc: Oral     SpO2: 98% 94% 98% 98%  Weight:      Height:        Intake/Output Summary (Last 24 hours) at 11/15/2022 0820 Last data filed at 11/14/2022 1854 Gross per 24 hour  Intake 240 ml  Output 800 ml  Net -560 ml    Filed Weights   11/08/22 0500 11/13/22 0500 11/14/22 0500  Weight: 76.1 kg 71.7 kg 68.7 kg   Exam:  General: 67 y.o. year-old male appearing in no acute distress.  He is alert and minimally interactive.   Cardiovascular: Regular rate and rhythm with no rubs or gallops.  No thyromegaly or JVD noted.   Groin: swelling firm in bilateral groin areas with wound vac in place. No bleeding in wound vac canisters. Hematoma stable Respiratory: Clear to auscultation with no wheezes or rales. Good inspiratory effort. Musculoskeletal: No lower  extremity edema. Skin: No ulcerative lesions noted or rashes, Psychiatry: flat. Minimally responsive. Answers questions appropriately  Data Reviewed: CBC: Recent Labs  Lab 11/09/22 0223 11/10/22 0444 11/11/22 0550 11/13/22 0557  WBC 13.5* 14.8* 15.1* 17.8*  HGB 9.1* 9.4* 10.2* 10.6*  HCT 26.9* 28.3* 30.8* 31.8*  MCV 85.9 86.8 88.0 89.1  PLT 246 293 347 526*    Basic Metabolic Panel: Recent Labs  Lab 11/09/22 0223 11/10/22 0444 11/11/22 0550 11/13/22 0557  NA 137 135 135 136  K 2.8* 3.3* 3.3* 4.0  CL 103 101 100 101  CO2 24 25 24 26   GLUCOSE 102* 95 109* 99  BUN 13 13 14 20   CREATININE 1.00 1.06 1.04  1.20  CALCIUM 7.3* 8.1* 7.9* 8.7*  MG  --   --  1.9  --   PHOS  --   --  3.6  --     GFR: Estimated Creatinine Clearance: 55.8 mL/min (by C-G formula based on SCr of 1.2 mg/dL). Liver Function Tests: Recent Labs  Lab 11/09/22 0223 11/13/22 0557  AST 161* 93*  ALT 134* 111*  ALKPHOS 53 101  BILITOT 0.9 1.8*  PROT 5.6* 7.4  ALBUMIN 2.6* 2.6*   Coagulation Profile: Recent Labs  Lab 11/08/22 1306  INR 1.1    Studies: No results found.  Scheduled Meds:  allopurinol  300 mg Oral Daily   apixaban  5 mg Oral BID   aspirin EC  81 mg Oral Daily   atorvastatin  80 mg Oral QPM   lisinopril  20 mg Oral Daily   metoprolol succinate  50 mg Oral Daily   nicotine  21 mg Transdermal Daily   pantoprazole  40 mg Oral BID   polyethylene glycol  17 g Oral BID   senna  1 tablet Oral Daily   sodium chloride flush  3 mL Intravenous Q12H   thiamine  100 mg Oral Daily    Continuous Infusions:   LOS: 13 days    Leeroy Bock, MD [facility] Pager [phone]  If 7PM-7AM, please contact night-coverage [email or web address] Password [email or web address] 11/15/2022, 8:20 AM

## 2022-11-15 NOTE — TOC Progression Note (Addendum)
Transition of Care [facility]) - Progression Note    Patient Details  Name: Corey Olson MRN: [id number] Date of Birth: [birth year]  Transition of [facility] CM/SW Contact  Margarito Liner, LCSW Phone Number: 11/15/2022, 1:51 PM  Clinical Narrative: Still unable to reach [facility] regarding a bed offer for this patient. [facility] is confirming they can still offer a bed. Left messages for [facility] and [facility] to confirm they can still offer.    4:17 pm: Reviewed bed offers with daughter. She will discuss with her aunt and follow up with decision.  Expected Discharge Plan and Services                                               Social Determinants of Health (SDOH) Interventions SDOH Screenings   Food Insecurity: No Food Insecurity (11/02/2022)  Housing: Low Risk  (11/02/2022)  Transportation Needs: No Transportation Needs (11/02/2022)  Utilities: Not At Risk (11/02/2022)  Tobacco Use: High Risk (11/09/2022)    Readmission Risk Interventions     No data to display

## 2022-11-15 NOTE — Care Management Important Message (Signed)
Important Message  Patient Details  Name: Corey Olson MRN: [id number] Date of Birth: 1956/02/24   Medicare Important Message Given:  Yes     [name] 11/15/2022, 10:39 AM

## 2022-11-15 NOTE — Progress Notes (Signed)
I was called to the patients bedside to fix or reapply the wound vac to the patients left groin. I replaced the vac sponge today with clean new sponge. It's attached to a regular wound vac. Suction is good and its working well.   Please keep the patients arms restrained and hands in mittens as he continues to pull the vac sponge off.

## 2022-11-15 NOTE — Progress Notes (Addendum)
Palliative Care Progress Note, Assessment & Plan   Patient Name: Corey Olson       Date: 11/15/2022 DOB: April 08, 1956  Age: 67 y.o. MRN#: [id number] Attending Physician: Corey Bock, MD Primary Care Physician: Corey Sato, MD Admit Date: 11/01/2022  Subjective: Patient is lying in bed in no apparent distress.  Mittens are in place.  He acknowledges my presence and is able to make his wishes known.  Wound vacs in place, sealed, and no signs of leakage/machine shutting off noted.  No family or friends present during my visit.  HPI: 67 y.o. male  with past medical history of CVA with residual right sided weakness, COPD, alcohol use disorder, HTN admitted from home on 11/01/2022 with recurrent falls, left sided weakness and left foot dragging while ambulating.    ED findings: (-) head CT, Abnormal LFT's, metabolic acidosis, positive D-dimer and elevated CPK    Left leg found to be cool with pulses to LE unable to be palpated   6/12 underwent bilateral LE angiogram 6/14 underwent endarterectomy/stent to bilateral femoral 6/16 evacuation of left groin hematoma and repair of venous branch bleed   Ongoing encephalopathy with concern for CVA and/or ETOH withdrawal MRI 6/12 IMPRESSION: 1. Punctate subacute infarct in the right cerebellar hemisphere. No acute hemorrhage or significant mass effect. 2. 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   Palliative medicine was consulted for assisting with goals of care conversations.  Summary of counseling/coordination of care: After reviewing the patient's chart and assessing the patient at bedside, I spoke with patient in regards to plan and goals of care.  Patient shares he is feeling "alright".  He repeats the answer "alright" several times and does not ellaborate with his responses.  He cannot verbalize his current medical situation.  He could not share with me the events of his hospitalization or plan of care moving forward.  Symptoms assessed.  Patient denied pain or discomfort at this time.  No adjustments to [facility] needed at this time.  After assessing the patient, I spoke with patient's daughter-in-law Corey Olson over the phone.  We discussed plan and goals of care for patient.  She shares she knows she is the sole decision-maker in the event that patient is unable to speak for himself.  However, she is in agreement to allow his sister/her aunt to be made aware of patient's medical situation.  We discussed importance advance care planning.  Patient does not have HCPOA or living will.  Corey Olson shares she and her father have never discussed his advanced wishes.  Concepts such as code status, artificial hydration and nutrition, and use of mechanical ventilatory support reviewed.  Corey Olson is not prepared to make changes to patient's plan of care at this time but is open to future discussions.    She plans to be bedside on Friday morning and we will meet then.  Full code and full scope remain for now.  I also offered a copy of hard choices for loving people and MOST form for her review.  She shares she would like to review these bedside on Friday morning.  PMT will continue to  follow.   Received message that patient's Sister Corey Olson would like to meet with PMT.  I attempted to speak with Corey Olson over the phone.  No answer.  HIPAA compliant voicemail left with PMT contact info given.  Physical Exam Vitals reviewed.  Constitutional:      General: He is not in acute distress.    Appearance: He is normal weight.  HENT:     Head: Normocephalic.     Mouth/Throat:     Mouth: Mucous membranes are moist.  Eyes:     Pupils: Pupils are equal, round, and reactive to light.  Cardiovascular:      Rate and Rhythm: Normal rate.  Pulmonary:     Effort: Pulmonary effort is normal.  Abdominal:     Palpations: Abdomen is soft.  Skin:    General: Skin is warm and dry.     Comments: Bilateral groin wound vac dressings in place  Neurological:     Mental Status: He is alert.     Comments: Oriented to self  Psychiatric:        Behavior: Behavior normal.        Judgment: Judgment normal.             Total Time 35 minutes   Corey Olson L. Manon Hilding, FNP-BC Palliative Medicine Team Team Phone # [phone]

## 2022-11-15 NOTE — Progress Notes (Signed)
Physical Therapy Treatment Patient Details Name: Corey Olson MRN: [id number] DOB: 1955/08/13 Today's Date: 11/15/2022   History of Present Illness Pt is a 67 yo male s/p bilateral femoral endarterectomy and iliac stenting. 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 PMH of COPD, gout, HLD, HTN, PVD, CVA, etoh.    PT Comments    Pt seen for a modified session due to pain limitations. Pt expressed grimacing and verbally c/o pain with minimal B LE AAROM and therefore unable to tolerate progression to EOB. Pt repositioned to Right side to promote skin integrity, all needs within reach. Will continue per POC as pt able to tolerate.   Recommendations for follow up therapy are one component of a multi-disciplinary discharge planning process, led by the attending physician.  Recommendations may be updated based on patient status, additional functional criteria and insurance authorization.  Follow Up Recommendations  Can patient physically be transported by private vehicle: No    Assistance Recommended at Discharge Frequent or constant Supervision/Assistance  Patient can return home with the following Two people to help with walking and/or transfers;Two people to help with bathing/dressing/bathroom;Assistance with cooking/housework;Assistance with feeding;Direct supervision/assist for medications management;Direct supervision/assist for financial management;Assist for transportation;Help with stairs or ramp for entrance   Equipment Recommendations  Other (comment) (TBD at next facility)    Recommendations for Other Services       Precautions / Restrictions Precautions Precautions: Fall Precaution Comments: old R hemi, weakness globally Restrictions Weight Bearing Restrictions: No Other Position/Activity Restrictions:  (B proximal thigh wound vacs intact)      Mobility  Bed Mobility Overal bed mobility: Needs Assistance Bed Mobility: Rolling Rolling: Max assist         General bed mobility comments: Pt unable to tolerate    Transfers                        Ambulation/Gait                   Stairs             Wheelchair Mobility    Modified Rankin (Stroke Patients Only)       Balance                                            Cognition Arousal/Alertness: Awake/alert Behavior During Therapy: Flat affect Overall Cognitive Status: No family/caregiver present to determine baseline cognitive functioning Area of Impairment: Orientation, Attention, Memory, Following commands, Safety/judgement, Awareness                 Orientation Level: Disoriented to, Place, Time, Situation Current Attention Level: Sustained Memory: Decreased recall of precautions, Decreased short-term memory Following Commands: Follows one step commands with increased time Safety/Judgement: Decreased awareness of safety, Decreased awareness of deficits Awareness: Intellectual   General Comments: Pt is alert but disorieneted and confused. Completely unaware of situation, location, or current limitations.recommend +2 assist for all future sessions        Exercises General Exercises - Lower Extremity Ankle Circles/Pumps: AAROM, Both, 10 reps, Supine Heel Slides: AAROM, Both, 5 reps, Supine Hip ABduction/ADduction: AAROM, Both, 5 reps, Supine    General Comments General comments (skin integrity, edema, etc.):  (Pt expressing  too much pain to tolerate further LE ROM or mobility)      Pertinent Vitals/Pain Pain Assessment Pain Assessment: Faces Faces Pain Scale: Hurts even more Pain Location: "legs" - unable to specify further without prompting Pain Descriptors / Indicators: Grimacing, Guarding, Moaning Pain Intervention(s): Limited activity within patient's tolerance, Repositioned    Home Living                           Prior Function            PT Goals (current goals can now be found in the care plan section) Acute Rehab PT Goals Patient Stated Goal: none stated    Frequency    Min 2X/week      PT Plan Current plan remains appropriate    Co-evaluation              AM-PAC PT "6 Clicks" Mobility   Outcome Measure  Help needed turning from your back to your side while in a flat bed without using bedrails?: A Lot Help needed moving from lying on your back to sitting on the side of a flat bed without using bedrails?: A Lot Help needed moving to and from a bed to a chair (including a wheelchair)?: Total Help needed standing up from a chair using your arms (e.g., wheelchair or bedside chair)?: Total Help needed to walk in hospital room?: Total Help needed climbing 3-5 steps with a railing? : Total 6 Click Score: 8    End of Session   Activity Tolerance: Patient limited by pain Patient left: in bed;with call bell/phone within reach;with bed alarm set Nurse Communication: Mobility status;Other (comment) (Pain limiting) PT Visit Diagnosis: Other abnormalities of gait and mobility (R26.89);Muscle weakness (generalized) (M62.81);Difficulty in walking, not elsewhere classified (R26.2)     Time: 1116-1140 PT Time Calculation (min) (ACUTE ONLY): 24 min  Charges:  $Therapeutic Exercise: 8-22 mins $Therapeutic Activity: 8-22 mins                    Zadie Cleverly, PTA  Jannet Askew 11/15/2022, 12:54 PM

## 2022-11-15 NOTE — Progress Notes (Signed)
  Progress Note    11/15/2022 12:45 PM 9 Days Post-Op  Subjective:  Patient is now 9 days postop from bilateral femoral endarterectomies with stent placements.  He is currently resting comfortably in bed now on the floor.  He is noted to have a prevena wound vac on both groins with some noted drainage. JP drain to left groin has been removed.  Patient has left upper extremity hand mitten on to keep from pulling his IV lines out and his dressings off.  Patient endorses pain to both groins.  No other complaints at this time.  Vitals all stable.    Vitals:   11/15/22 0337 11/15/22 0737  BP: 133/73 129/71  Pulse: 88 100  Resp: 16 18  Temp: 98.1 F (36.7 C) 98.9 F (37.2 C)  SpO2: 98% 98%   Physical Exam: Cardiac:  RRR with Tachycardia present  Lungs: Normal pulmonary effort and no respiratory distress.  No wheezing rhonchi or rales noted. Incisions: Bilateral groin Prevena wound vacs in place working well Extremities: Bilateral lower extremities have Doppler DP and PT pulses.  No edema to lower extremities noted Abdomen: Positive bowel sounds throughout.  Soft, flat, nontender and nondistended. Neurologic: Patient is alert only.  He does answer my questions this afternoon.  He does follow commands. Left foot is paralyzed due to ischemia upon arrival.  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    CBC    Component Value Date/Time   WBC 17.8 (H) 11/13/2022 0557   RBC 3.57 (L) 11/13/2022 0557   HGB 10.6 (L) 11/13/2022 0557   HCT 31.8 (L) 11/13/2022 0557   PLT 526 (H) 11/13/2022 0557   MCV 89.1 11/13/2022 0557   MCH 29.7 11/13/2022 0557   MCHC 33.3 11/13/2022 0557   RDW 14.6 11/13/2022 0557   LYMPHSABS 1.1 11/01/2022 1843   MONOABS 0.6 11/01/2022 1843   EOSABS 0.0 11/01/2022 1843   BASOSABS 0.0 11/01/2022 1843    BMET    Component Value Date/Time   NA 136 11/13/2022 0557   K 4.0 11/13/2022 0557   CL 101 11/13/2022 0557   CO2 26 11/13/2022 0557   GLUCOSE 99  11/13/2022 0557   BUN 20 11/13/2022 0557   CREATININE 1.20 11/13/2022 0557   CALCIUM 8.7 (L) 11/13/2022 0557   GFRNONAA >60 11/13/2022 0557    INR    Component Value Date/Time   INR 1.1 11/08/2022 1306     Intake/Output Summary (Last 24 hours) at 11/15/2022 1245 Last data filed at 11/15/2022 1105 Gross per 24 hour  Intake 240 ml  Output 1050 ml  Net -810 ml     Assessment/Plan:  67 y.o. male is s/p bilateral femoral endarterectomies with stent placement from 9 days ago and reexploration of the left groin for hematoma  9 Days Post-Op   PLAN: Vascular Surgery is okay with patient discharge today.  Okay to discontinue right groin Prevena Wound Vac tomorrow 11/16/2022. Left groin wound vac will need to stay in place until 21 days post op and staple removal.  Continue to avoid narcotics as this makes the patient confused and agitated. Continue Eliquis 5 mg twice daily and ASA 81 mg daily. Continue to work with PT and OT.   DVT prophylaxis:  Eliquis 5 mg   Marcie Bal [facility] 11/15/2022 12:45 PM

## 2022-11-16 DIAGNOSIS — I639 Cerebral infarction, unspecified: Secondary | ICD-10-CM | POA: Diagnosis not present

## 2022-11-16 DIAGNOSIS — Z720 Tobacco use: Secondary | ICD-10-CM | POA: Diagnosis not present

## 2022-11-16 DIAGNOSIS — I998 Other disorder of circulatory system: Secondary | ICD-10-CM | POA: Diagnosis not present

## 2022-11-16 DIAGNOSIS — M6282 Rhabdomyolysis: Secondary | ICD-10-CM | POA: Diagnosis not present

## 2022-11-16 LAB — COMPREHENSIVE METABOLIC PANEL
ALT: 108 U/L — ABNORMAL HIGH (ref 0–44)
AST: 100 U/L — ABNORMAL HIGH (ref 15–41)
Albumin: 2.6 g/dL — ABNORMAL LOW (ref 3.5–5.0)
Alkaline Phosphatase: 130 U/L — ABNORMAL HIGH (ref 38–126)
Anion gap: 11 (ref 5–15)
BUN: 21 mg/dL (ref 8–23)
CO2: 22 mmol/L (ref 22–32)
Calcium: 8.3 mg/dL — ABNORMAL LOW (ref 8.9–10.3)
Chloride: 99 mmol/L (ref 98–111)
Creatinine, Ser: 1.14 mg/dL (ref 0.61–1.24)
GFR, Estimated: >60 mL/min (ref >60)
Glucose, Bld: 103 mg/dL — ABNORMAL HIGH (ref 70–99)
Potassium: 3.6 mmol/L (ref 3.5–5.1)
Sodium: 132 mmol/L — ABNORMAL LOW (ref 135–145)
Total Bilirubin: 1.7 mg/dL — ABNORMAL HIGH (ref 0.3–1.2)
Total Protein: 7.3 g/dL (ref 6.5–8.1)

## 2022-11-16 LAB — GLUCOSE, CAPILLARY: Glucose-Capillary: 95 mg/dL (ref 70–99)

## 2022-11-16 LAB — CBC
HCT: 30.9 % — ABNORMAL LOW (ref 39.0–52.0)
Hemoglobin: 9.8 g/dL — ABNORMAL LOW (ref 13.0–17.0)
MCH: 28.1 pg (ref 26.0–34.0)
MCHC: 31.7 g/dL (ref 30.0–36.0)
MCV: 88.5 fL (ref 80.0–100.0)
Platelets: 571 10*3/uL — ABNORMAL HIGH (ref 150–400)
RBC: 3.49 MIL/uL — ABNORMAL LOW (ref 4.22–5.81)
RDW: 14.6 % (ref 11.5–15.5)
WBC: 14.2 10*3/uL — ABNORMAL HIGH (ref 4.0–10.5)
nRBC: 0 % (ref 0.0–0.2)

## 2022-11-16 NOTE — Progress Notes (Signed)
Palliative Care Progress Note, Assessment & Plan   Patient Name: Corey Olson       Date: 11/16/2022 DOB: 13-Jun-1955  Age: 67 y.o. MRN#: [id number] Attending Physician: Tresa Moore, MD Primary Care Physician: [name], MD Admit Date: 11/01/2022  Subjective: Patient is lying in bed in no apparent distress with mitts in place and wound VAC sealed and no air leaks noted.  No family or friends present during my visit.  Patient acknowledges my presence but I question his ability to make his wishes known.  He responds "all right" to my orientation and assessment questions.  HPI: 67 y.o. male  with past medical history of CVA with residual right sided weakness, COPD, alcohol use disorder, HTN admitted from home on 11/01/2022 with recurrent falls, left sided weakness and left foot dragging while ambulating.    ED findings: (-) head CT, Abnormal LFT's, metabolic acidosis, positive D-dimer and elevated CPK    Left leg found to be cool with pulses to LE unable to be palpated   6/12 underwent bilateral LE angiogram 6/14 underwent endarterectomy/stent to bilateral femoral 6/16 evacuation of left groin hematoma and repair of venous branch bleed   Ongoing encephalopathy with concern for CVA and/or ETOH withdrawal MRI 6/12 IMPRESSION: 1. Punctate subacute infarct in the right cerebellar hemisphere. No acute hemorrhage or significant mass effect. 2. 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   Palliative medicine was consulted for assisting with goals of care conversations  Summary of counseling/coordination of care: After reviewing the patient's chart and assessing the patient at bedside, I spoke with patient's sister Corey Olson over the phone.   Brief medical update given.  Corey Olson shares that she would like to have patient considered for [facility] in [place] as discharge plan.  I highlighted that patient's daughter Corey Olson is patient's next of kin and legal decision maker.  Corey Olson shares she has been speaking with the [facility] and that they are both in agreement to have patient evaluated for [place] [facility].  I conveyed that Corey Olson has not mentioned this to [facility] or myself but that I will convey the message to [facility] and request that they reach out to family in regards to this new request for [facility].  No further questions or concerns at this time.   PMT will continue to follow.   Physical Exam Constitutional:      General: He is not in acute distress.    Appearance: He is normal weight.  HENT:     Head: Normocephalic.     Mouth/Throat:     Mouth: Mucous membranes are moist.  Eyes:     Pupils: Pupils are equal, round, and reactive to light.  Pulmonary:     Effort: Pulmonary effort is normal.  Abdominal:     Palpations: Abdomen is soft.  Skin:    General: Skin is warm and dry.     Comments: Bilateral groin wound vac in place  Neurological:     Mental Status: He is alert.     Comments: Oriented to self  Psychiatric:        Mood and Affect: Mood  normal.        Judgment: Judgment normal.             Total Time 25 minutes   Corey Olson L. Manon Hilding, FNP-BC Palliative Medicine Team Team Phone # [phone]

## 2022-11-16 NOTE — Progress Notes (Signed)
PROGRESS NOTE    Corey Olson  QMV:[id number] DOB: 1955/12/28 DOA: 11/01/2022 PCP: [name], MD    Brief Narrative:  67 y.o. male with a history of stroke, peripheral atrial disease who presents with altered mental status, left leg weakness. Family spoke to him at 4:30 and felt that he was confused. Prior to that he had not been spoken to since the night before when he had seemed normal. Patient reports left leg weakness dragging his left foot while walking and decreased sensation, all of which were new but without clear timelines. 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  06/11: in ED, VSS, code stroke. Admitted to hospitalist service. Elevated AST/ALT (+)hepatic steatosis, rhabdo, (+)DVT L femoral and popliteal V.  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 Found to have weakneed pulses LLE --> ischemia/severe PAD LLE, heparin gtt, RLE and LLE angiogram. Significant vascular disease, limb likely not salvageable per Dr Wyn Quaker - prognosis poor for even AKA healing, will likely need extensive endarterectomy/stenting to even salvage an AKA. Can plan for surgery 06/13: anticipate surgery tomorrow. Cardiac clearance.  06/14: endarterectomy/stents bilateral femoral  06/15: recovering in ICU on heparin infusion. Sinus tach w/ elevated temp, (+)SIRS, (+)UA concern for sepsis d/t UTI vs SIRS w/ other medical issues and also eval for PE was neg.  06/16: 1 unit PRBC, CT LLE (+)hematoma/pseudoaneurysm, to OR w/ Dr Lenell Antu for evacuation hematoma and repair venous branch bleed.  06/17: some bleeding around drain site overnight requiring few dressing changes, 1 unit PRBC ordered, hold heparin will monitor.  06/18: stable. Heparin restarted.  06/19: Heparin --> Eliquis. Wound vacs placed to groin incisions.  6/20-6/24: Alert but minimally interactive. Vascular  following along for management of wounds.Discharge pending completion of vascular treatments.  6/25: per vascular note: Vascular Surgery is okay with patient discharge today. Okay to discontinue right groin Prevena Wound Vac tomorrow 11/16/2022. Left groin wound vac will need to stay in place until 21 days post op and staple removal. Continue to avoid narcotics as this makes the patient confused and agitated.Continue Eliquis 5 mg twice daily and ASA 81 mg daily. Continue to work with PT and OT.   Assessment & Plan:   Principal Problem:   Ischemia of extremity Active Problems:   Fall at home, initial encounter   Tobacco abuse   PAD (peripheral artery disease) (HCC)   Atherosclerosis of native arteries of extremity with intermittent claudication (HCC)   Renal artery stenosis (HCC)   Essential hypertension   Hyperlipidemia   Alcohol abuse   RUE weakness   Non-traumatic rhabdomyolysis   Positive D dimer   Abnormal EKG   Fall   Limb ischemia   Cerebrovascular accident (CVA) (HCC)   Coronary artery disease of native artery of native heart with stable angina pectoris (HCC)   Hematoma   LLE w/ severe PAD, ischemic L foot w/ neurological deficits likely cause for fall S/p angiograpy 06/12 and endarterectomy/stenting 06/14 Left leg weakness. Denies pain at site of post-op hematoma and the swelling in L groin has been stable throughout my evaluation period. He continues to deny pain. PT recommending further therapy after dc. SNF placement pending. Plan: Continue Eliquis, aspirin, atorvastatin Right groin Prevena wound dressing can come off today Left groin VAC to remain in place Medically stable for discharge.  Patient will need to be free of  mittens prior to getting to skilled nursing.  Postoperative hematoma  evacuated 06/16. Hemoglobin stable Continue to monitor   Constipation resolved. BM 6/25 - senna, miralax - suppository PRN   Multivessel CAD  HTN  HLD Previous Lexiscan  w/o ischemia Per cardiology note 02/2021 "high risk, if he develops chest pain with exertion or dyspnea, will schedule left heart cath."   DVT L femoral and popliteal V  PE ruled out  Continue Eliquis   Rhabdomyolysis w/ normal renal function  CK peaked at greater than 4400, downtrending   History CVA w/ residual R sided deficits (+)subacute R cerebellar punctate infarct on MRI He is currently on aspirin, Eliquis, and high intensity, statin, Lipitor 80 mg daily.   Transaminitis - Hepatic Steatosis on RUQ [place] EtOH use but no acute intoxication based on blood levels - question alcoholic hepatitis  Follow CMP Intensity statin   Weakness and fall Confusion / AMS question toxic metabolic encephalopathy or CVA Likely multifactorial. Pressing concern for ischemic LLE as above w/ neuro deficits predisposing to fall  Concern for CVA see MRI brain Potential EtOH withdrawal   EtOH use but no acute intoxication based on blood levels Outside withdrawal window since last known drink Thiamine replacement   Refractory hypokalemia- resolved Continue to replace as needed Monitor BMP   Essential HTN- moderately well controlled   Tobacco abuse Nicotine patch    DVT prophylaxis: Eliquis Code Status: Full Family Communication: None Disposition Plan: Status is: Inpatient Remains inpatient appropriate because: Multiple acute issues as above   Level of care: Progressive  Consultants:  Vascular surgery Palliative care  Procedures:  Multiple  Antimicrobials: None   Subjective: Seen and examined.  Resting in bed.  No visible distress  Objective: Vitals:   11/16/22 0007 11/16/22 0350 11/16/22 0756 11/16/22 1138  BP: (!) 113/56 106/86 119/74 102/61  Pulse: 100 (!) 104 (!) 105 100  Resp: 18 18 16 16   Temp: 97.8 F (36.6 C) 98.1 F (36.7 C) 98.4 F (36.9 C) 98.3 F (36.8 C)  TempSrc:      SpO2: 98% 98% 100% 100%  Weight:      Height:        Intake/Output Summary (Last  24 hours) at 11/16/2022 1431 Last data filed at 11/16/2022 0758 Gross per 24 hour  Intake 300 ml  Output 300 ml  Net 0 ml   Filed Weights   11/08/22 0500 11/13/22 0500 11/14/22 0500  Weight: 76.1 kg 71.7 kg 68.7 kg    Examination:  General exam: NAD.  Appears chronically ill Respiratory system: Clear to auscultation. Respiratory effort normal. Cardiovascular system: S1-2, RRR, no murmurs, no pedal edema Gastrointestinal system: Soft, NT/ND, normal bowel sounds Central nervous system: Alert and oriented. No focal neurological deficits. Extremities: Wound VAC x 2 groin Skin: No rashes, lesions or ulcers Psychiatry: Judgement and insight appear impaired. Mood & affect flattened.     Data Reviewed: I have personally reviewed following labs and imaging studies  CBC: Recent Labs  Lab 11/10/22 0444 11/11/22 0550 11/13/22 0557 11/16/22 0446  WBC 14.8* 15.1* 17.8* 14.2*  HGB 9.4* 10.2* 10.6* 9.8*  HCT 28.3* 30.8* 31.8* 30.9*  MCV 86.8 88.0 89.1 88.5  PLT 293 347 526* 571*   Basic Metabolic Panel: Recent Labs  Lab 11/10/22 0444 11/11/22 0550 11/13/22 0557 11/16/22 0446  NA 135 135 136 132*  K 3.3* 3.3* 4.0 3.6  CL 101 100 101 99  CO2 25 24 26 22   GLUCOSE 95 109* 99 103*  BUN 13 14 20 21   CREATININE 1.06 1.04 1.20 1.14  CALCIUM 8.1* 7.9* 8.7* 8.3*  MG  --  1.9  --   --   PHOS  --  3.6  --   --    GFR: Estimated Creatinine Clearance: 58.8 mL/min (by C-G formula based on SCr of 1.14 mg/dL). Liver Function Tests: Recent Labs  Lab 11/13/22 0557 11/16/22 0446  AST 93* 100*  ALT 111* 108*  ALKPHOS 101 130*  BILITOT 1.8* 1.7*  PROT 7.4 7.3  ALBUMIN 2.6* 2.6*   No results for input(s): "LIPASE", "AMYLASE" in the last 168 hours. No results for input(s): "AMMONIA" in the last 168 hours. Coagulation Profile: No results for input(s): "INR", "PROTIME" in the last 168 hours. Cardiac Enzymes: No results for input(s): "CKTOTAL", "CKMB", "CKMBINDEX", "TROPONINI" in the  last 168 hours. BNP (last 3 results) No results for input(s): "PROBNP" in the last 8760 hours. HbA1C: No results for input(s): "HGBA1C" in the last 72 hours. CBG: No results for input(s): "GLUCAP" in the last 168 hours. Lipid Profile: No results for input(s): "CHOL", "HDL", "LDLCALC", "TRIG", "CHOLHDL", "LDLDIRECT" in the last 72 hours. Thyroid Function Tests: No results for input(s): "TSH", "T4TOTAL", "FREET4", "T3FREE", "THYROIDAB" in the last 72 hours. Anemia Panel: No results for input(s): "VITAMINB12", "FOLATE", "FERRITIN", "TIBC", "IRON", "RETICCTPCT" in the last 72 hours. Sepsis Labs: No results for input(s): "PROCALCITON", "LATICACIDVEN" in the last 168 hours.  No results found for this or any previous visit (from the past 240 hour(s)).       Radiology Studies: No results found.      Scheduled Meds:  allopurinol  300 mg Oral Daily   apixaban  5 mg Oral BID   aspirin EC  81 mg Oral Daily   atorvastatin  80 mg Oral QPM   lisinopril  20 mg Oral Daily   metoprolol succinate  50 mg Oral Daily   nicotine  21 mg Transdermal Daily   pantoprazole  40 mg Oral BID   polyethylene glycol  17 g Oral BID   senna  1 tablet Oral Daily   sodium chloride flush  3 mL Intravenous Q12H   thiamine  100 mg Oral Daily   Continuous Infusions:   LOS: 14 days     Tresa Moore, MD [facility]   If 7PM-7AM, please contact night-coverage  11/16/2022, 2:31 PM

## 2022-11-16 NOTE — Plan of Care (Signed)
Per Hospitalist verbal - indicated that Vascular anticipated removing RT prevena today.  So gave verbal to remove and cover as needed.  RT WV removed and found staples.  No s/sx of infection or drainage.  No open wound.  Foam placed.  Also asked to DC mitts, to assist with plans to DC to SNF.

## 2022-11-16 NOTE — TOC Progression Note (Signed)
Transition of Care [facility]) - Progression Note    Patient Details  Name: Corey Olson MRN: [id number] Date of Birth: 26-Jan-1956  Transition of Care [facility]) CM/SW Contact  Margarito Liner, LCSW Phone Number: 11/16/2022, 4:04 PM  Clinical Narrative: Palliative said sister asked about [facility] in [place]. After looking it up online, this appears to be an ALF. CSW left voicemail for daughter to notify.    Expected Discharge Plan and Services                                               Social Determinants of Health (SDOH) Interventions SDOH Screenings   Food Insecurity: No Food Insecurity (11/15/2022)  Housing: Low Risk  (11/02/2022)  Transportation Needs: No Transportation Needs (11/15/2022)  Utilities: Not At Risk (11/15/2022)  Tobacco Use: High Risk (11/09/2022)    Readmission Risk Interventions     No data to display

## 2022-11-17 DIAGNOSIS — I998 Other disorder of circulatory system: Secondary | ICD-10-CM | POA: Diagnosis not present

## 2022-11-17 DIAGNOSIS — Z515 Encounter for palliative care: Secondary | ICD-10-CM | POA: Diagnosis not present

## 2022-11-17 DIAGNOSIS — M6282 Rhabdomyolysis: Secondary | ICD-10-CM | POA: Diagnosis not present

## 2022-11-17 DIAGNOSIS — I639 Cerebral infarction, unspecified: Secondary | ICD-10-CM | POA: Diagnosis not present

## 2022-11-17 MED ORDER — HALOPERIDOL LACTATE 5 MG/ML IJ SOLN: 2.0000 mg | Freq: Four times a day (QID) | INTRAMUSCULAR | Status: DC | PRN

## 2022-11-17 MED ORDER — HALOPERIDOL 0.5 MG PO TABS
2.0000 mg | ORAL_TABLET | Freq: Three times a day (TID) | ORAL | Status: DC | PRN
  Filled 2022-11-17: qty 1

## 2022-11-17 MED ORDER — QUETIAPINE FUMARATE 25 MG PO TABS
25.0000 mg | ORAL_TABLET | Freq: Every day | ORAL | Status: DC
  Administered 2022-11-17 – 2022-11-21 (×5): 25 mg via ORAL
  Filled 2022-11-17 (×5): qty 1

## 2022-11-17 NOTE — Progress Notes (Signed)
Palliative Care Progress Note, Assessment & Plan   Patient Name: Corey Olson       Date: 11/17/2022 DOB: May 22, 1956  Age: 67 y.o. MRN#: [id number] Attending Physician: Tresa Moore, MD Primary Care Physician: [name], MD Admit Date: 11/01/2022  Subjective: Patient is sitting up in bed with mitts off.  He acknowledges my presence and is able to make his wishes known.  He endorses he has had a bowel movement and needs to be cleaned up.  Nurse and nurse tech notified.  No family or friends present during my visit.  HPI: 67 y.o. male  with past medical history of CVA with residual right sided weakness, COPD, alcohol use disorder, HTN admitted from home on 11/01/2022 with recurrent falls, left sided weakness and left foot dragging while ambulating.    ED findings: (-) head CT, Abnormal LFT's, metabolic acidosis, positive D-dimer and elevated CPK    Left leg found to be cool with pulses to LE unable to be palpated   6/12 underwent bilateral LE angiogram 6/14 underwent endarterectomy/stent to bilateral femoral 6/16 evacuation of left groin hematoma and repair of venous branch bleed   Ongoing encephalopathy with concern for CVA and/or ETOH withdrawal MRI 6/12 IMPRESSION: 1. Punctate subacute infarct in the right cerebellar hemisphere. No acute hemorrhage or significant mass effect. 2. 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   Palliative medicine was consulted for assisting with goals of care conversations.  Summary of counseling/coordination of care: After reviewing the patient's chart and assessing the patient at bedside, I spoke with patient in regards to symptom management.    Symptoms assessed.  He denies headache, chest  pain, N/V/D, or other acute physical complaints at this time.  He says he is "okay" and is "feeling fine".  No adjustments to [facility] medications needed at this time.  Full code and full scope remain.  No other acute palliative needs at this time.  TOC following closely and in communication with daughter in regards to discharge planning.  PMT will continue to follow and support patient and family throughout his hospitalization.  Physical Exam Vitals reviewed.  Constitutional:      General: He is not in acute distress.    Appearance: He is normal weight.  HENT:     Head: Normocephalic.  Eyes:     Pupils: Pupils are equal, round, and reactive to light.  Cardiovascular:     Rate and Rhythm: Normal rate.     Pulses: Normal pulses.  Pulmonary:     Effort: Pulmonary effort is normal.  Abdominal:     Palpations: Abdomen is soft.  Musculoskeletal:     Comments: Generalized weakness  Skin:    General: Skin is warm and dry.     Comments: Bilateral incisions of groin - UTA  Neurological:     Mental Status: He is alert.     Comments: Oriented to self  Psychiatric:        Mood and Affect: Mood normal.        Behavior: Behavior normal.        Judgment: Judgment normal.  Total Time 25 minutes   Keasia Dubose L. Ilsa Iha, FNP-BC Palliative Medicine Team Team Phone # [phone]

## 2022-11-17 NOTE — TOC Progression Note (Addendum)
Transition of Care [facility]) - Progression Note    Patient Details  Name: Corey Olson MRN: [id number] Date of Birth: [birth year]  Transition of Care [facility]) CM/SW Contact  Margarito Liner, LCSW Phone Number: 11/17/2022, 11:42 AM  Clinical Narrative: Daughter wants to check [facility], [facility], and [facility] SNF. Sent referral to [place] on the hub. Will call the other two admissions coordinators to get fax number. Encouraged her to pick preference out of the facilities that have already offered in case none of the others can take him.  4:11 pm: Faxed referrals to [facility] SNF and [facility] and [facility]. Per vascular, patient not longer has wound vac.  Expected Discharge Plan and Services                                               Social Determinants of Health (SDOH) Interventions SDOH Screenings   Food Insecurity: No Food Insecurity (11/15/2022)  Housing: Low Risk  (11/02/2022)  Transportation Needs: No Transportation Needs (11/15/2022)  Utilities: Not At Risk (11/15/2022)  Tobacco Use: High Risk (11/09/2022)    Readmission Risk Interventions     No data to display

## 2022-11-17 NOTE — Progress Notes (Signed)
PROGRESS NOTE    Corey Olson  NWG:[id number] DOB: 1956/02/27 DOA: 11/01/2022 PCP: [name], MD    Brief Narrative:  67 y.o. male with a history of stroke, peripheral atrial disease who presents with altered mental status, left leg weakness. Family spoke to him at 4:30 and felt that he was confused. Prior to that he had not been spoken to since the night before when he had seemed normal. Patient reports left leg weakness dragging his left foot while walking and decreased sensation, all of which were new but without clear timelines. 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  06/11: in ED, VSS, code stroke. Admitted to hospitalist service. Elevated AST/ALT (+)hepatic steatosis, rhabdo, (+)DVT L femoral and popliteal V.  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 Found to have weakneed pulses LLE --> ischemia/severe PAD LLE, heparin gtt, RLE and LLE angiogram. Significant vascular disease, limb likely not salvageable per Dr Wyn Quaker - prognosis poor for even AKA healing, will likely need extensive endarterectomy/stenting to even salvage an AKA. Can plan for surgery 06/13: anticipate surgery tomorrow. Cardiac clearance.  06/14: endarterectomy/stents bilateral femoral  06/15: recovering in ICU on heparin infusion. Sinus tach w/ elevated temp, (+)SIRS, (+)UA concern for sepsis d/t UTI vs SIRS w/ other medical issues and also eval for PE was neg.  06/16: 1 unit PRBC, CT LLE (+)hematoma/pseudoaneurysm, to OR w/ Dr Lenell Antu for evacuation hematoma and repair venous branch bleed.  06/17: some bleeding around drain site overnight requiring few dressing changes, 1 unit PRBC ordered, hold heparin will monitor.  06/18: stable. Heparin restarted.  06/19: Heparin --> Eliquis. Wound vacs placed to groin incisions.  6/20-6/24: Alert but minimally interactive. Vascular  following along for management of wounds.Discharge pending completion of vascular treatments.  6/25: per vascular note: Vascular Surgery is okay with patient discharge today. Okay to discontinue right groin Prevena Wound Vac tomorrow  11/16/2022. Left groin wound vac will need to stay in place until 21 days post op and staple removal. Continue to avoid narcotics as this makes the patient confused and agitated.Continue Eliquis 5 mg twice daily and ASA 81 mg daily. Continue to work with PT and OT.  6/27: Patient discontinued wound VAC.  Dry gauze dressing recommended per vascular surgery.  Assessment & Plan:   Principal Problem:   Ischemia of extremity Active Problems:   Fall at home, initial encounter   Tobacco abuse   PAD (peripheral artery disease) (HCC)   Atherosclerosis of native arteries of extremity with intermittent claudication (HCC)   Renal artery stenosis (HCC)   Essential hypertension   Hyperlipidemia   Alcohol abuse   RUE weakness   Non-traumatic rhabdomyolysis   Positive D dimer   Abnormal EKG   Fall   Limb ischemia   Cerebrovascular accident (CVA) (HCC)   Coronary artery disease of native artery of native heart with stable angina pectoris (HCC)   Hematoma   LLE w/ severe PAD, ischemic L foot w/ neurological deficits likely cause for fall S/p angiograpy 06/12 and endarterectomy/stenting 06/14 Left leg weakness. Denies pain at site of post-op hematoma and the swelling in L groin has been stable throughout my evaluation period. He continues to deny pain. PT recommending further therapy after dc. SNF placement pending. Right groin Prevena wound dressing discontinued by RN 6/26.  Patient self discontinued left groin dressing on 6/27 Plan:  Continue Eliquis, aspirin, atorvastatin Dry gauze placed on left groin wound Will attempt to chemically restrain with use of atypical antipsychotics Medically stable for discharge.  Patient will need to be free of mittens prior to  getting to skilled nursing.  Postoperative hematoma  evacuated 06/16. Hemoglobin stable Continue to monitor   Constipation resolved. BM 6/25 - senna, miralax - suppository PRN   Multivessel CAD  HTN  HLD Previous Lexiscan w/o ischemia Outpatient cardiology follow-up   DVT L femoral and popliteal V  PE ruled out  Continue Eliquis   Rhabdomyolysis w/ normal renal function  CK peaked at greater than 4400, downtrending   History CVA w/ residual R sided deficits (+)subacute R cerebellar punctate infarct on MRI He is currently on aspirin, Eliquis, and high intensity, statin, Lipitor 80 mg daily.   Transaminitis - Hepatic Steatosis on RUQ [place] EtOH use but no acute intoxication based on blood levels - question alcoholic hepatitis  Follow CMP Intensity statin   Weakness and fall Confusion / AMS question toxic metabolic encephalopathy or CVA Likely multifactorial. Pressing concern for ischemic LLE as above w/ neuro deficits predisposing to fall  Concern for CVA see MRI brain Potential EtOH withdrawal   EtOH use but no acute intoxication based on blood levels Outside withdrawal window since last known drink Thiamine replacement   Refractory hypokalemia- resolved Continue to replace as needed Monitor BMP   Essential HTN- moderately well controlled   Tobacco abuse Nicotine patch    DVT prophylaxis: Eliquis Code Status: Full Family Communication: None Disposition Plan: Status is: Inpatient Remains inpatient appropriate because: Multiple acute issues as above   Level of care: Progressive  Consultants:  Vascular surgery Palliative care  Procedures:  Multiple  Antimicrobials: None   Subjective: Seen and examined.  Resting in bed.  No visible distress.  Not agitated  Objective: Vitals:   11/16/22 2334 11/17/22 0419 11/17/22 0542 11/17/22 0800  BP: 119/67 129/66 130/76 116/67  Pulse: 100  97 (!) 101  Resp: 18 20 18 16   Temp: 98.3 F (36.8 C) 99.2 F  (37.3 C) 98.3 F (36.8 C) 98.5 F (36.9 C)  TempSrc: Oral Oral Oral Oral  SpO2: 96% 97% 99% 92%  Weight:      Height:        Intake/Output Summary (Last 24 hours) at 11/17/2022 1209 Last data filed at 11/17/2022 1005 Gross per 24 hour  Intake 120 ml  Output --  Net 120 ml   Filed Weights   11/08/22 0500 11/13/22 0500 11/14/22 0500  Weight: 76.1 kg 71.7 kg 68.7 kg    Examination:  General exam: No acute distress.  Appears chronically ill Respiratory system: Clear to auscultation. Respiratory effort normal. Cardiovascular system: S1-2, RRR, no murmurs, no pedal edema Gastrointestinal system: Soft, NT/ND, normal bowel sounds Central nervous system: Alert and oriented. No focal neurological deficits. Extremities: Groin wound VAC discontinued.  Dry dressing in place Skin: No rashes, lesions or ulcers Psychiatry: Judgement and insight appear impaired. Mood & affect flattened.     Data Reviewed: I have personally reviewed following labs and imaging studies  CBC: Recent Labs  Lab 11/11/22 0550 11/13/22 0557 11/16/22 0446  WBC 15.1* 17.8* 14.2*  HGB 10.2* 10.6* 9.8*  HCT 30.8* 31.8* 30.9*  MCV 88.0 89.1 88.5  PLT 347 526* 571*   Basic Metabolic Panel: Recent Labs  Lab 11/11/22 0550 11/13/22 0557 11/16/22 0446  NA 135 136 132*  K 3.3* 4.0 3.6  CL 100 101 99  CO2  24 26 22   GLUCOSE 109* 99 103*  BUN 14 20 21   CREATININE 1.04 1.20 1.14  CALCIUM 7.9* 8.7* 8.3*  MG 1.9  --   --   PHOS 3.6  --   --    GFR: Estimated Creatinine Clearance: 58.8 mL/min (by C-G formula based on SCr of 1.14 mg/dL). Liver Function Tests: Recent Labs  Lab 11/13/22 0557 11/16/22 0446  AST 93* 100*  ALT 111* 108*  ALKPHOS 101 130*  BILITOT 1.8* 1.7*  PROT 7.4 7.3  ALBUMIN 2.6* 2.6*   No results for input(s): "LIPASE", "AMYLASE" in the last 168 hours. No results for input(s): "AMMONIA" in the last 168 hours. Coagulation Profile: No results for input(s): "INR", "PROTIME" in the  last 168 hours. Cardiac Enzymes: No results for input(s): "CKTOTAL", "CKMB", "CKMBINDEX", "TROPONINI" in the last 168 hours. BNP (last 3 results) No results for input(s): "PROBNP" in the last 8760 hours. HbA1C: No results for input(s): "HGBA1C" in the last 72 hours. CBG: Recent Labs  Lab 11/16/22 2114  GLUCAP 95   Lipid Profile: No results for input(s): "CHOL", "HDL", "LDLCALC", "TRIG", "CHOLHDL", "LDLDIRECT" in the last 72 hours. Thyroid Function Tests: No results for input(s): "TSH", "T4TOTAL", "FREET4", "T3FREE", "THYROIDAB" in the last 72 hours. Anemia Panel: No results for input(s): "VITAMINB12", "FOLATE", "FERRITIN", "TIBC", "IRON", "RETICCTPCT" in the last 72 hours. Sepsis Labs: No results for input(s): "PROCALCITON", "LATICACIDVEN" in the last 168 hours.  No results found for this or any previous visit (from the past 240 hour(s)).       Radiology Studies: No results found.      Scheduled Meds:  allopurinol  300 mg Oral Daily   apixaban  5 mg Oral BID   aspirin EC  81 mg Oral Daily   atorvastatin  80 mg Oral QPM   lisinopril  20 mg Oral Daily   metoprolol succinate  50 mg Oral Daily   nicotine  21 mg Transdermal Daily   pantoprazole  40 mg Oral BID   polyethylene glycol  17 g Oral BID   QUEtiapine  25 mg Oral QHS   senna  1 tablet Oral Daily   sodium chloride flush  3 mL Intravenous Q12H   thiamine  100 mg Oral Daily   Continuous Infusions:   LOS: 15 days     Tresa Moore, MD [facility]   If 7PM-7AM, please contact night-coverage  11/17/2022, 12:09 PM

## 2022-11-18 DIAGNOSIS — I998 Other disorder of circulatory system: Secondary | ICD-10-CM | POA: Diagnosis not present

## 2022-11-18 NOTE — Progress Notes (Signed)
                                                                                                                                                                                                           Daily Progress Note   Patient Name: Corey Olson       Date: 11/18/2022 DOB: May 22, 1956  Age: 67 y.o. MRN#: [id number] Attending Physician: Tresa Moore, MD Primary Care Physician: [name], MD Admit Date: 11/01/2022  Reason for Consultation/Follow-up: Establishing goals of care  HPI/Brief Hospital Review: 67 y.o. male  with past medical history of CVA with residual right sided weakness, COPD, alcohol use disorder, HTN admitted from home on 11/01/2022 with recurrent falls, left sided weakness and left foot dragging while ambulating.    ED findings: (-) head CT, Abnormal LFT's, metabolic acidosis, positive D-dimer and elevated CPK    Left leg found to be cool with pulses to LE unable to be palpated   6/12 underwent bilateral LE angiogram 6/14 underwent endarterectomy/stent to bilateral femoral 6/16 evacuation of left groin hematoma and repair of venous branch bleed   Ongoing encephalopathy with concern for CVA and/or ETOH withdrawal MRI 6/12 IMPRESSION: 1. Punctate subacute infarct in the right cerebellar hemisphere. No acute hemorrhage or significant mass effect. 2. 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   Palliative medicine was consulted for assisting with goals of care conversations.  Subjective: Extensive chart review has been completed prior to meeting patient including labs, vital signs, imaging, progress notes, orders, and available advanced directive documents from current and previous encounters.    Visited with Corey Olson at his bedside. Awake and alert, answers questions with simple "yes or no." Unable to elaborate on his understanding of current situation. Denies acute pain or discomfort. No family  at bedside during time of visit.  TOC discussing with family regarding discharge disposition. PMT will follow peripherally for needs as they arise.  Thank you for allowing the Palliative Medicine Team to assist in the care of this patient.  Total time:  25 minutes  Time spent includes: Detailed review of medical records (labs, imaging, vital signs), medically appropriate exam (mental status, respiratory, cardiac, skin), discussed with treatment team, counseling and educating patient, family and staff, documenting clinical information, medication management and coordination of care.  Leeanne Deed, DNP, AGNP-C Palliative Medicine   Please contact Palliative Medicine Team phone at [phone] for questions and concerns.

## 2022-11-18 NOTE — TOC Progression Note (Addendum)
Transition of Care [facility]) - Progression Note    Patient Details  Name: Corey Olson MRN: [id number] Date of Birth: [birth year]  [facility]) CM/SW Contact  Margarito Liner, LCSW Phone Number: 11/18/2022, 10:31 AM  Clinical Narrative:  [facility] a bed. [facility] reviewing [facility]. CSW left daughter a voicemail to see if she would like to accept once of the facilities that has already offered.  11:10 am: Faxed updated documentation to [facility].  1:04 pm: [facility] is undecided about bed offer. Called daughter and she accepted bed offer from [facility]. Left voicemail for admissions coordinator to notify.  Expected Discharge Plan and Services                                               Social Determinants of Health (SDOH) Interventions SDOH Screenings   Food Insecurity: No Food Insecurity (11/15/2022)  Housing: Low Risk  (11/02/2022)  Transportation Needs: No Transportation Needs (11/15/2022)  Utilities: Not At Risk (11/15/2022)  Tobacco Use: High Risk (11/09/2022)    Readmission Risk Interventions     No data to display

## 2022-11-18 NOTE — Progress Notes (Signed)
Physical Therapy Treatment Patient Details Name: Corey Olson MRN: [id number] DOB: Dec 12, 1955 Today's Date: 11/18/2022   History of Present Illness Pt is a 67 yo male s/p bilateral femoral endarterectomy and iliac stenting. 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 PMH of COPD, gout, HLD, HTN, PVD, CVA, etoh.    PT Comments    Patient is agreeable to PT. He continues to require assistance for bed mobility. Sitting balance is improved this session. Patient was able to stand x 1 bout with assistance with poor standing tolerance and poor standing balance. Recommend to continue PT to maximize independence and facilitate return to prior level of function.    Recommendations for follow up therapy are one component of a multi-disciplinary discharge planning process, led by the attending physician.  Recommendations may be updated based on patient status, additional functional criteria and insurance authorization.  Follow Up Recommendations  Can patient physically be transported by private vehicle: No    Assistance Recommended at Discharge Frequent or constant Supervision/Assistance  Patient can return home with the following Two people to help with walking and/or transfers;Two people to help with bathing/dressing/bathroom;Assistance with cooking/housework;Assistance with feeding;Direct supervision/assist for medications management;Direct supervision/assist for financial management;Assist for transportation;Help with stairs or ramp for entrance   Equipment Recommendations  Other (comment) (to be determined at next level of care)    Recommendations for Other Services       Precautions / Restrictions Precautions Precautions: Fall Precaution Comments: old R hemi, weakness globally     Mobility  Bed Mobility Overal bed mobility: Needs Assistance Bed Mobility:  Supine to Sit, Sit to Supine     Supine to sit: Mod assist, HOB elevated Sit to supine: Max assist   General bed mobility comments: assistance for RLE and trunk support to sit upright. increased assistance required to return to bed. verbal cues for technique    Transfers Overall transfer level: Needs assistance Equipment used: Rolling walker (2 wheels) Transfers: Sit to/from Stand Sit to Stand: Max assist           General transfer comment: bed height elevated. patient able to stand x 1 bouts with faciliation for hip extension on the right and cues for technique. patient used rolling walker for support for LUE (unable to use with RUE due to old stroke symptoms)    Ambulation/Gait               General Gait Details: unable to at this time   Social research officer, government Rankin (Stroke Patients Only)       Balance Overall balance assessment: Needs assistance Sitting-balance support: Feet supported Sitting balance-Leahy Scale: Poor     Standing balance support: Single extremity supported Standing balance-Leahy Scale: Poor Standing balance comment: external support required from therapist                            Cognition Arousal/Alertness: Awake/alert Behavior During Therapy: Flat affect Overall Cognitive Status: No family/caregiver present to determine baseline cognitive functioning                                 General Comments: slow processing  Exercises      General Comments        Pertinent Vitals/Pain Pain Assessment Pain Assessment: No/denies pain    Home Living                          Prior Function            PT Goals (current goals can now be found in the care plan section) Acute Rehab PT Goals Patient Stated Goal: none stated PT Goal Formulation: Patient unable to participate in goal setting Time For Goal Achievement: 11/19/22 Potential to Achieve  Goals: Fair Progress towards PT goals: Progressing toward goals    Frequency    Min 2X/week      PT Plan Current plan remains appropriate    Co-evaluation              AM-PAC PT "6 Clicks" Mobility   Outcome Measure  Help needed turning from your back to your side while in a flat bed without using bedrails?: A Lot Help needed moving from lying on your back to sitting on the side of a flat bed without using bedrails?: A Lot Help needed moving to and from a bed to a chair (including a wheelchair)?: Total Help needed standing up from a chair using your arms (e.g., wheelchair or bedside chair)?: Total Help needed to walk in hospital room?: Total Help needed climbing 3-5 steps with a railing? : Total 6 Click Score: 8    End of Session   Activity Tolerance: Patient limited by fatigue Patient left: in bed;with call bell/phone within reach;with bed alarm set Nurse Communication: Mobility status PT Visit Diagnosis: Other abnormalities of gait and mobility (R26.89);Muscle weakness (generalized) (M62.81);Difficulty in walking, not elsewhere classified (R26.2)     Time: 1610-9604 PT Time Calculation (min) (ACUTE ONLY): 14 min  Charges:  $Therapeutic Activity: 8-22 mins                     Donna Bernard, PT, MPT    Ina Homes 11/18/2022, 12:15 PM

## 2022-11-18 NOTE — Care Management Important Message (Signed)
Important Message  Patient Details  Name: Corey Olson MRN: [id number] Date of Birth: [birth year]   Medicare Important Message Given:  Yes     [name] 11/18/2022, 10:15 AM

## 2022-11-18 NOTE — TOC Progression Note (Addendum)
Transition of Care [facility]) - Progression Note    Patient Details  Name: Corey Olson MRN: [id number] Date of Birth: 1956-04-04  [facility]) CM/SW Contact  Darleene Cleaver, [place] Phone Number: 11/18/2022, 6:20pm  Clinical Narrative:    CSW received phone call from [facility] admissions director from [facility].  Per Georgiann Mccoy, they can not take over weekend, but can take on Monday.  Per Georgiann Mccoy she said to have assigned TOC call back on Monday to facilitate discharge planning [phone].     Expected Discharge Plan and Services    SNF                                           Social Determinants of Health (SDOH) Interventions SDOH Screenings   Food Insecurity: No Food Insecurity (11/15/2022)  Housing: Low Risk  (11/02/2022)  Transportation Needs: No Transportation Needs (11/15/2022)  Utilities: Not At Risk (11/15/2022)  Tobacco Use: High Risk (11/09/2022)    Readmission Risk Interventions     No data to display

## 2022-11-18 NOTE — Progress Notes (Signed)
PROGRESS NOTE    Corey Olson  NWG:[id number] DOB: 1956/02/27 DOA: 11/01/2022 PCP: [name], MD    Brief Narrative:  67 y.o. male with a history of stroke, peripheral atrial disease who presents with altered mental status, left leg weakness. Family spoke to him at 4:30 and felt that he was confused. Prior to that he had not been spoken to since the night before when he had seemed normal. Patient reports left leg weakness dragging his left foot while walking and decreased sensation, all of which were new but without clear timelines. 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  06/11: in ED, VSS, code stroke. Admitted to hospitalist service. Elevated AST/ALT (+)hepatic steatosis, rhabdo, (+)DVT L femoral and popliteal V.  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 Found to have weakneed pulses LLE --> ischemia/severe PAD LLE, heparin gtt, RLE and LLE angiogram. Significant vascular disease, limb likely not salvageable per Dr Wyn Quaker - prognosis poor for even AKA healing, will likely need extensive endarterectomy/stenting to even salvage an AKA. Can plan for surgery 06/13: anticipate surgery tomorrow. Cardiac clearance.  06/14: endarterectomy/stents bilateral femoral  06/15: recovering in ICU on heparin infusion. Sinus tach w/ elevated temp, (+)SIRS, (+)UA concern for sepsis d/t UTI vs SIRS w/ other medical issues and also eval for PE was neg.  06/16: 1 unit PRBC, CT LLE (+)hematoma/pseudoaneurysm, to OR w/ Dr Lenell Antu for evacuation hematoma and repair venous branch bleed.  06/17: some bleeding around drain site overnight requiring few dressing changes, 1 unit PRBC ordered, hold heparin will monitor.  06/18: stable. Heparin restarted.  06/19: Heparin --> Eliquis. Wound vacs placed to groin incisions.  6/20-6/24: Alert but minimally interactive. Vascular  following along for management of wounds.Discharge pending completion of vascular treatments.  6/25: per vascular note: Vascular Surgery is okay with patient discharge today. Okay to discontinue right groin Prevena Wound Vac tomorrow  11/16/2022. Left groin wound vac will need to stay in place until 21 days post op and staple removal. Continue to avoid narcotics as this makes the patient confused and agitated.Continue Eliquis 5 mg twice daily and ASA 81 mg daily. Continue to work with PT and OT.  6/27: Patient discontinued wound VAC.  Dry gauze dressing recommended per vascular surgery.  Assessment & Plan:   Principal Problem:   Ischemia of extremity Active Problems:   Fall at home, initial encounter   Tobacco abuse   PAD (peripheral artery disease) (HCC)   Atherosclerosis of native arteries of extremity with intermittent claudication (HCC)   Renal artery stenosis (HCC)   Essential hypertension   Hyperlipidemia   Alcohol abuse   RUE weakness   Non-traumatic rhabdomyolysis   Positive D dimer   Abnormal EKG   Fall   Limb ischemia   Cerebrovascular accident (CVA) (HCC)   Coronary artery disease of native artery of native heart with stable angina pectoris (HCC)   Hematoma   LLE w/ severe PAD, ischemic L foot w/ neurological deficits likely cause for fall S/p angiograpy 06/12 and endarterectomy/stenting 06/14 Left leg weakness. Denies pain at site of post-op hematoma and the swelling in L groin has been stable throughout my evaluation period. He continues to deny pain. PT recommending further therapy after dc. SNF placement pending. Right groin Prevena wound dressing discontinued by RN 6/26.  Patient self discontinued left groin dressing on 6/27 Plan:  Continue Eliquis, aspirin, atorvastatin Dry gauze placed on left groin wound Continue nightly Seroquel Medically stable for discharge.   Patient will need to be free of mittens prior to getting to skilled nursing.  Postoperative  hematoma  evacuated 06/16. Hemoglobin stable Continue to monitor   Constipation resolved. BM 6/25 - senna, miralax - suppository PRN   Multivessel CAD  HTN  HLD Previous Lexiscan w/o ischemia Outpatient cardiology follow-up   DVT L femoral and popliteal V  PE ruled out  Continue Eliquis   Rhabdomyolysis w/ normal renal function  CK peaked at greater than 4400, downtrending   History CVA w/ residual R sided deficits (+)subacute R cerebellar punctate infarct on MRI He is currently on aspirin, Eliquis, and high intensity, statin, Lipitor 80 mg daily.   Transaminitis - Hepatic Steatosis on RUQ [place] EtOH use but no acute intoxication based on blood levels -  question alcoholic hepatitis  Follow CMP    Weakness and fall Confusion / AMS question toxic metabolic encephalopathy or CVA Likely multifactorial. Pressing concern for ischemic LLE as above w/ neuro deficits predisposing to fall  Concern for CVA see MRI brain Potential EtOH withdrawal   EtOH use but no acute intoxication based on blood levels Outside withdrawal window since last known drink Thiamine replacement   Refractory hypokalemia- resolved Continue to replace as needed Monitor BMP   Essential HTN- moderately well controlled   Tobacco abuse Nicotine patch    DVT prophylaxis: Eliquis Code Status: Full Family Communication: None Disposition Plan: Status is: Inpatient Remains inpatient appropriate because: Multiple acute issues as above   Level of care: Progressive  Consultants:  Vascular surgery Palliative care  Procedures:  Multiple  Antimicrobials: None   Subjective: Seen and examined.  Resting in bed.  No visible distress.  Not agitated  Objective: Vitals:   11/17/22 2315 11/18/22 0431 11/18/22 0824 11/18/22 1110  BP: 135/67 109/60 116/67 (!) 83/55  Pulse: (!) 57 100 94 96  Resp: 18 19 18 18   Temp: 98.7 F (37.1 C) 97.8 F (36.6 C) (!) 97.5 F (36.4 C) (!) 97.4 F (36.3 C)   TempSrc:      SpO2: 98% 100% 97% 98%  Weight:      Height:       No intake or output data in the 24 hours ending 11/18/22 1236  Filed Weights   11/08/22 0500 11/13/22 0500 11/14/22 0500  Weight: 76.1 kg 71.7 kg 68.7 kg    Examination:  General exam: NAD.  Frail-appearing Respiratory system: Clear to auscultation. Respiratory effort normal. Cardiovascular system: S1-2, RRR, no murmurs, no pedal edema Gastrointestinal system: Soft, NT/ND, normal bowel sounds Central nervous system: Alert and oriented. No focal neurological deficits. Extremities: Bilateral groin dressings in place Skin: No rashes, lesions or ulcers Psychiatry: Judgement and insight appear impaired. Mood & affect flattened.     Data Reviewed: I have personally reviewed following labs and imaging studies  CBC: Recent Labs  Lab 11/13/22 0557 11/16/22 0446  WBC 17.8* 14.2*  HGB 10.6* 9.8*  HCT 31.8* 30.9*  MCV 89.1 88.5  PLT 526* 571*   Basic Metabolic Panel: Recent Labs  Lab 11/13/22 0557 11/16/22 0446  NA 136 132*  K 4.0 3.6  CL 101 99  CO2 26 22  GLUCOSE 99 103*  BUN 20 21  CREATININE 1.20 1.14  CALCIUM 8.7* 8.3*   GFR: Estimated Creatinine Clearance: 58.8 mL/min (by C-G formula based on SCr of 1.14 mg/dL). Liver Function Tests: Recent Labs  Lab  11/13/22 0557 11/16/22 0446  AST 93* 100*  ALT 111* 108*  ALKPHOS 101 130*  BILITOT 1.8* 1.7*  PROT 7.4 7.3  ALBUMIN 2.6* 2.6*   No results for input(s): "LIPASE", "AMYLASE" in the last 168 hours. No results for input(s): "AMMONIA" in the last 168 hours. Coagulation Profile: No results for input(s): "INR", "PROTIME" in the last 168 hours. Cardiac Enzymes: No results for input(s): "CKTOTAL", "CKMB", "CKMBINDEX", "TROPONINI" in the last 168 hours. BNP (last 3 results) No results for input(s): "PROBNP" in the last 8760 hours. HbA1C: No results for input(s): "HGBA1C" in the last 72 hours. CBG: Recent Labs  Lab 11/16/22 2114  GLUCAP 95    Lipid Profile: No results for input(s): "CHOL", "HDL", "LDLCALC", "TRIG", "CHOLHDL", "LDLDIRECT" in the last 72 hours. Thyroid Function Tests: No results for input(s): "TSH", "T4TOTAL", "FREET4", "T3FREE", "THYROIDAB" in the last 72 hours. Anemia Panel: No results for input(s): "VITAMINB12", "FOLATE", "FERRITIN", "TIBC", "IRON", "RETICCTPCT" in the last 72 hours. Sepsis Labs: No results for input(s): "PROCALCITON", "LATICACIDVEN" in the last 168 hours.  No results found for this or any previous visit (from the past 240 hour(s)).       Radiology Studies: No results found.      Scheduled Meds:  allopurinol  300 mg Oral Daily   apixaban  5 mg Oral BID   aspirin EC  81 mg Oral Daily   atorvastatin  80 mg Oral QPM   lisinopril  20 mg Oral Daily   metoprolol succinate  50 mg Oral Daily   nicotine  21 mg Transdermal Daily   pantoprazole  40 mg Oral BID   polyethylene glycol  17 g Oral BID   QUEtiapine  25 mg Oral QHS   senna  1 tablet Oral Daily   sodium chloride flush  3 mL Intravenous Q12H   thiamine  100 mg Oral Daily   Continuous Infusions:   LOS: 16 days     Tresa Moore, MD [facility]   If 7PM-7AM, please contact night-coverage  11/18/2022, 12:36 PM

## 2022-11-18 NOTE — Progress Notes (Signed)
Occupational Therapy Treatment Patient Details Name: Corey Olson MRN: [id number] DOB: [birth year] Today's Date: 11/18/2022   History of present illness Pt is a 67 yo male s/p bilateral femoral endarterectomy and iliac stenting. 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 PMH of COPD, gout, HLD, HTN, PVD, CVA, etoh.   OT comments  Pt seen for OT tx this date. Pt required full setup for breakfast and with cues for safety to minimize choking pt able to use spoon and fork to scoop food and bring to mouth with limited spillage. Pt did not eat much though (<25%). Pt then endorsing urgent BM. MAX A for rolling and +2 for bed pan placement. MAX A for rolling side to side afterwards. Pt able to utilize wipe for cleansing his penis requiring MIN A for thoroughness. MAX A for perineal cleansing following loose BM. Pt continues to benefit from skilled OT services.    Recommendations for follow up therapy are one component of a multi-disciplinary discharge planning process, led by the attending physician.  Recommendations may be updated based on patient status, additional functional criteria and insurance authorization.    Assistance Recommended at Discharge Frequent or constant Supervision/Assistance  Patient can return home with the following  Two people to help with walking and/or transfers;Two people to help with bathing/dressing/bathroom;Assistance with cooking/housework;Direct supervision/assist for medications management;Direct supervision/assist for financial management;Assist for transportation;Help with stairs or ramp for entrance   Equipment Recommendations  Other (comment) (defer to next venue)    Recommendations for Other Services      Precautions / Restrictions Precautions Precautions: Fall Precaution Comments: old R hemi, weakness globally Restrictions Weight  Bearing Restrictions: No       Mobility Bed Mobility Overal bed mobility: Needs Assistance Bed Mobility: Rolling Rolling: Max assist              Transfers                         Balance Overall balance assessment: Needs assistance   Sitting balance-Leahy Scale: Poor Sitting balance - Comments: long sitting in bed, requires pillows for positioning to maintain ideal upright positioning for feeding                                   ADL either performed or assessed with clinical judgement   ADL Overall ADL's : Needs assistance/impaired Eating/Feeding: Bed level;Set up;Supervision/ safety;Cueing for safety Eating/Feeding Details (indicate cue type and reason): With improved positioning using pillows and bed controls, pt required setup for all items, cues for smaller bites                       Toilet Transfer Details (indicate cue type and reason): MAX A to roll for bed pan placement Toileting- Clothing Manipulation and Hygiene: Maximal assistance;Bed level;+2 for physical assistance Toileting - Clothing Manipulation Details (indicate cue type and reason): rolling            Extremity/Trunk Assessment              Vision       Perception     Praxis      Cognition Arousal/Alertness: Awake/alert Behavior During Therapy: Flat affect Overall Cognitive Status: No family/caregiver present to determine baseline cognitive  functioning                                 General Comments: slow processing but does follow commands        Exercises      Shoulder Instructions       General Comments      Pertinent Vitals/ Pain       Pain Assessment Pain Assessment: No/denies pain  Home Living                                          Prior Functioning/Environment              Frequency  Min 2X/week        Progress Toward Goals  OT Goals(current goals can now be found in the care  plan section)  Progress towards OT goals: Progressing toward goals  Acute Rehab OT Goals Patient Stated Goal: to feel better OT Goal Formulation: With patient Time For Goal Achievement: 11/19/22 Potential to Achieve Goals: Fair  Plan Discharge plan remains appropriate;Frequency needs to be updated    Co-evaluation                 AM-PAC OT "6 Clicks" Daily Activity     Outcome Measure   Help from another person eating meals?: A Little Help from another person taking care of personal grooming?: A Lot Help from another person toileting, which includes using toliet, bedpan, or urinal?: Total Help from another person bathing (including washing, rinsing, drying)?: A Lot Help from another person to put on and taking off regular upper body clothing?: A Lot Help from another person to put on and taking off regular lower body clothing?: A Lot 6 Click Score: 12    End of Session    OT Visit Diagnosis: Other abnormalities of gait and mobility (R26.89);Muscle weakness (generalized) (M62.81)   Activity Tolerance Patient tolerated treatment well   Patient Left in bed;with call bell/phone within reach;with bed alarm set   Nurse Communication          Time: [phone] OT Time Calculation (min): 28 min  Charges: OT General Charges $OT Visit: 1 Visit OT Treatments $Self Care/Home Management : 23-37 mins  Arman Filter., MPH, MS, OTR/L ascom [phone] 11/18/22, 10:04 AM

## 2022-11-19 DIAGNOSIS — I998 Other disorder of circulatory system: Secondary | ICD-10-CM | POA: Diagnosis not present

## 2022-11-19 LAB — CBC WITH DIFFERENTIAL/PLATELET
Abs Immature Granulocytes: 0.03 10*3/uL (ref 0.00–0.07)
Basophils Absolute: 0 10*3/uL (ref 0.0–0.1)
Basophils Relative: 0 %
Eosinophils Absolute: 0.1 10*3/uL (ref 0.0–0.5)
Eosinophils Relative: 1 %
HCT: 32.2 % — ABNORMAL LOW (ref 39.0–52.0)
Hemoglobin: 10.2 g/dL — ABNORMAL LOW (ref 13.0–17.0)
Immature Granulocytes: 0 %
Lymphocytes Relative: 10 %
Lymphs Abs: 1 10*3/uL (ref 0.7–4.0)
MCH: 28.3 pg (ref 26.0–34.0)
MCHC: 31.7 g/dL (ref 30.0–36.0)
MCV: 89.2 fL (ref 80.0–100.0)
Monocytes Absolute: 0.5 10*3/uL (ref 0.1–1.0)
Monocytes Relative: 6 %
Neutro Abs: 8.2 10*3/uL — ABNORMAL HIGH (ref 1.7–7.7)
Neutrophils Relative %: 83 %
Platelets: 520 10*3/uL — ABNORMAL HIGH (ref 150–400)
RBC: 3.61 MIL/uL — ABNORMAL LOW (ref 4.22–5.81)
RDW: 14.6 % (ref 11.5–15.5)
WBC: 9.9 10*3/uL (ref 4.0–10.5)
nRBC: 0 % (ref 0.0–0.2)

## 2022-11-19 LAB — BASIC METABOLIC PANEL
Anion gap: 8 (ref 5–15)
BUN: 22 mg/dL (ref 8–23)
CO2: 24 mmol/L (ref 22–32)
Calcium: 8.8 mg/dL — ABNORMAL LOW (ref 8.9–10.3)
Chloride: 106 mmol/L (ref 98–111)
Creatinine, Ser: 1.09 mg/dL (ref 0.61–1.24)
GFR, Estimated: >60 mL/min (ref >60)
Glucose, Bld: 106 mg/dL — ABNORMAL HIGH (ref 70–99)
Potassium: 4.7 mmol/L (ref 3.5–5.1)
Sodium: 138 mmol/L (ref 135–145)

## 2022-11-19 NOTE — Plan of Care (Signed)

## 2022-11-19 NOTE — Progress Notes (Signed)
PROGRESS NOTE    Corey Olson  ZOX:[id number] DOB: 1955/09/06 DOA: 11/01/2022 PCP: [name], MD    Brief Narrative:  67 y.o. male with a history of stroke, peripheral atrial disease who presents with altered mental status, left leg weakness. Family spoke to him at 4:30 and felt that he was confused. Prior to that he had not been spoken to since the night before when he had seemed normal. Patient reports left leg weakness dragging his left foot while walking and decreased sensation, all of which were new but without clear timelines. 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  06/11: in ED, VSS, code stroke. Admitted to hospitalist service. Elevated AST/ALT (+)hepatic steatosis, rhabdo, (+)DVT L femoral and popliteal V.  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 Found to have weakneed pulses LLE --> ischemia/severe PAD LLE, heparin gtt, RLE and LLE angiogram. Significant vascular disease, limb likely not salvageable per Dr Wyn Quaker - prognosis poor for even AKA healing, will likely need extensive endarterectomy/stenting to even salvage an AKA. Can plan for surgery 06/13: anticipate surgery tomorrow. Cardiac clearance.  06/14: endarterectomy/stents bilateral femoral  06/15: recovering in ICU on heparin infusion. Sinus tach w/ elevated temp, (+)SIRS, (+)UA concern for sepsis d/t UTI vs SIRS w/ other medical issues and also eval for PE was neg.  06/16: 1 unit PRBC, CT LLE (+)hematoma/pseudoaneurysm, to OR w/ Dr Lenell Antu for evacuation hematoma and repair venous branch bleed.  06/17: some bleeding around drain site overnight requiring few dressing changes, 1 unit PRBC ordered, hold heparin will monitor.  06/18: stable. Heparin restarted.  06/19: Heparin --> Eliquis. Wound vacs placed to groin incisions.  6/20-6/24: Alert but minimally interactive. Vascular  following along for management of wounds.Discharge pending completion of vascular treatments.  6/25: per vascular note: Vascular Surgery is okay with patient discharge today. Okay to discontinue right groin Prevena Wound Vac tomorrow  11/16/2022. Left groin wound vac will need to stay in place until 21 days post op and staple removal. Continue to avoid narcotics as this makes the patient confused and agitated.Continue Eliquis 5 mg twice daily and ASA 81 mg daily. Continue to work with PT and OT.  6/27: Patient discontinued wound VAC.  Dry gauze dressing recommended per vascular surgery. 6/28: No acute issues.  Pending placement  Assessment & Plan:   Principal Problem:   Ischemia of extremity Active Problems:   Fall at home, initial encounter   Tobacco abuse   PAD (peripheral artery disease) (HCC)   Atherosclerosis of native arteries of extremity with intermittent claudication (HCC)   Renal artery stenosis (HCC)   Essential hypertension   Hyperlipidemia   Alcohol abuse   RUE weakness   Non-traumatic rhabdomyolysis   Positive D dimer   Abnormal EKG   Fall   Limb ischemia   Cerebrovascular accident (CVA) (HCC)   Coronary artery disease of native artery of native heart with stable angina pectoris (HCC)   Hematoma   LLE w/ severe PAD, ischemic L foot w/ neurological deficits likely cause for fall S/p angiograpy 06/12 and endarterectomy/stenting 06/14 Left leg weakness. Denies pain at site of post-op hematoma and the swelling in L groin has been stable throughout my evaluation period. He continues to deny pain. PT recommending further therapy after dc. SNF placement pending. Right groin Prevena wound dressing discontinued by RN 6/26.  Patient self  discontinued left groin dressing on 6/27 Plan: Continue Eliquis, aspirin, atorvastatin Dry gauze placed on left groin wound Continue nightly Seroquel Medically stable for discharge.   Plan to discharge to skilled nurse facility Monday  7/1  Postoperative hematoma  evacuated 06/16. Hemoglobin stable Continue to monitor   Constipation resolved. BM 6/25 - senna, miralax - suppository PRN   Multivessel CAD  HTN  HLD Previous Lexiscan w/o ischemia Outpatient cardiology follow-up   DVT L femoral and popliteal V  PE ruled out  Continue Eliquis   Rhabdomyolysis w/ normal renal function  CK peaked at greater than 4400, downtrending   History CVA w/ residual R sided deficits (+)subacute R cerebellar punctate infarct on MRI He is currently on aspirin, Eliquis, and high intensity, statin, Lipitor 80 mg daily.   Transaminitis - Hepatic Steatosis on RUQ [place] EtOH use but no acute intoxication based on blood levels -  question alcoholic hepatitis  Follow CMP    Weakness and fall Confusion / AMS question toxic metabolic encephalopathy or CVA Likely multifactorial. Pressing concern for ischemic LLE as above w/ neuro deficits predisposing to fall  Concern for CVA see MRI brain Potential EtOH withdrawal   EtOH use but no acute intoxication based on blood levels Outside withdrawal window since last known drink Thiamine replacement   Refractory hypokalemia- resolved Continue to replace as needed Monitor BMP   Essential HTN- moderately well controlled   Tobacco abuse Nicotine patch    DVT prophylaxis: Eliquis Code Status: Full Family Communication: None Disposition Plan: Status is: Inpatient Remains inpatient appropriate because: Multiple acute issues as above   Level of care: Progressive  Consultants:  Vascular surgery Palliative care  Procedures:  Multiple  Antimicrobials: None   Subjective: Seen and examined.  Resting in bed.  No visible distress.  Not agitated  Objective: Vitals:   11/19/22 0314 11/19/22 0500 11/19/22 0758 11/19/22 1156  BP: 94/81  121/67 108/64  Pulse: 95  94 92  Resp: 20  15 15   Temp: 98.7 F (37.1 C)  98.4 F (36.9 C) (!) 97.4 F (36.3 C)  TempSrc: Oral  Oral    SpO2: 100%  98% 100%  Weight:  68.4 kg    Height:        Intake/Output Summary (Last 24 hours) at 11/19/2022 1255 Last data filed at 11/19/2022 0758 Gross per 24 hour  Intake 240 ml  Output 275 ml  Net -35 ml    Filed Weights   11/13/22 0500 11/14/22 0500 11/19/22 0500  Weight: 71.7 kg 68.7 kg 68.4 kg    Examination:  General exam: NAD Respiratory system: Clear to auscultation. Respiratory effort normal. Cardiovascular system: S1-2, RRR, no murmurs, no pedal edema Gastrointestinal system: Soft, NT/ND, normal bowel sounds Central nervous system: Alert and oriented. No focal neurological deficits. Extremities: Bilateral groin dressings in place Skin: No rashes, lesions or ulcers Psychiatry: Judgement and insight appear impaired. Mood & affect flattened.     Data Reviewed: I have personally reviewed following labs and imaging studies  CBC: Recent Labs  Lab 11/13/22 0557 11/16/22 0446 11/19/22 0833  WBC 17.8* 14.2* 9.9  NEUTROABS  --   --  8.2*  HGB 10.6* 9.8* 10.2*  HCT 31.8* 30.9* 32.2*  MCV 89.1 88.5 89.2  PLT 526* 571* 520*   Basic Metabolic Panel: Recent Labs  Lab 11/13/22 0557 11/16/22 0446 11/19/22 0833  NA 136 132* 138  K 4.0 3.6 4.7  CL 101 99 106  CO2 26 22 24   GLUCOSE  99 103* 106*  BUN 20 21 22   CREATININE 1.20 1.14 1.09  CALCIUM 8.7* 8.3* 8.8*   GFR: Estimated Creatinine Clearance: 61.5 mL/min (by C-G formula based on SCr of 1.09 mg/dL). Liver Function Tests: Recent Labs  Lab 11/13/22 0557 11/16/22 0446  AST 93* 100*  ALT 111* 108*  ALKPHOS 101 130*  BILITOT 1.8* 1.7*  PROT 7.4 7.3  ALBUMIN 2.6* 2.6*   No results for input(s): "LIPASE", "AMYLASE" in the last 168 hours. No results for input(s): "AMMONIA" in the last 168 hours. Coagulation Profile: No results for input(s): "INR", "PROTIME" in the last 168 hours. Cardiac Enzymes: No results for input(s): "CKTOTAL", "CKMB", "CKMBINDEX", "TROPONINI" in the last 168 hours. BNP (last 3  results) No results for input(s): "PROBNP" in the last 8760 hours. HbA1C: No results for input(s): "HGBA1C" in the last 72 hours. CBG: Recent Labs  Lab 11/16/22 2114  GLUCAP 95   Lipid Profile: No results for input(s): "CHOL", "HDL", "LDLCALC", "TRIG", "CHOLHDL", "LDLDIRECT" in the last 72 hours. Thyroid Function Tests: No results for input(s): "TSH", "T4TOTAL", "FREET4", "T3FREE", "THYROIDAB" in the last 72 hours. Anemia Panel: No results for input(s): "VITAMINB12", "FOLATE", "FERRITIN", "TIBC", "IRON", "RETICCTPCT" in the last 72 hours. Sepsis Labs: No results for input(s): "PROCALCITON", "LATICACIDVEN" in the last 168 hours.  No results found for this or any previous visit (from the past 240 hour(s)).       Radiology Studies: No results found.      Scheduled Meds:  allopurinol  300 mg Oral Daily   apixaban  5 mg Oral BID   aspirin EC  81 mg Oral Daily   atorvastatin  80 mg Oral QPM   lisinopril  20 mg Oral Daily   metoprolol succinate  50 mg Oral Daily   nicotine  21 mg Transdermal Daily   pantoprazole  40 mg Oral BID   polyethylene glycol  17 g Oral BID   QUEtiapine  25 mg Oral QHS   senna  1 tablet Oral Daily   sodium chloride flush  3 mL Intravenous Q12H   thiamine  100 mg Oral Daily   Continuous Infusions:   LOS: 17 days     Tresa Moore, MD [facility]   If 7PM-7AM, please contact night-coverage  11/19/2022, 12:55 PM

## 2022-11-20 DIAGNOSIS — I998 Other disorder of circulatory system: Secondary | ICD-10-CM | POA: Diagnosis not present

## 2022-11-20 NOTE — Progress Notes (Signed)
PROGRESS NOTE    Corey Olson  ZOX:[id number] DOB: 12-09-1955 DOA: 11/01/2022 PCP: [name], MD    Brief Narrative:  67 y.o. male with a history of stroke, peripheral atrial disease who presents with altered mental status, left leg weakness. Family spoke to him at 4:30 and felt that he was confused. Prior to that he had not been spoken to since the night before when he had seemed normal. Patient reports left leg weakness dragging his left foot while walking and decreased sensation, all of which were new but without clear timelines. 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  06/11: in ED, VSS, code stroke. Admitted to hospitalist service. Elevated AST/ALT (+)hepatic steatosis, rhabdo, (+)DVT L femoral and popliteal V.  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 Found to have weakneed pulses LLE --> ischemia/severe PAD LLE, heparin gtt, RLE and LLE angiogram. Significant vascular disease, limb likely not salvageable per Dr Wyn Quaker - prognosis poor for even AKA healing, will likely need extensive endarterectomy/stenting to even salvage an AKA. Can plan for surgery 06/13: anticipate surgery tomorrow. Cardiac clearance.  06/14: endarterectomy/stents bilateral femoral  06/15: recovering in ICU on heparin infusion. Sinus tach w/ elevated temp, (+)SIRS, (+)UA concern for sepsis d/t UTI vs SIRS w/ other medical issues and also eval for PE was neg.  06/16: 1 unit PRBC, CT LLE (+)hematoma/pseudoaneurysm, to OR w/ Dr Lenell Antu for evacuation hematoma and repair venous branch bleed.  06/17: some bleeding around drain site overnight requiring few dressing changes, 1 unit PRBC ordered, hold heparin will monitor.  06/18: stable. Heparin restarted.  06/19: Heparin --> Eliquis. Wound vacs placed to groin incisions.  6/20-6/24: Alert but minimally interactive. Vascular  following along for management of wounds.Discharge pending completion of vascular treatments.  6/25: per vascular note: Vascular Surgery is okay with patient discharge today. Okay to discontinue right groin Prevena Wound Vac tomorrow  11/16/2022. Left groin wound vac will need to stay in place until 21 days post op and staple removal. Continue to avoid narcotics as this makes the patient confused and agitated.Continue Eliquis 5 mg twice daily and ASA 81 mg daily. Continue to work with PT and OT.  6/27: Patient discontinued wound VAC.  Dry gauze dressing recommended per vascular surgery. 6/28: No acute issues.  Pending placement  Assessment & Plan:   Principal Problem:   Ischemia of extremity Active Problems:   Fall at home, initial encounter   Tobacco abuse   PAD (peripheral artery disease) (HCC)   Atherosclerosis of native arteries of extremity with intermittent claudication (HCC)   Renal artery stenosis (HCC)   Essential hypertension   Hyperlipidemia   Alcohol abuse   RUE weakness   Non-traumatic rhabdomyolysis   Positive D dimer   Abnormal EKG   Fall   Limb ischemia   Cerebrovascular accident (CVA) (HCC)   Coronary artery disease of native artery of native heart with stable angina pectoris (HCC)   Hematoma   LLE w/ severe PAD, ischemic L foot w/ neurological deficits likely cause for fall S/p angiograpy 06/12 and endarterectomy/stenting 06/14 Left leg weakness. Denies pain at site of post-op hematoma and the swelling in L groin has been stable throughout my evaluation period. He continues to deny pain. PT recommending further therapy after dc. SNF placement pending. Right groin Prevena wound dressing discontinued by RN 6/26.  Patient self  discontinued left groin dressing on 6/27 Plan: Continue Eliquis, aspirin, atorvastatin Dry gauze placed on left groin wound Continue nightly Seroquel Medically stable for discharge.   Per TOC discharge 7/1  Postoperative hematoma   evacuated 06/16. Hemoglobin stable Continue to monitor   Constipation resolved. BM 6/25 - senna, miralax - suppository PRN   Multivessel CAD  HTN  HLD Previous Lexiscan w/o ischemia Outpatient cardiology follow-up   DVT L femoral and popliteal V  PE ruled out  Continue Eliquis   Rhabdomyolysis w/ normal renal function  CK peaked at greater than 4400, downtrending   History CVA w/ residual R sided deficits (+)subacute R cerebellar punctate infarct on MRI He is currently on aspirin, Eliquis, and high intensity, statin, Lipitor 80 mg daily.   Transaminitis - Hepatic Steatosis on RUQ [place] EtOH use but no acute intoxication based on blood levels -  question alcoholic hepatitis  Follow CMP    Weakness and fall Confusion / AMS question toxic metabolic encephalopathy or CVA Likely multifactorial. Pressing concern for ischemic LLE as above w/ neuro deficits predisposing to fall  Concern for CVA see MRI brain Potential EtOH withdrawal   EtOH use but no acute intoxication based on blood levels Outside withdrawal window since last known drink Thiamine replacement   Refractory hypokalemia- resolved Continue to replace as needed Monitor BMP   Essential HTN- moderately well controlled   Tobacco abuse Nicotine patch    DVT prophylaxis: Eliquis Code Status: Full Family Communication: None Disposition Plan: Status is: Inpatient Remains inpatient appropriate because: Multiple acute issues as above   Level of care: Progressive  Consultants:  Vascular surgery Palliative care  Procedures:  Multiple  Antimicrobials: None   Subjective: Seen and examined.  Resting in bed.  No visible distress.  Not agitated  Objective: Vitals:   11/19/22 2312 11/20/22 0357 11/20/22 0500 11/20/22 0904  BP: 126/71 102/70  (!) 118/94  Pulse: (!) 102 93  (!) 106  Resp: 16 18  16   Temp: 98.3 F (36.8 C) 98.1 F (36.7 C)  97.6 F (36.4 C)  TempSrc:    Oral  SpO2: 99% 99%  100%   Weight:   68.2 kg   Height:        Intake/Output Summary (Last 24 hours) at 11/20/2022 0923 Last data filed at 11/19/2022 2045 Gross per 24 hour  Intake 120 ml  Output 250 ml  Net -130 ml    Filed Weights   11/14/22 0500 11/19/22 0500 11/20/22 0500  Weight: 68.7 kg 68.4 kg 68.2 kg    Examination:  General exam: No acute distress Respiratory system: Clear to auscultation. Respiratory effort normal. Cardiovascular system: S1-2, RRR, no murmurs, no pedal edema Gastrointestinal system: Soft, NT/ND, normal bowel sounds Central nervous system: Alert and oriented. No focal neurological deficits. Extremities: Bilateral groin dressings in place Skin: No rashes, lesions or ulcers Psychiatry: Judgement and insight appear impaired. Mood & affect flattened.     Data Reviewed: I have personally reviewed following labs and imaging studies  CBC: Recent Labs  Lab 11/16/22 0446 11/19/22 0833  WBC 14.2* 9.9  NEUTROABS  --  8.2*  HGB 9.8* 10.2*  HCT 30.9* 32.2*  MCV 88.5 89.2  PLT 571* 520*   Basic Metabolic Panel: Recent Labs  Lab 11/16/22 0446 11/19/22 0833  NA 132* 138  K 3.6 4.7  CL 99 106  CO2 22 24  GLUCOSE 103* 106*  BUN 21 22  CREATININE 1.14 1.09  CALCIUM 8.3* 8.8*   GFR:  Estimated Creatinine Clearance: 61.5 mL/min (by C-G formula based on SCr of 1.09 mg/dL). Liver Function Tests: Recent Labs  Lab 11/16/22 0446  AST 100*  ALT 108*  ALKPHOS 130*  BILITOT 1.7*  PROT 7.3  ALBUMIN 2.6*   No results for input(s): "LIPASE", "AMYLASE" in the last 168 hours. No results for input(s): "AMMONIA" in the last 168 hours. Coagulation Profile: No results for input(s): "INR", "PROTIME" in the last 168 hours. Cardiac Enzymes: No results for input(s): "CKTOTAL", "CKMB", "CKMBINDEX", "TROPONINI" in the last 168 hours. BNP (last 3 results) No results for input(s): "PROBNP" in the last 8760 hours. HbA1C: No results for input(s): "HGBA1C" in the last 72  hours. CBG: Recent Labs  Lab 11/16/22 2114  GLUCAP 95   Lipid Profile: No results for input(s): "CHOL", "HDL", "LDLCALC", "TRIG", "CHOLHDL", "LDLDIRECT" in the last 72 hours. Thyroid Function Tests: No results for input(s): "TSH", "T4TOTAL", "FREET4", "T3FREE", "THYROIDAB" in the last 72 hours. Anemia Panel: No results for input(s): "VITAMINB12", "FOLATE", "FERRITIN", "TIBC", "IRON", "RETICCTPCT" in the last 72 hours. Sepsis Labs: No results for input(s): "PROCALCITON", "LATICACIDVEN" in the last 168 hours.  No results found for this or any previous visit (from the past 240 hour(s)).       Radiology Studies: No results found.      Scheduled Meds:  allopurinol  300 mg Oral Daily   apixaban  5 mg Oral BID   aspirin EC  81 mg Oral Daily   atorvastatin  80 mg Oral QPM   lisinopril  20 mg Oral Daily   metoprolol succinate  50 mg Oral Daily   nicotine  21 mg Transdermal Daily   pantoprazole  40 mg Oral BID   polyethylene glycol  17 g Oral BID   QUEtiapine  25 mg Oral QHS   senna  1 tablet Oral Daily   sodium chloride flush  3 mL Intravenous Q12H   thiamine  100 mg Oral Daily   Continuous Infusions:   LOS: 18 days     Tresa Moore, MD [facility]   If 7PM-7AM, please contact night-coverage  11/20/2022, 9:23 AM

## 2022-11-20 NOTE — Discharge Summary (Signed)
Physician Discharge Summary  Corey Olson UEA:[id number] DOB: 23-Nov-1955 DOA: 11/01/2022  PCP: [name], MD  Admit date: 11/01/2022 Discharge date: 11/22/2022  Admitted From: Home Disposition:  SNF  Recommendations for Outpatient Follow-up:  Follow up with PCP in 1-2 weeks   Home Health:No Equipment/Devices:None   Discharge Condition:Stable  CODE STATUS:FULL  Diet recommendation: Dysphagia 3  Brief/Interim Summary: 67 y.o. male with a history of stroke, peripheral atrial disease who presents with altered mental status, left leg weakness. Family spoke to him at 4:30 and felt that he was confused. Prior to that he had not been spoken to since the night before when he had seemed normal. Patient reports left leg weakness dragging his left foot while walking and decreased sensation, all of which were new but without clear timelines. 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  06/11: in ED, VSS, code stroke. Admitted to hospitalist service. Elevated AST/ALT (+)hepatic steatosis, rhabdo, (+)DVT L femoral and popliteal V.  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 Found to have weakneed pulses LLE --> ischemia/severe PAD LLE, heparin gtt, RLE and LLE angiogram. Significant vascular disease, limb likely not salvageable per Dr Wyn Quaker - prognosis poor for even AKA healing, will likely need extensive endarterectomy/stenting to even salvage an AKA. Can plan for surgery 06/13: anticipate surgery tomorrow. Cardiac clearance.  06/14: endarterectomy/stents bilateral femoral  06/15: recovering in ICU on heparin infusion. Sinus tach w/ elevated temp, (+)SIRS, (+)UA concern for sepsis d/t UTI vs SIRS w/ other medical issues and also eval for PE was neg.  06/16: 1 unit PRBC, CT LLE (+)hematoma/pseudoaneurysm, to OR w/ Dr Lenell Antu for evacuation hematoma and repair venous  branch bleed.  06/17: some bleeding around drain site overnight requiring few dressing changes, 1 unit PRBC ordered, hold heparin will monitor.  06/18: stable. Heparin restarted.  06/19: Heparin --> Eliquis. Wound vacs placed to groin incisions.  6/20-6/24: Alert but minimally interactive. Vascular following along for management of wounds.Discharge pending completion of vascular treatments.  6/25: per vascular note: Vascular Surgery is okay with patient discharge today. Okay to discontinue right groin Prevena Wound Vac tomorrow  11/16/2022. Left groin wound vac will need to stay in place until 21 days post op and staple removal. Continue to avoid narcotics as this makes the patient confused and agitated.Continue Eliquis 5 mg twice daily and ASA 81 mg daily. Continue to work with PT and OT.  6/27: Patient discontinued wound VAC.  Dry gauze dressing recommended per vascular surgery. 6/28: No acute issues.  Pending placement 7/1: Insurance auth obtained.  Stable for dc to SNF 7/2: Discharge delayed by lack of available transport services.  Patient transported to SNF today.  FU OP vascular surgery 1-2 weeks.    Discharge Diagnoses:  Principal Problem:   Ischemia of extremity Active Problems:   Fall at home, initial encounter   Tobacco abuse   PAD (peripheral artery disease) (HCC)   Atherosclerosis of native arteries of extremity with intermittent claudication (HCC)   Renal artery stenosis (HCC)   Essential hypertension   Hyperlipidemia   Alcohol abuse   RUE weakness   Non-traumatic rhabdomyolysis   Positive D dimer   Abnormal EKG   Fall   Limb ischemia   Cerebrovascular accident (CVA) (HCC)   Coronary artery disease of native artery of native heart with stable angina pectoris (HCC)  Hematoma  LLE w/ severe PAD, ischemic L foot w/ neurological deficits likely cause for fall S/p angiograpy 06/12 and endarterectomy/stenting 06/14 Left leg weakness. Denies pain at site of post-op  hematoma and the swelling in L groin has been stable throughout my evaluation period. He continues to deny pain. PT recommending further therapy after dc. SNF placement pending. Right groin Prevena wound dressing discontinued by RN 6/26.  Patient self discontinued left groin dressing on 6/27 Plan: Continue Eliquis, aspirin, atorvastatin Dry gauze placed on left groin wound Medically stable for discharge.   Discharge to SNF 7/1   Postoperative hematoma  evacuated 06/16. Hemoglobin stable Continue to monitor   Constipation resolved. BM 6/25 - senna, miralax - suppository PRN   Multivessel CAD  HTN  HLD Previous Lexiscan w/o ischemia Outpatient cardiology follow-up   DVT L femoral and popliteal V  PE ruled out  Continue Eliquis   Rhabdomyolysis w/ normal renal function  CK peaked at greater than 4400, normalized   History CVA w/ residual R sided deficits (+)subacute R cerebellar punctate infarct on MRI He is currently on aspirin, Eliquis, and high intensity, statin, Lipitor 80 mg daily.   Transaminitis - Hepatic Steatosis on RUQ [place] EtOH use but no acute intoxication based on blood levels -  question alcoholic hepatitis    Discharge Instructions  Discharge Instructions     Diet - low sodium heart healthy   Complete by: As directed    Increase activity slowly   Complete by: As directed    No wound care   Complete by: As directed       Allergies as of 11/21/2022   No Known Allergies      Medication List     STOP taking these medications    amLODipine 10 MG tablet Commonly known as: NORVASC   Apixaban Starter Pack (10mg  and 5mg ) Commonly known as: ELIQUIS STARTER PACK Replaced by: apixaban 5 MG Tabs tablet   clopidogrel 75 MG tablet Commonly known as: Plavix       TAKE these medications    acetaminophen 500 MG tablet Commonly known as: TYLENOL Take 500 mg by mouth every 6 (six) hours as needed for moderate pain or mild pain (lEG PAIN).    albuterol 108 (90 Base) MCG/ACT inhaler Commonly known as: VENTOLIN HFA Inhale 2 puffs into the lungs every 6 (six) hours as needed for wheezing or shortness of breath.   allopurinol 300 MG tablet Commonly known as: ZYLOPRIM Take 300 mg by mouth daily.   apixaban 5 MG Tabs tablet Commonly known as: ELIQUIS Take 1 tablet (5 mg total) by mouth 2 (two) times daily. Replaces: Apixaban Starter Pack (10mg  and 5mg )   aspirin EC 81 MG tablet Take 325 mg by mouth daily.   atorvastatin 80 MG tablet Commonly known as: LIPITOR Take 80 mg by mouth daily.   budesonide-formoterol 160-4.5 MCG/ACT inhaler Commonly known as: SYMBICORT Inhale 2 puffs into the lungs daily.   gabapentin 100 MG capsule Commonly known as: NEURONTIN Take 100 mg by mouth 2 (two) times daily.   lisinopril 20 MG tablet Commonly known as: ZESTRIL Take 20 mg by mouth daily.   metoprolol succinate 50 MG 24 hr tablet Commonly known as: TOPROL-XL Take 50 mg by mouth daily.   pantoprazole 40 MG tablet Commonly known as: PROTONIX Take 1 tablet (40 mg total) by mouth daily.   polyethylene glycol 17 g packet Commonly known as: MIRALAX / GLYCOLAX Take 17 g by mouth 2 (two) times  daily.   thiamine 100 MG tablet Commonly known as: Vitamin B-1 Take 1 tablet (100 mg total) by mouth daily.        No Known Allergies  Consultations: Palliative care Cardiology Vascular Neurology   Procedures/Studies: CT ANGIO AO+BIFEM W & OR WO CONTRAST  Addendum Date: 11/06/2022   ADDENDUM REPORT: 11/06/2022 11:10 ADDENDUM: Laterality error in impression #1,  which should read: 1. 6.8 cm hematoma in the deep subcutaneous tissues overlying the LEFT SFA, with possible pseudoaneurysm. Electronically Signed   By: Corlis Leak M.D.   On: 11/06/2022 11:10   Result Date: 11/06/2022 CLINICAL DATA:  Soft tissue mass deep thigh EXAM: CT ANGIOGRAPHY OF ABDOMINAL AORTA WITH ILIOFEMORAL RUNOFF TECHNIQUE: Multidetector CT imaging of the  abdomen, pelvis and lower extremities was performed using the standard protocol during bolus administration of intravenous contrast. Multiplanar CT image reconstructions and MIPs were obtained to evaluate the vascular anatomy. RADIATION DOSE REDUCTION: This exam was performed according to the departmental dose-optimization program which includes automated exposure control, adjustment of the mA and/or kV according to patient size and/or use of iterative reconstruction technique. CONTRAST:  OMNIPAQUE IOHEXOL 350 MG/ML SOLN COMPARISON:  11/02/2022 FINDINGS: VASCULAR Aorta: Moderate calcified atheromatous plaque. No aneurysm, dissection, or significant stenosis. Eccentric intraluminal thrombus in the infrarenal segment without high-grade stenosis as before, an a embolic risk. Celiac: Common origin with SMA. There is heavy calcified plaque with mild ostial stenosis of doubtful hemodynamic significance. SMA: Common origin with the celiac axis. Mild origin stenosis, and scattered calcified atheromatous plaque distally resulting in tandem areas of stenosis. Renals: Single right, with heavily calcified ostial plaque over length of approximately 1.2 cm resulting in at least mild stenosis, patent distally. Single left, with heavily calcified ostial plaque over length of 1.6 cm resulting in stenosis of at least moderate severity, atheromatous but patent distally. IMA: Patent, diminutive RIGHT Lower Extremity Inflow: Common iliac heavily calcified plaque. Patent stent in across its bifurcation. Internal iliac origin occlusion External iliac heavily atheromatous but patent with overlapping stents through its length. Outflow: Common femoral patent. Deep femoral branches patent. SFA long segment occlusion from just beyond its origin through its length. Popliteal is atheromatous, with a small patent channel reconstituted by collaterals. Runoff: Heavily calcified proximally, limiting luminal evaluation. There is enhancement of  the distal anterior tibial artery crossing the ankle as dorsalis pedis. The peroneal artery is opacified just above the calf. LEFT Lower Extremity Inflow: Common iliac heavily atheromatous but now patent, stented through its length. Internal iliac long segment origin occlusion. External iliac atheromatous, stented and now patent through its length Outflow: Common femoral patent. Deep femoral branches patent. SFA long segment occlusion through its length. Popliteal long segment occlusion from its origin. There is collateral reconstitution of a small patent channel below the knee. Runoff: Scattered calcified atheromatous plaque. Anterior and posterior tibial arteries occlude proximal calf. Peroneal artery is diminutive but appears patent to just above the ankle. Veins: No obvious venous abnormality within the limitations of this arterial phase study. Review of the MIP images confirms the above findings. NON-VASCULAR Lower chest: No pleural or pericardial effusion. Coronary calcifications. Hepatobiliary: Multiple small cysts as before. No biliary ductal dilatation. Gallbladder incompletely distended. Pancreas: Unremarkable. No pancreatic ductal dilatation or surrounding inflammatory changes. Spleen: Normal in size without focal abnormality. Adrenals/Urinary Tract: No adrenal mass. Symmetric renal parenchymal enhancement without hydronephrosis or worrisome lesion. Urinary bladder partially decompressed. Foley catheter balloon has been inflated within a small anterior diverticulum, tip isolated from lumen  of urinary bladder. Stomach/Bowel: Stomach is nondistended. Small bowel decompressed. Normal appendix. Colon partially distended by gas and fecal material with scattered diverticula from the descending and proximal sigmoid segments. Mild inflammatory changes posterior to the distal descending colon (Im121,Se6) without abscess. Lymphatic: No abdominal or pelvic adenopathy. Reproductive: Prostate enlargement with central  coarse calcifications. Other: No ascites.  No free air. Musculoskeletal: 5.3 cm hematoma overlying proximal right SFA. 6.8 cm hematoma in the deep subcutaneous tissues overlying proximal right SFA, with possible enhancing neck suggesting pseudoaneurysm. Postop changes and skin staples overlying bilateral common femoral vessels. Left knee effusion. Left thigh and calf soft tissue swelling with subcutaneous edematous change. Regional bones unremarkable. IMPRESSION: 1. 6.8 cm hematoma in the deep subcutaneous tissues overlying proximal right SFA, with possible enhancing neck suggesting pseudoaneurysm. 2. 5.3 cm hematoma overlying proximal right SFA. 3. Restoration of flow through the left common and external iliac arteries. 4. Bilateral long segment SFA and popliteal artery occlusions with collateral reconstitution of a small patent channel below the knee. 5. Foley catheter balloon malpositioned within a small anterior bladder diverticulum, tip isolated from lumen of urinary bladder. Recommend repositioning. 6. Descending and sigmoid diverticulosis with mild inflammatory changes posterior to the distal descending colon, possible mild diverticulitis. 7.  Aortic Atherosclerosis (ICD10-I70.0). Electronically Signed: By: Corlis Leak M.D. On: 11/06/2022 08:20   DG Chest Port 1 View  Result Date: 11/05/2022 CLINICAL DATA:  Abnormal breath sounds EXAM: PORTABLE CHEST 1 VIEW COMPARISON:  09/10/2020 FINDINGS: Transverse diameter of heart is increased. Thoracic aorta is tortuous and ectatic. There are no signs of pulmonary edema more focal pulmonary consolidation. There is no pleural effusion or pneumothorax. IMPRESSION: There are no signs of pulmonary edema or focal pulmonary consolidation. Electronically Signed   By: Ernie Avena M.D.   On: 11/05/2022 17:19   CT Angio Chest Pulmonary Embolism (PE) W or WO Contrast  Result Date: 11/05/2022 CLINICAL DATA:  High clinical suspicion for PE EXAM: CT ANGIOGRAPHY CHEST  WITH CONTRAST TECHNIQUE: Multidetector CT imaging of the chest was performed using the standard protocol during bolus administration of intravenous contrast. Multiplanar CT image reconstructions and MIPs were obtained to evaluate the vascular anatomy. RADIATION DOSE REDUCTION: This exam was performed according to the departmental dose-optimization program which includes automated exposure control, adjustment of the mA and/or kV according to patient size and/or use of iterative reconstruction technique. CONTRAST:  75mL OMNIPAQUE IOHEXOL 350 MG/ML SOLN COMPARISON:  Noncontrast CT chest done on 08/05/2020 and 11/02/2022 FINDINGS: Cardiovascular: There are no intraluminal filling defects in pulmonary artery branches. There is homogeneous enhancement in thoracic aorta. Atherosclerotic plaques and calcifications are seen in thoracic aorta and its major branches. Coronary artery calcifications are seen. Minimal pericardial effusion is present. Mediastinum/Nodes: No significant lymphadenopathy is seen. Lungs/Pleura: Small patchy densities are seen in posterior right apical region. Similar finding was seen in the previous examination. There are small linear densities in both apices. There is no new focal consolidation. There is no pleural effusion or pneumothorax. Upper Abdomen: There is high density in the dependent portion of gallbladder lumen suggesting possible gallbladder stones. There is no dilation of bile ducts. Numerous scattered small low-density foci in liver with no significant change. Fairly extensive arterial calcifications are seen. There is 2 mm calcific density in the upper pole of left kidney suggesting possible small nonobstructing stone. There is 2.7 cm cyst in the upper pole of right kidney. There are other small subcentimeter low-density foci in the right kidney. Musculoskeletal: No acute findings  are seen. Review of the MIP images confirms the above findings. IMPRESSION: There is no evidence of  pulmonary artery embolism. There is no evidence of thoracic aortic dissection. Small focus of increased markings in the posterior right apical region has not changed significantly, possibly scarring. There is no focal pulmonary consolidation. There is no pleural effusion. Coronary artery disease. Aortic arteriosclerosis. Possible gallbladder stones. There are numerous small low-density foci in the liver with no significant interval change, possibly cysts. 2 mm left renal stone. There are right renal cysts measuring up to 2.7 cm. Electronically Signed   By: Ernie Avena M.D.   On: 11/05/2022 17:18   DG C-Arm 1-60 Min-No Report  Result Date: 11/04/2022 Fluoroscopy was utilized by the requesting physician.  No radiographic interpretation.   ECHOCARDIOGRAM COMPLETE  Result Date: 11/03/2022    ECHOCARDIOGRAM REPORT   Patient Name:   SOLIMAN PULEIO Date of Exam: 11/03/2022 Medical Rec #:  [id number]    Height:       67.0 in Accession #:    [id number]   Weight:       176.6 lb Date of Birth:  1955-12-02     BSA:          1.918 m Patient Age:    67 years     BP:           157/70 mmHg Patient Gender: M            HR:           97 bpm. Exam Location:  [facility] Procedure: 2D Echo, Cardiac Doppler, Color Doppler, Strain Analysis and 3D Echo Indications:     CAD  History:         Patient has prior history of Echocardiogram examinations, most                  recent 07/09/2020. CAD, Abnormal ECG, Stroke, PAD and COPD; Risk                  Factors:Hypertension, Dyslipidemia and Current Smoker. ETOH                  abuse.  Sonographer:     Mikki Harbor Referring Phys:  [id number] Antonieta Iba Diagnosing Phys: Julien Nordmann MD  Sonographer Comments: Global longitudinal strain was attempted. IMPRESSIONS  1. Left ventricular ejection fraction, by estimation, is 50 to 55%. The left ventricle has low normal function. The left ventricle has no regional wall motion abnormalities. Left ventricular diastolic parameters are  consistent with Grade I diastolic dysfunction (impaired relaxation). The average left ventricular global longitudinal strain is -8.9 %.  2. Right ventricular systolic function is normal. The right ventricular size is normal.  3. The mitral valve is normal in structure. Mild mitral valve regurgitation. No evidence of mitral stenosis.  4. The aortic valve is tricuspid. Aortic valve regurgitation is not visualized. Aortic valve sclerosis is present, with no evidence of aortic valve stenosis.  5. There is borderline dilatation of the aortic root, measuring 37 mm.  6. The inferior vena cava is normal in size with greater than 50% respiratory variability, suggesting right atrial pressure of 3 mmHg. FINDINGS  Left Ventricle: Left ventricular ejection fraction, by estimation, is 50 to 55%. The left ventricle has low normal function. The left ventricle has no regional wall motion abnormalities. The average left ventricular global longitudinal strain is -8.9 %.  The left ventricular internal cavity size was normal in size. There is no left  ventricular hypertrophy. Left ventricular diastolic parameters are consistent with Grade I diastolic dysfunction (impaired relaxation). Right Ventricle: The right ventricular size is normal. No increase in right ventricular wall thickness. Right ventricular systolic function is normal. Left Atrium: Left atrial size was normal in size. Right Atrium: Right atrial size was normal in size. Pericardium: There is no evidence of pericardial effusion. Mitral Valve: The mitral valve is normal in structure. Mild mitral valve regurgitation. No evidence of mitral valve stenosis. MV peak gradient, 6.9 mmHg. The mean mitral valve gradient is 2.0 mmHg. Tricuspid Valve: The tricuspid valve is normal in structure. Tricuspid valve regurgitation is mild . No evidence of tricuspid stenosis. Aortic Valve: The aortic valve is tricuspid. Aortic valve regurgitation is not visualized. Aortic valve sclerosis is  present, with no evidence of aortic valve stenosis. Aortic valve mean gradient measures 3.0 mmHg. Aortic valve peak gradient measures 7.0  mmHg. Aortic valve area, by VTI measures 2.55 cm. Pulmonic Valve: The pulmonic valve was normal in structure. Pulmonic valve regurgitation is not visualized. No evidence of pulmonic stenosis. Aorta: The aortic root is normal in size and structure. There is borderline dilatation of the aortic root, measuring 37 mm. Venous: The inferior vena cava is normal in size with greater than 50% respiratory variability, suggesting right atrial pressure of 3 mmHg. IAS/Shunts: No atrial level shunt detected by color flow Doppler.  LEFT VENTRICLE PLAX 2D LVIDd:         4.80 cm      Diastology LVIDs:         3.70 cm      LV e' medial:    4.68 cm/s LV PW:         1.50 cm      LV E/e' medial:  10.3 LV IVS:        1.20 cm      LV e' lateral:   5.22 cm/s LVOT diam:     2.10 cm      LV E/e' lateral: 9.2 LV SV:         66 LV SV Index:   34           2D Longitudinal Strain LVOT Area:     3.46 cm     2D Strain GLS Avg:     -8.9 %  LV Volumes (MOD) LV vol d, MOD A2C: 104.0 ml LV vol d, MOD A4C: 75.5 ml LV vol s, MOD A2C: 60.1 ml LV vol s, MOD A4C: 47.3 ml LV SV MOD A2C:     43.9 ml LV SV MOD A4C:     75.5 ml LV SV MOD BP:      38.4 ml RIGHT VENTRICLE RV Basal diam:  3.50 cm RV Mid diam:    3.50 cm RV S prime:     15.90 cm/s TAPSE (M-mode): 3.2 cm LEFT ATRIUM             Index        RIGHT ATRIUM           Index LA diam:        3.90 cm 2.03 cm/m   RA Area:     17.70 cm LA Vol (A2C):   67.7 ml 35.30 ml/m  RA Volume:   48.10 ml  25.08 ml/m LA Vol (A4C):   32.5 ml 16.94 ml/m LA Biplane Vol: 48.7 ml 25.39 ml/m  AORTIC VALVE  PULMONIC VALVE AV Area (Vmax):    2.60 cm     PV Vmax:       0.98 m/s AV Area (Vmean):   2.54 cm     PV Peak grad:  3.9 mmHg AV Area (VTI):     2.55 cm AV Vmax:           132.00 cm/s AV Vmean:          84.700 cm/s AV VTI:            0.258 m AV Peak Grad:       7.0 mmHg AV Mean Grad:      3.0 mmHg LVOT Vmax:         99.00 cm/s LVOT Vmean:        62.200 cm/s LVOT VTI:          0.190 m LVOT/AV VTI ratio: 0.74  AORTA Ao Root diam: 3.70 cm MITRAL VALVE MV Area (PHT): 4.80 cm     SHUNTS MV Area VTI:   2.32 cm     Systemic VTI:  0.19 m MV Peak grad:  6.9 mmHg     Systemic Diam: 2.10 cm MV Mean grad:  2.0 mmHg MV Vmax:       1.31 m/s MV Vmean:      64.4 cm/s MV Decel Time: 158 msec MV E velocity: 48.20 cm/s MV A velocity: 121.00 cm/s MV E/A ratio:  0.40 Julien Nordmann MD Electronically signed by Julien Nordmann MD Signature Date/Time: 11/03/2022/4:07:01 PM    Final    PERIPHERAL VASCULAR CATHETERIZATION  Result Date: 11/02/2022 See surgical note for result.  MR BRAIN WO CONTRAST  Result Date: 11/02/2022 CLINICAL DATA:  Stroke, follow-up. EXAM: MRI HEAD WITHOUT CONTRAST TECHNIQUE: Multiplanar, multiecho pulse sequences of the brain and surrounding structures were obtained without intravenous contrast. COMPARISON:  Head CT 11/01/2022. FINDINGS: Brain: Punctate subacute infarct in the right cerebellar hemisphere (image 7 series 5). No acute hemorrhage or significant mass effect. Encephalomalacia in the high left frontal lobe from prior ACA-MCA border zone infarct. No hydrocephalus or extra-axial collection. No mass or midline shift. 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 Skull and upper cervical spine: Normal marrow signal. Sinuses/Orbits: Unremarkable. Other: None. IMPRESSION: 1. Punctate subacute infarct in the right cerebellar hemisphere. No acute hemorrhage or significant mass effect. 2. 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 Electronically Signed   By: Orvan Falconer M.D.   On: 11/02/2022 13:28   CT ANGIO AO+BIFEM W & OR WO CONTRAST  Result Date: 11/02/2022 CLINICAL DATA:  Cold right foot, weak pulse. Left foot cold, no  palpable pulse. EXAM: CT ANGIOGRAPHY OF ABDOMINAL AORTA WITH ILIOFEMORAL RUNOFF TECHNIQUE: Multidetector CT imaging of the abdomen, pelvis and lower extremities was performed using the standard protocol during bolus administration of intravenous contrast. Multiplanar CT image reconstructions and MIPs were obtained to evaluate the vascular anatomy. RADIATION DOSE REDUCTION: This exam was performed according to the departmental dose-optimization program which includes automated exposure control, adjustment of the mA and/or kV according to patient size and/or use of iterative reconstruction technique. CONTRAST:  OMNIPAQUE IOHEXOL 350 MG/ML SOLN COMPARISON:  None Available. FINDINGS: VASCULAR Aorta: Extensive atherosclerosis.  No aneurysm. Celiac: Extensive atherosclerosis. Widely patent. Vessel is diffusely ectatic. SMA: Arises from the celiac artery with extensive calcifications. Mild narrowing within the proximal to mid SMA.  Renals: Single renal arteries bilaterally, heavily calcified, suspect moderate left renal artery stenosis. IMA: Patent RIGHT Lower Extremity Inflow: Heavily calcified common, internal and external right iliac artery. Stent seen within the right common carotid artery extending to near the common femoral artery. 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 Common and external iliac arteries patent. Outflow: Heavily calcified disease common femoral artery. There is occlusion of the proximal right SFA. Deep femoral artery heavily disease proximally with likely severe proximal stenosis. Contrast is seen within the vessel presumably related to collaterals. Runoff: Reconstitution of a severely diseased popliteal artery. Severely diseased trifurcation vessels. Dominant runoff appears to be via diseased anterior tibial artery. LEFT Lower Extremity Inflow: Left common iliac, external iliac arteries are occluded. Stent seen within the left external iliac artery. There is brief reconstitution of the left  common femoral artery which is severely diseased and stenotic. Outflow: Occlusion of the left superficial femoral artery and deep femoral artery. Runoff: No visible significant reconstitution in the left calf. Veins: No obvious venous abnormality within the limitations of this arterial phase study. Review of the MIP images confirms the above findings. NON-VASCULAR Lower chest: No acute abnormality. Hepatobiliary: Innumerable subcentimeter low-density lesions throughout the liver, likely small cysts. Gallbladder unremarkable. Pancreas: No focal abnormality or ductal dilatation. Spleen: No focal abnormality.  Normal size. Adrenals/Urinary Tract: Adrenal glands normal. Bilateral renal cysts, the largest in the upper pole on the right measuring 2.7 cm. No follow-up imaging recommended. No hydronephrosis. Urinary bladder grossly unremarkable. Stomach/Bowel: Left colonic diverticulosis. No active diverticulitis. Normal appendix. Stomach and small bowel decompressed, unremarkable. Lymphatic: No adenopathy Reproductive: Prostate enlargement. Other: No free fluid or free air. Musculoskeletal: No acute bony abnormality. 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 Severely diseased iliofemoral vessels on the right. Prior standing in the right common and external iliac arteries. 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 Dominant runoff in the right lower extremity via a diseased anterior tibial artery. 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 Reconstitution of a severely diseased common femoral artery with some flow in a severely diseased deep femoral artery. Left SFA and popliteal arteries occluded. No significant reconstitution in the left calf. NON-VASCULAR Left colonic diverticulosis. No active diverticulitis. No acute findings in the abdomen or pelvis. Electronically Signed   By:  Charlett Nose M.D.   On: 11/02/2022 02:38   US Venous Img Lower Bilateral (DVT)  Result Date: 11/02/2022 CLINICAL DATA:  Bilateral leg pain. EXAM: BILATERAL LOWER EXTREMITY VENOUS DOPPLER ULTRASOUND TECHNIQUE: Gray-scale sonography with graded compression, as well as color Doppler and duplex ultrasound were performed to evaluate the lower extremity deep venous systems from the level of the common femoral vein and including the common femoral, femoral, profunda femoral, popliteal and calf veins including the posterior tibial, peroneal and gastrocnemius veins when visible. The superficial great saphenous vein was also interrogated. Spectral Doppler was utilized to evaluate flow at rest and with distal augmentation maneuvers in the common femoral, femoral and popliteal veins. COMPARISON:  None Available. FINDINGS: RIGHT LOWER EXTREMITY Common Femoral Vein: No evidence of thrombus. Normal compressibility, respiratory phasicity and response to augmentation. Saphenofemoral Junction: No evidence of thrombus. Normal compressibility and flow on color Doppler imaging. Profunda Femoral Vein: No evidence of thrombus. Normal compressibility and flow on color Doppler imaging. Femoral Vein: No evidence of thrombus. Normal compressibility, respiratory phasicity and response to augmentation. Popliteal Vein: No evidence of thrombus. Normal compressibility, respiratory  phasicity and response to augmentation. Calf Veins: No evidence of thrombus. Normal compressibility and flow on color Doppler imaging. Superficial Great Saphenous Vein: No evidence of thrombus. Normal compressibility. Venous Reflux:  None. Other Findings:  None. LEFT LOWER EXTREMITY Common Femoral Vein: No evidence of thrombus. Normal compressibility, respiratory phasicity and response to augmentation. Saphenofemoral Junction: No evidence of thrombus. Normal compressibility and flow on color Doppler imaging. Profunda Femoral Vein: No evidence of thrombus. Normal  compressibility and flow on color Doppler imaging. Femoral Vein: Evidence of nonocclusive thrombus with abnormal compressibility, respiratory phasicity and response to augmentation. Popliteal Vein: Evidence of nonocclusive thrombus with abnormal compressibility, respiratory phasicity and response to augmentation. Calf Veins: No evidence of thrombus. Normal compressibility and flow on color Doppler imaging. Superficial Great Saphenous Vein: No evidence of thrombus. Normal compressibility. Venous Reflux:  None. Other Findings:  None. IMPRESSION: 1. 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 2. No evidence of DVT within the RIGHT lower extremity. Electronically Signed   By: Aram Candela M.D.   On: 11/02/2022 00:55   US Abdomen Limited RUQ (LIVER/GB)  Result Date: 11/02/2022 CLINICAL DATA:  Abnormal liver function test. EXAM: ULTRASOUND ABDOMEN LIMITED RIGHT UPPER QUADRANT COMPARISON:  None Available. FINDINGS: Gallbladder: No gallstones or wall thickening visualized (3.2 mm). No sonographic Murphy sign noted by sonographer. Common bile duct: Diameter: 3.8 mm Liver: Multiple small hepatic cysts are seen. The largest measures approximately 1.0 cm x 0.6 cm x 1.3 cm and is located within the left lobe of the liver. Diffusely increased echogenicity of the liver parenchyma is noted. Portal vein is patent on color Doppler imaging with normal direction of blood flow towards the liver. Other: Multiple simple cysts are seen within the right kidney. The largest measures approximately 3.4 cm x 2.8 cm x 2.8 cm. A small right pleural effusion is noted. IMPRESSION: 1. Hepatic steatosis with multiple hepatic and right renal cysts. 2. Small right pleural effusion. Electronically Signed   By: Aram Candela M.D.   On: 11/02/2022 00:52   CT HEAD CODE STROKE WO CONTRAST  Result Date: 11/01/2022 CLINICAL DATA:  Code stroke. Altered mental status, confusion, left-sided weakness  and numbness EXAM: CT HEAD WITHOUT CONTRAST TECHNIQUE: Contiguous axial images were obtained from the base of the skull through the vertex without intravenous contrast. RADIATION DOSE REDUCTION: This exam was performed according to the departmental dose-optimization program which includes automated exposure control, adjustment of the mA and/or kV according to patient size and/or use of iterative reconstruction technique. COMPARISON:  11/22/2008 FINDINGS: Brain: No evidence of acute infarction, hemorrhage, mass, mass effect, or midline shift. No hydrocephalus or extra-axial collection. 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 Vascular: No hyperdense vessel. Atherosclerotic calcifications in the intracranial carotid and vertebral arteries. Skull: Negative for fracture or focal lesion. Sinuses/Orbits: No acute finding. Other: The mastoid air cells are well aerated. ASPECTS [facility] Stroke Program Early CT Score) - Ganglionic level infarction (caudate, lentiform nuclei, internal capsule, insula, M1-M3 cortex): 7 - Supraganglionic infarction (M4-M6 cortex): 3 Total score (0-10 with 10 being normal): 10 IMPRESSION: 1. No acute intracranial process. 2. ASPECTS is 10 Code stroke imaging results were communicated on 11/01/2022 at 6:58 pm to provider [facility] via telephone, who verbally acknowledged these results. Electronically Signed   By: Wiliam Ke M.D.   On: 11/01/2022 18:59      Subjective: Seen and examined on day  of dc.  Stable, no distress  Discharge Exam: Vitals:   11/21/22 0805 11/21/22 0933  BP: (!) 90/49 97/60  Pulse: 60 100  Resp: (!) 22   Temp: 97.7 F (36.5 C)   SpO2: 99%    Vitals:   11/21/22 0317 11/21/22 0500 11/21/22 0805 11/21/22 0933  BP: 131/69  (!) 90/49 97/60  Pulse: 97  60 100  Resp: 16  (!) 22   Temp: 97.6 F (36.4 C)  97.7 F (36.5 C)   TempSrc:      SpO2: 100%  99%   Weight:   68.4 kg    Height:        General: Pt is alert, awake, not in acute distress Cardiovascular: RRR, S1/S2 +, no rubs, no gallops Respiratory: CTA bilaterally, no wheezing, no rhonchi Abdominal: Soft, NT, ND, bowel sounds + Extremities: no edema, no cyanosis    The results of significant diagnostics from this hospitalization (including imaging, microbiology, ancillary and laboratory) are listed below for reference.     Microbiology: No results found for this or any previous visit (from the past 240 hour(s)).   Labs: BNP (last 3 results) No results for input(s): "BNP" in the last 8760 hours. Basic Metabolic Panel: Recent Labs  Lab 11/16/22 0446 11/19/22 0833  NA 132* 138  K 3.6 4.7  CL 99 106  CO2 22 24  GLUCOSE 103* 106*  BUN 21 22  CREATININE 1.14 1.09  CALCIUM 8.3* 8.8*   Liver Function Tests: Recent Labs  Lab 11/16/22 0446  AST 100*  ALT 108*  ALKPHOS 130*  BILITOT 1.7*  PROT 7.3  ALBUMIN 2.6*   No results for input(s): "LIPASE", "AMYLASE" in the last 168 hours. No results for input(s): "AMMONIA" in the last 168 hours. CBC: Recent Labs  Lab 11/16/22 0446 11/19/22 0833  WBC 14.2* 9.9  NEUTROABS  --  8.2*  HGB 9.8* 10.2*  HCT 30.9* 32.2*  MCV 88.5 89.2  PLT 571* 520*   Cardiac Enzymes: No results for input(s): "CKTOTAL", "CKMB", "CKMBINDEX", "TROPONINI" in the last 168 hours. BNP: Invalid input(s): "POCBNP" CBG: Recent Labs  Lab 11/16/22 2114  GLUCAP 95   D-Dimer No results for input(s): "DDIMER" in the last 72 hours. Hgb A1c No results for input(s): "HGBA1C" in the last 72 hours. Lipid Profile No results for input(s): "CHOL", "HDL", "LDLCALC", "TRIG", "CHOLHDL", "LDLDIRECT" in the last 72 hours. Thyroid function studies No results for input(s): "TSH", "T4TOTAL", "T3FREE", "THYROIDAB" in the last 72 hours.  Invalid input(s): "FREET3" Anemia work up No results for input(s): "VITAMINB12", "FOLATE", "FERRITIN", "TIBC", "IRON", "RETICCTPCT"  in the last 72 hours. Urinalysis    Component Value Date/Time   COLORURINE YELLOW (A) 11/05/2022 1450   APPEARANCEUR CLOUDY (A) 11/05/2022 1450   LABSPEC 1.029 11/05/2022 1450   PHURINE 5.0 11/05/2022 1450   GLUCOSEU NEGATIVE 11/05/2022 1450   HGBUR LARGE (A) 11/05/2022 1450   BILIRUBINUR NEGATIVE 11/05/2022 1450   KETONESUR 5 (A) 11/05/2022 1450   PROTEINUR 100 (A) 11/05/2022 1450   NITRITE NEGATIVE 11/05/2022 1450   LEUKOCYTESUR MODERATE (A) 11/05/2022 1450   Sepsis Labs Recent Labs  Lab 11/16/22 0446 11/19/22 0833  WBC 14.2* 9.9   Microbiology No results found for this or any previous visit (from the past 240 hour(s)).   Time coordinating discharge: Over 30 minutes  SIGNED:   Tresa Moore, MD  [facility] 11/22/2022 Pager   If 7PM-7AM, please contact night-coverage

## 2022-11-21 MED ORDER — POLYETHYLENE GLYCOL 3350 17 G PO PACK: 17.0000 g | PACK | Freq: Two times a day (BID) | ORAL | 0 refills | Status: AC

## 2022-11-21 MED ORDER — APIXABAN 5 MG PO TABS: 5.0000 mg | ORAL_TABLET | Freq: Two times a day (BID) | ORAL | Status: AC

## 2022-11-21 MED ORDER — PANTOPRAZOLE SODIUM 40 MG PO TBEC: 40.0000 mg | DELAYED_RELEASE_TABLET | Freq: Every day | ORAL | Status: AC

## 2022-11-21 MED ORDER — VITAMIN B-1 100 MG PO TABS: 100.0000 mg | ORAL_TABLET | Freq: Every day | ORAL | Status: AC

## 2022-11-21 NOTE — Plan of Care (Signed)
Pt A&O to self. D/C to [facility]. BP 97/60   Pulse 100   Temp 97.7 F (36.5 C)   Resp (!) 22   Ht 5\' 7"  (1.702 m)   Wt 68.4 kg   SpO2 99%   BMI 23.62 kg/m  Report called. IV removed and belongings returned. AVS placed in packet for EMS to bring to facility. Reva Bores 11/21/22 11:52 AM

## 2022-11-21 NOTE — TOC Transition Note (Addendum)
Transition of Care [facility]) - CM/SW Discharge Note   Patient Details  Name: Corey Olson MRN: [id number] Date of Birth: [birth year]  [facility] CM/SW Contact:  Margarito Liner, LCSW Phone Number: 11/21/2022, 11:59 AM   Clinical Narrative:  Patient has orders to [facility]. RN will call report to [phone] (Room 604). EMS transport has been arranged. No further concerns. CSW signing off.   1:28 pm: EMS unable to transport to [place] today. [facility], [place], [place], [place], and [address] [facility] also unable to transport. [facility] will pick patient up at 9:00 am tomorrow morning. SNF admissions coordinator and daughter are aware.  Final next level of care: Skilled Nursing Facility Barriers to Discharge: Barriers Resolved   Patient Goals and CMS Choice      Discharge Placement     Existing PASRR number confirmed : 11/14/22          Patient chooses bed at: [facility] Patient to be transferred to facility by: EMS Name of family member notified: [name] Patient and family notified of of transfer: 11/21/22  Discharge Plan and Services Additional resources added to the After Visit Summary for                                       Social Determinants of Health (SDOH) Interventions SDOH Screenings   Food Insecurity: No Food Insecurity (11/15/2022)  Housing: Low Risk  (11/02/2022)  Transportation Needs: No Transportation Needs (11/15/2022)  Utilities: Not At Risk (11/15/2022)  Tobacco Use: High Risk (11/09/2022)     Readmission Risk Interventions     No data to display

## 2022-11-21 NOTE — Plan of Care (Signed)
Pt A&O to self. IV removed/ tele D/C. Belongings returned to patient and report called to [employer]. BP (!) 105/59 (BP Location: Left Arm)   Pulse 88   Temp 98.2 F (36.8 C)   Resp 20   Ht 5\' 7"  (1.702 m)   Wt 68.4 kg   SpO2 100%   BMI 23.62 kg/m  Reva Bores 11/21/22 12:26 PM

## 2022-11-21 NOTE — Care Management Important Message (Signed)
Important Message  Patient Details  Name: Corey Olson MRN: [id number] Date of Birth: 1955-11-20   Medicare Important Message Given:  Yes     [name] 11/21/2022, 12:15 PM

## 2022-11-22 NOTE — TOC Transition Note (Signed)
Transition of Care [facility]) - CM/SW Discharge Note   Patient Details  Name: Corey Olson MRN: [id number] Date of Birth: 1956/01/28  Transition of Care [facility]) CM/SW Contact:  Margarito Liner, LCSW Phone Number: 11/22/2022, 8:17 AM   Clinical Narrative:  Patient has orders to discharge to [facility]. CSW faxed updated discharge summary to facility. RN does not need to call report again. EMS confirmed they will pick him up around 9:00. No further concerns. CSW signing off.  Final next level of care: Skilled Nursing Facility Barriers to Discharge: Barriers Resolved   Patient Goals and CMS Choice   Choice offered to / list presented to : Adult Children  Discharge Placement     Existing PASRR number confirmed : 11/14/22          Patient [facility] bed at: [facility] Patient to be transferred to facility by: EMS Name of family member notified: [name] Patient and family notified of of transfer: 11/21/22  Discharge Plan and Services Additional resources added to the After Visit Summary for                                       Social Determinants of Health (SDOH) Interventions SDOH Screenings   Food Insecurity: No Food Insecurity (11/15/2022)  Housing: Low Risk  (11/02/2022)  Transportation Needs: No Transportation Needs (11/15/2022)  Utilities: Not At Risk (11/15/2022)  Tobacco Use: High Risk (11/09/2022)     Readmission Risk Interventions     No data to display

## 2022-11-22 NOTE — Plan of Care (Signed)
°  Problem: Education: °Goal: Knowledge of General Education information will improve °Description: Including pain rating scale, medication(s)/side effects and non-pharmacologic comfort measures °Outcome: Progressing °  °Problem: Clinical Measurements: °Goal: Ability to maintain clinical measurements within normal limits will improve °Outcome: Progressing °Goal: Will remain free from infection °Outcome: Progressing °Goal: Diagnostic test results will improve °Outcome: Progressing °Goal: Respiratory complications will improve °Outcome: Progressing °Goal: Cardiovascular complication will be avoided °Outcome: Progressing °  °Problem: Activity: °Goal: Risk for activity intolerance will decrease °Outcome: Progressing °  °Problem: Elimination: °Goal: Will not experience complications related to bowel motility °Outcome: Progressing °Goal: Will not experience complications related to urinary retention °Outcome: Progressing °  °Problem: Pain Managment: °Goal: General experience of comfort will improve °Outcome: Progressing °  °Problem: Skin Integrity: °Goal: Risk for impaired skin integrity will decrease °Outcome: Progressing °  °

## 2022-12-26 ENCOUNTER — Other Ambulatory Visit (INDEPENDENT_AMBULATORY_CARE_PROVIDER_SITE_OTHER): Payer: Self-pay | Admitting: Nurse Practitioner

## 2022-12-26 DIAGNOSIS — I739 Peripheral vascular disease, unspecified: Secondary | ICD-10-CM

## 2022-12-26 DIAGNOSIS — L819 Disorder of pigmentation, unspecified: Secondary | ICD-10-CM

## 2022-12-28 ENCOUNTER — Encounter (INDEPENDENT_AMBULATORY_CARE_PROVIDER_SITE_OTHER): Payer: Medicare Other

## 2023-01-05 ENCOUNTER — Ambulatory Visit (INDEPENDENT_AMBULATORY_CARE_PROVIDER_SITE_OTHER): Payer: Medicare Other | Admitting: Vascular Surgery

## 2023-01-05 ENCOUNTER — Encounter: Payer: Medicare Other | Attending: Physician Assistant | Admitting: Physician Assistant

## 2023-01-05 DIAGNOSIS — J449 Chronic obstructive pulmonary disease, unspecified: Secondary | ICD-10-CM | POA: Diagnosis not present

## 2023-01-05 DIAGNOSIS — L89610 Pressure ulcer of right heel, unstageable: Secondary | ICD-10-CM | POA: Diagnosis not present

## 2023-01-05 DIAGNOSIS — I739 Peripheral vascular disease, unspecified: Secondary | ICD-10-CM | POA: Insufficient documentation

## 2023-01-05 DIAGNOSIS — I10 Essential (primary) hypertension: Secondary | ICD-10-CM | POA: Diagnosis not present

## 2023-01-05 DIAGNOSIS — L89620 Pressure ulcer of left heel, unstageable: Secondary | ICD-10-CM | POA: Insufficient documentation

## 2023-01-05 DIAGNOSIS — I2510 Atherosclerotic heart disease of native coronary artery without angina pectoris: Secondary | ICD-10-CM | POA: Insufficient documentation

## 2023-01-09 ENCOUNTER — Ambulatory Visit (INDEPENDENT_AMBULATORY_CARE_PROVIDER_SITE_OTHER): Payer: Medicare Other

## 2023-01-09 ENCOUNTER — Other Ambulatory Visit (INDEPENDENT_AMBULATORY_CARE_PROVIDER_SITE_OTHER): Payer: Self-pay | Admitting: Nurse Practitioner

## 2023-01-09 ENCOUNTER — Encounter (INDEPENDENT_AMBULATORY_CARE_PROVIDER_SITE_OTHER): Payer: Self-pay | Admitting: Nurse Practitioner

## 2023-01-09 ENCOUNTER — Ambulatory Visit (INDEPENDENT_AMBULATORY_CARE_PROVIDER_SITE_OTHER): Payer: Medicare Other | Admitting: Nurse Practitioner

## 2023-01-09 VITALS — BP 115/72 | HR 72 | Resp 16 | Ht 67.0 in | Wt 150.0 lb

## 2023-01-09 DIAGNOSIS — I739 Peripheral vascular disease, unspecified: Secondary | ICD-10-CM | POA: Diagnosis not present

## 2023-01-09 DIAGNOSIS — Z9889 Other specified postprocedural states: Secondary | ICD-10-CM | POA: Diagnosis not present

## 2023-01-09 DIAGNOSIS — S91309A Unspecified open wound, unspecified foot, initial encounter: Secondary | ICD-10-CM

## 2023-01-09 DIAGNOSIS — F17200 Nicotine dependence, unspecified, uncomplicated: Secondary | ICD-10-CM

## 2023-01-09 DIAGNOSIS — L819 Disorder of pigmentation, unspecified: Secondary | ICD-10-CM

## 2023-01-09 DIAGNOSIS — I7025 Atherosclerosis of native arteries of other extremities with ulceration: Secondary | ICD-10-CM

## 2023-01-09 DIAGNOSIS — E782 Mixed hyperlipidemia: Secondary | ICD-10-CM

## 2023-01-09 DIAGNOSIS — I10 Essential (primary) hypertension: Secondary | ICD-10-CM

## 2023-01-09 LAB — VAS US ABI WITH/WO TBI
Left ABI: 0.52
Right ABI: 0.47

## 2023-01-09 NOTE — H&P (View-Only) (Signed)
 Subjective:    Patient ID: Corey Olson, male    DOB: July 25, 1955, 67 y.o.   MRN: [id number] Chief Complaint  Patient presents with   Follow-up    patient has a black spot on heel of foot and is unable to walk on it... wants to come in to be seen to check it out to see if vascular realated    The patient returns to the office for followup and review status post angiogram with intervention on 11/04/2022.   Procedure: PROCEDURE: 1.   Left common femoral, profunda femoris, and superficial femoral artery endarterectomies 2.   Right common femoral, profunda femoris, and superficial femoral artery endarterectomies 3.   4 Fogarty embolectomy balloon in the left iliac arteries for thrombus 4.   3 Fogarty embolectomy balloon in the left profunda femoris artery distally for thrombus 5.   Angioplasty of the left profunda femoris artery distal to the endarterectomy with 4 and 5 mm diameter Lutonix drug-coated angioplasty balloons 6.   Stent placement to the left external iliac artery with 8 mm diameter by 5 cm length Viabahn stent 7.   Stent placement to the right external iliac artery with 8 mm diameter by 5 cm length Viabahn stent    The patient subsequently had complication of hematoma which required evacuation during this hospitalization.  He subsequently went to rehab.  He has been home for approximately a month or so.  It was noted recently of the bilateral heel wounds.  Much of the history today is given by his family member.  They have been cleaning the wound with Betadine and gauze.  The left heel is worse than the right.  She notes that since he has been on has been taking his medication as prescribed including the Eliquis and aspirin.  Today his bilateral groin wounds are completely healed.  Today the patient has a right ABI of 0.47 and a left of 0.52.  There is a TBI 0.26 on the right and is absent on the left.  Additional duplex findings show monophasic waveforms in the bilateral femoral  arteries with occlusion of the bilateral SFAs with dampened monophasic flow in the bilateral tibial vessels.     Review of Systems  Musculoskeletal:  Positive for gait problem.  Skin:  Positive for wound.  All other systems reviewed and are negative.      Objective:   Physical Exam Vitals reviewed.  HENT:     Head: Normocephalic.  Cardiovascular:     Rate and Rhythm: Normal rate.  Pulmonary:     Effort: Pulmonary effort is normal.  Musculoskeletal:       Feet:  Skin:    General: Skin is warm and dry.  Neurological:     Mental Status: He is alert and oriented to person, place, and time.  Psychiatric:        Mood and Affect: Mood normal.        Behavior: Behavior normal.        Thought Content: Thought content normal.        Judgment: Judgment normal.     BP 115/72 (BP Location: Left Arm)   Pulse 72   Resp 16   Ht 5\' 7"  (1.702 m)   Wt 150 lb (68 kg)   BMI 23.49 kg/m   Past Medical History:  Diagnosis Date   Alcohol use disorder, mild, abuse    COPD (chronic obstructive pulmonary disease) (HCC)    Gout    Hyperlipidemia  Hypertension    Hypothyroid    Peripheral vascular complication    Stroke [facility])     Social History   Socioeconomic History   Marital status: Single    Spouse name: Not on file   Number of children: Not on file   Years of education: Not on file   Highest education level: Not on file  Occupational History   Not on file  Tobacco Use   Smoking status: Every Day    Current packs/day: 1.00    Average packs/day: 1 pack/day for 50.0 years (50.0 ttl pk-yrs)    Types: Cigarettes   Smokeless tobacco: Never  Substance and Sexual Activity   Alcohol use: Yes    Comment: social   Drug use: Never   Sexual activity: Not on file  Other Topics Concern   Not on file  Social History Narrative   Not on file   Social Determinants of Health   Financial Resource Strain: Not on file  Food Insecurity: No Food Insecurity (11/15/2022)   Hunger  Vital Sign    Worried About Running Out of Food in the Last Year: Never true    Ran Out of Food in the Last Year: Never true  Transportation Needs: No Transportation Needs (11/15/2022)   PRAPARE - Administrator, Civil Service (Medical): No    Lack of Transportation (Non-Medical): No  Physical Activity: Not on file  Stress: Not on file  Social Connections: Not on file  Intimate Partner Violence: Not At Risk (11/15/2022)   Humiliation, Afraid, Rape, and Kick questionnaire    Fear of Current or Ex-Partner: No    Emotionally Abused: No    Physically Abused: No    Sexually Abused: No    Past Surgical History:  Procedure Laterality Date   ENDARTERECTOMY FEMORAL Bilateral 11/04/2022   Procedure: ENDARTERECTOMY FEMORAL;  Surgeon: Annice Needy, MD;  Location: [facility] ORS;  Service: Vascular;  Laterality: Bilateral;   HEMATOMA EVACUATION Left 11/06/2022   Procedure: EVACUATION HEMATOMA;  Surgeon: Leonie Douglas, MD;  Location: [facility] ORS;  Service: Vascular;  Laterality: Left;   INSERTION OF ILIAC STENT Bilateral 11/04/2022   Procedure: INSERTION OF ILIAC STENT;  Surgeon: Annice Needy, MD;  Location: [facility] ORS;  Service: Vascular;  Laterality: Bilateral;   LOWER EXTREMITY ANGIOGRAPHY Right 08/25/2020   Procedure: LOWER EXTREMITY ANGIOGRAPHY;  Surgeon: Renford Dills, MD;  Location: [facility];  Service: Cardiovascular;  Laterality: Right;   LOWER EXTREMITY ANGIOGRAPHY Left 06/15/2021   Procedure: LOWER EXTREMITY ANGIOGRAPHY;  Surgeon: Renford Dills, MD;  Location: [facility];  Service: Cardiovascular;  Laterality: Left;   LOWER EXTREMITY ANGIOGRAPHY Left 11/02/2022   Procedure: Lower Extremity Angiography;  Surgeon: Annice Needy, MD;  Location: [facility];  Service: Cardiovascular;  Laterality: Left;    Family History  Problem Relation Age of Onset   Heart attack Mother    Heart Problems Father    Heart attack Father     No Known Allergies      Latest Ref Rng & Units 11/19/2022    8:33 AM 11/16/2022    4:46 AM 11/13/2022    5:57 AM  CBC  WBC 4.0 - 10.5 K/uL 9.9  14.2  17.8   Hemoglobin 13.0 - 17.0 g/dL 16.1  9.8  09.6   Hematocrit 39.0 - 52.0 % 32.2  30.9  31.8   Platelets 150 - 400 K/uL 520  571  526  CMP     Component Value Date/Time   NA 138 11/19/2022 0833   K 4.7 11/19/2022 0833   CL 106 11/19/2022 0833   CO2 24 11/19/2022 0833   GLUCOSE 106 (H) 11/19/2022 0833   BUN 22 11/19/2022 0833   CREATININE 1.09 11/19/2022 0833   CALCIUM 8.8 (L) 11/19/2022 0833   PROT 7.3 11/16/2022 0446   ALBUMIN 2.6 (L) 11/16/2022 0446   AST 100 (H) 11/16/2022 0446   ALT 108 (H) 11/16/2022 0446   ALKPHOS 130 (H) 11/16/2022 0446   BILITOT 1.7 (H) 11/16/2022 0446   GFRNONAA >60 11/19/2022 0833     VAS [place] ABI WITH/WO TBI  Result Date: 01/09/2023  LOWER EXTREMITY DOPPLER STUDY Patient Name:  Corey Olson  Date of Exam:   01/09/2023 Medical Rec #: [id number]     Accession #:    [id number] Date of Birth: 08/21/1955      Patient Gender: M Patient Age:   64 years Exam Location:  [facility] Procedure:      VAS [place] ABI WITH/WO TBI Referring Phys: Sheppard Plumber --------------------------------------------------------------------------------  Indications: Peripheral artery disease.  Vascular Interventions: 11/04/2022 4 Fogarty embolectomy balloon in the left                         iliac arteries for thrombus                         4. 3 Fogarty embolectomy balloon in the left profunda                         femoris artery distally for thrombus                         5. Angioplasty of the left profunda femoris artery                         distal to the endarterectomy with 4 and 5 mm diameter                         Lutonix drug-coated angioplasty balloons                         6. Stent placement to the left external iliac artery                         with 8 mm diameter by 5 cm length Viabahn stent                         7. Stent  placement to the right external iliac artery                         with 8 mm diameter by 5 cm length Viabahn stent. Comparison Study: 05/2022 Performing Technologist: Salvadore Farber RVT  Examination Guidelines: A complete evaluation includes at minimum, Doppler waveform signals and systolic blood pressure reading at the level of bilateral brachial, anterior tibial, and posterior tibial arteries, when vessel segments are accessible. Bilateral testing is considered an integral part of a complete examination. Photoelectric Plethysmograph (PPG) waveforms and toe systolic pressure readings are included as required and additional duplex testing as needed. Limited examinations for  reoccurring indications may be performed as noted.  ABI Findings: +---------+------------------+-----+-------------------+--------+ Right    Rt Pressure (mmHg)IndexWaveform           Comment  +---------+------------------+-----+-------------------+--------+ ATA      68                0.47 dampened monophasic         +---------+------------------+-----+-------------------+--------+ PTA      68                0.47 dampened monophasic         +---------+------------------+-----+-------------------+--------+ Great Toe38                0.26 Abnormal                    +---------+------------------+-----+-------------------+--------+ +---------+------------------+-----+-------------------+-------+ Left     Lt Pressure (mmHg)IndexWaveform           Comment +---------+------------------+-----+-------------------+-------+ Brachial 145                                               +---------+------------------+-----+-------------------+-------+ ATA      76                0.52 dampened monophasic        +---------+------------------+-----+-------------------+-------+ PTA      90                0.62 dampened monophasic        +---------+------------------+-----+-------------------+-------+ Great Toe                        Absent                     +---------+------------------+-----+-------------------+-------+ +-------+-----------+-----------+------------+------------+ ABI/TBIToday's ABIToday's TBIPrevious ABIPrevious TBI +-------+-----------+-----------+------------+------------+ Right  .47        .26        .36         .26          +-------+-----------+-----------+------------+------------+ Left   .52        absent     .59         .47          +-------+-----------+-----------+------------+------------+ Bilateral ABIs and TBIs appear essentially unchanged compared to prior study on 04/2021.  Summary: Right: Resting right ankle-brachial index indicates severe right lower extremity arterial disease. The right toe-brachial index is abnormal. Duplex findings: Rt CFA monophasic, SFA occluded, Pop monophasic, PTA low monophasic, ATA occluded, peroneal low monophasic. Left: Resting left ankle-brachial index indicates moderate left lower extremity arterial disease. The left toe-brachial index is abnormal. Lt CFA strong monophasic, SFA occlusion, Pop monophasic, PTA monophasic, ATA minimal flow, peroneal mono. *See table(s) above for measurements and observations.  Electronically signed by [name] MD on 01/09/2023 at 5:46:41 PM.    Final        Assessment & Plan:   1. Atherosclerosis of native arteries of the extremities with ulceration (HCC)  Recommend:  The patient has evidence of severe atherosclerotic changes of both lower extremities associated with ulceration and tissue loss of the right and left foot.  This represents a limb threatening ischemia and places the patient at the risk for bilateral limb loss.  Patient should undergo angiography of the left followed by right lower extremity with the hope for intervention for limb salvage.  The risks  and benefits as well as the alternative therapies was discussed in detail with the patient.  All questions were answered.  Patient agrees to  proceed with angiography.  The patient will follow up with me in the office after the procedure.   2. Essential hypertension Continue antihypertensive medications as already ordered, these medications have been reviewed and there are no changes at this time.  3. Mixed hyperlipidemia Continue statin as ordered and reviewed, no changes at this time  4. Smoking Smoking cessation was discussed, 3-10 minutes spent on this topic specifically    5. Non-healing open wound of heel, unspecified laterality, initial encounter In addition to revascularization as noted above, the patient will need treatment for these heel ulcerations.  I have described to the patient's family member that heel wounds are exceptionally hard to heal.  Even with revascularization we are looking an uphill battle for healing.  I have advised that they reach out to his podiatrist in order to establish care for treatment of the ongoing wounds  Current Outpatient Medications on File Prior to Visit  Medication Sig Dispense Refill   acetaminophen (TYLENOL) 500 MG tablet Take 500 mg by mouth every 6 (six) hours as needed for moderate pain or mild pain (lEG PAIN).     albuterol (VENTOLIN HFA) 108 (90 Base) MCG/ACT inhaler Inhale 2 puffs into the lungs every 6 (six) hours as needed for wheezing or shortness of breath.     allopurinol (ZYLOPRIM) 300 MG tablet Take 300 mg by mouth daily.     apixaban (ELIQUIS) 5 MG TABS tablet Take 1 tablet (5 mg total) by mouth 2 (two) times daily. 60 tablet    aspirin 81 MG EC tablet Take 325 mg by mouth daily.     atorvastatin (LIPITOR) 80 MG tablet Take 80 mg by mouth daily.     budesonide-formoterol (SYMBICORT) 160-4.5 MCG/ACT inhaler Inhale 2 puffs into the lungs daily.     gabapentin (NEURONTIN) 100 MG capsule Take 100 mg by mouth 2 (two) times daily.     lisinopril (ZESTRIL) 20 MG tablet Take 20 mg by mouth daily.     metoprolol succinate (TOPROL-XL) 50 MG 24 hr tablet Take 50 mg by mouth  daily.     pantoprazole (PROTONIX) 40 MG tablet Take 1 tablet (40 mg total) by mouth daily.     polyethylene glycol (MIRALAX / GLYCOLAX) 17 g packet Take 17 g by mouth 2 (two) times daily. 14 each 0   thiamine (VITAMIN B-1) 100 MG tablet Take 1 tablet (100 mg total) by mouth daily.     No current facility-administered medications on file prior to visit.    There are no Patient Instructions on file for this visit. No follow-ups on file.   Georgiana Spinner, NP

## 2023-01-09 NOTE — Progress Notes (Incomplete)
Subjective:    Patient ID: Corey Olson, male    DOB: September 17, 1955, 67 y.o.   MRN: [id number] Chief Complaint  Patient presents with  . Follow-up    patient has a black spot on heel of foot and is unable to walk on it... wants to come in to be seen to check it out to see if vascular realated    HPI  Review of Systems  Musculoskeletal:  Positive for gait problem.  Skin:  Positive for wound.  All other systems reviewed and are negative.      Objective:   Physical Exam Vitals reviewed.  HENT:     Head: Normocephalic.  Cardiovascular:     Rate and Rhythm: Normal rate.  Pulmonary:     Effort: Pulmonary effort is normal.  Musculoskeletal:       Feet:  Skin:    General: Skin is warm and dry.  Neurological:     Mental Status: He is alert and oriented to person, place, and time.  Psychiatric:        Mood and Affect: Mood normal.        Behavior: Behavior normal.        Thought Content: Thought content normal.        Judgment: Judgment normal.     BP 115/72 (BP Location: Left Arm)   Pulse 72   Resp 16   Ht 5\' 7"  (1.702 m)   Wt 150 lb (68 kg)   BMI 23.49 kg/m   Past Medical History:  Diagnosis Date  . Alcohol use disorder, mild, abuse   . COPD (chronic obstructive pulmonary disease) (HCC)   . Gout   . Hyperlipidemia   . Hypertension   . Hypothyroid   . Peripheral vascular complication   . Stroke [facility])     Social History   Socioeconomic History  . Marital status: Single    Spouse name: Not on file  . Number of children: Not on file  . Years of education: Not on file  . Highest education level: Not on file  Occupational History  . Not on file  Tobacco Use  . Smoking status: Every Day    Current packs/day: 1.00    Average packs/day: 1 pack/day for 50.0 years (50.0 ttl pk-yrs)    Types: Cigarettes  . Smokeless tobacco: Never  Substance and Sexual Activity  . Alcohol use: Yes    Comment: social  . Drug use: Never  . Sexual activity: Not on file  Other  Topics Concern  . Not on file  Social History Narrative  . Not on file   Social Determinants of Health   Financial Resource Strain: Not on file  Food Insecurity: No Food Insecurity (11/15/2022)   Hunger Vital Sign   . Worried About Programme researcher, broadcasting/film/video in the Last Year: Never true   . Ran Out of Food in the Last Year: Never true  Transportation Needs: No Transportation Needs (11/15/2022)   PRAPARE - Transportation   . Lack of Transportation (Medical): No   . Lack of Transportation (Non-Medical): No  Physical Activity: Not on file  Stress: Not on file  Social Connections: Not on file  Intimate Partner Violence: Not At Risk (11/15/2022)   Humiliation, Afraid, Rape, and Kick questionnaire   . Fear of Current or Ex-Partner: No   . Emotionally Abused: No   . Physically Abused: No   . Sexually Abused: No    Past Surgical History:  Procedure Laterality Date  .  ENDARTERECTOMY FEMORAL Bilateral 11/04/2022   Procedure: ENDARTERECTOMY FEMORAL;  Surgeon: Annice Needy, MD;  Location: [facility];  Service: Vascular;  Laterality: Bilateral;  . HEMATOMA EVACUATION Left 11/06/2022   Procedure: EVACUATION HEMATOMA;  Surgeon: Leonie Douglas, MD;  Location: [facility];  Service: Vascular;  Laterality: Left;  . INSERTION OF ILIAC STENT Bilateral 11/04/2022   Procedure: INSERTION OF ILIAC STENT;  Surgeon: Annice Needy, MD;  Location: [facility];  Service: Vascular;  Laterality: Bilateral;  . LOWER EXTREMITY ANGIOGRAPHY Right 08/25/2020   Procedure: LOWER EXTREMITY ANGIOGRAPHY;  Surgeon: Renford Dills, MD;  Location: [facility];  Service: Cardiovascular;  Laterality: Right;  . LOWER EXTREMITY ANGIOGRAPHY Left 06/15/2021   Procedure: LOWER EXTREMITY ANGIOGRAPHY;  Surgeon: Renford Dills, MD;  Location: [facility];  Service: Cardiovascular;  Laterality: Left;  . LOWER EXTREMITY ANGIOGRAPHY Left 11/02/2022   Procedure: Lower Extremity Angiography;  Surgeon: Annice Needy, MD;  Location:  [facility];  Service: Cardiovascular;  Laterality: Left;    Family History  Problem Relation Age of Onset  . Heart attack Mother   . Heart Problems Father   . Heart attack Father     No Known Allergies     Latest Ref Rng & Units 11/19/2022    8:33 AM 11/16/2022    4:46 AM 11/13/2022    5:57 AM  CBC  WBC 4.0 - 10.5 K/uL 9.9  14.2  17.8   Hemoglobin 13.0 - 17.0 g/dL 10.2  9.8  72.5   Hematocrit 39.0 - 52.0 % 32.2  30.9  31.8   Platelets 150 - 400 K/uL 520  571  526       CMP     Component Value Date/Time   NA 138 11/19/2022 0833   K 4.7 11/19/2022 0833   CL 106 11/19/2022 0833   CO2 24 11/19/2022 0833   GLUCOSE 106 (H) 11/19/2022 0833   BUN 22 11/19/2022 0833   CREATININE 1.09 11/19/2022 0833   CALCIUM 8.8 (L) 11/19/2022 0833   PROT 7.3 11/16/2022 0446   ALBUMIN 2.6 (L) 11/16/2022 0446   AST 100 (H) 11/16/2022 0446   ALT 108 (H) 11/16/2022 0446   ALKPHOS 130 (H) 11/16/2022 0446   BILITOT 1.7 (H) 11/16/2022 0446   GFRNONAA >60 11/19/2022 0833     VAS [place] ABI WITH/WO TBI  Result Date: 01/09/2023  LOWER EXTREMITY DOPPLER STUDY Patient Name:  Corey Olson  Date of Exam:   01/09/2023 Medical Rec #: [id number]     Accession #:    [id number] Date of Birth: Jun 21, 1955      Patient Gender: M Patient Age:   53 years Exam Location:  [facility] Procedure:      VAS [place] ABI WITH/WO TBI Referring Phys: Sheppard Plumber --------------------------------------------------------------------------------  Indications: Peripheral artery disease.  Vascular Interventions: 11/04/2022 4 Fogarty embolectomy balloon in the left                         iliac arteries for thrombus                         4. 3 Fogarty embolectomy balloon in the left profunda                         femoris artery distally for thrombus  5. Angioplasty of the left profunda femoris artery                         distal to the endarterectomy with 4 and 5 mm diameter                          Lutonix drug-coated angioplasty balloons                         6. Stent placement to the left external iliac artery                         with 8 mm diameter by 5 cm length Viabahn stent                         7. Stent placement to the right external iliac artery                         with 8 mm diameter by 5 cm length Viabahn stent. Comparison Study: 05/2022 Performing Technologist: Salvadore Farber RVT  Examination Guidelines: A complete evaluation includes at minimum, Doppler waveform signals and systolic blood pressure reading at the level of bilateral brachial, anterior tibial, and posterior tibial arteries, when vessel segments are accessible. Bilateral testing is considered an integral part of a complete examination. Photoelectric Plethysmograph (PPG) waveforms and toe systolic pressure readings are included as required and additional duplex testing as needed. Limited examinations for reoccurring indications may be performed as noted.  ABI Findings: +---------+------------------+-----+-------------------+--------+ Right    Rt Pressure (mmHg)IndexWaveform           Comment  +---------+------------------+-----+-------------------+--------+ ATA      68                0.47 dampened monophasic         +---------+------------------+-----+-------------------+--------+ PTA      68                0.47 dampened monophasic         +---------+------------------+-----+-------------------+--------+ Great Toe38                0.26 Abnormal                    +---------+------------------+-----+-------------------+--------+ +---------+------------------+-----+-------------------+-------+ Left     Lt Pressure (mmHg)IndexWaveform           Comment +---------+------------------+-----+-------------------+-------+ Brachial 145                                               +---------+------------------+-----+-------------------+-------+ ATA      76                0.52 dampened monophasic         +---------+------------------+-----+-------------------+-------+ PTA      90                0.62 dampened monophasic        +---------+------------------+-----+-------------------+-------+ Great Toe                       Absent                     +---------+------------------+-----+-------------------+-------+ +-------+-----------+-----------+------------+------------+  ABI/TBIToday's ABIToday's TBIPrevious ABIPrevious TBI +-------+-----------+-----------+------------+------------+ Right  .47        .26        .36         .26          +-------+-----------+-----------+------------+------------+ Left   .52        absent     .59         .47          +-------+-----------+-----------+------------+------------+ Bilateral ABIs and TBIs appear essentially unchanged compared to prior study on 04/2021.  Summary: Right: Resting right ankle-brachial index indicates severe right lower extremity arterial disease. The right toe-brachial index is abnormal. Duplex findings: Rt CFA monophasic, SFA occluded, Pop monophasic, PTA low monophasic, ATA occluded, peroneal low monophasic. Left: Resting left ankle-brachial index indicates moderate left lower extremity arterial disease. The left toe-brachial index is abnormal. Lt CFA strong monophasic, SFA occlusion, Pop monophasic, PTA monophasic, ATA minimal flow, peroneal mono. *See table(s) above for measurements and observations.  Electronically signed by [name] MD on 01/09/2023 at 5:46:41 PM.    Final        Assessment & Plan:   1. Atherosclerosis of native arteries of the extremities with ulceration (HCC)  Recommend:  The patient has evidence of severe atherosclerotic changes of both lower extremities associated with ulceration and tissue loss of the right and left foot.  This represents a limb threatening ischemia and places the patient at the risk for bilateral limb loss.  Patient should undergo angiography of the left followed by  right lower extremity with the hope for intervention for limb salvage.  The risks and benefits as well as the alternative therapies was discussed in detail with the patient.  All questions were answered.  Patient agrees to proceed with angiography.  The patient will follow up with me in the office after the procedure.   2. Essential hypertension Continue antihypertensive medications as already ordered, these medications have been reviewed and there are no changes at this time.  3. Mixed hyperlipidemia Continue statin as ordered and reviewed, no changes at this time  4. Smoking Smoking cessation was discussed, 3-10 minutes spent on this topic specifically    5. Non-healing open wound of heel, unspecified laterality, initial encounter In addition to revascularization as noted above, the patient will need treatment for these heel ulcerations.  I have described to the patient's family member that heel wounds are exceptionally hard to heal.  Even with revascularization we are looking an uphill battle for healing.  I have advised that they reach out to his podiatrist in order to establish care for treatment of the ongoing wounds  Current Outpatient Medications on File Prior to Visit  Medication Sig Dispense Refill  . acetaminophen (TYLENOL) 500 MG tablet Take 500 mg by mouth every 6 (six) hours as needed for moderate pain or mild pain (lEG PAIN).    Marland Kitchen albuterol (VENTOLIN HFA) 108 (90 Base) MCG/ACT inhaler Inhale 2 puffs into the lungs every 6 (six) hours as needed for wheezing or shortness of breath.    . allopurinol (ZYLOPRIM) 300 MG tablet Take 300 mg by mouth daily.    Marland Kitchen apixaban (ELIQUIS) 5 MG TABS tablet Take 1 tablet (5 mg total) by mouth 2 (two) times daily. 60 tablet   . aspirin 81 MG EC tablet Take 325 mg by mouth daily.    Marland Kitchen atorvastatin (LIPITOR) 80 MG tablet Take 80 mg by mouth daily.    . budesonide-formoterol (SYMBICORT) 160-4.5 MCG/ACT inhaler  Inhale 2 puffs into the lungs daily.     Marland Kitchen gabapentin (NEURONTIN) 100 MG capsule Take 100 mg by mouth 2 (two) times daily.    Marland Kitchen lisinopril (ZESTRIL) 20 MG tablet Take 20 mg by mouth daily.    . metoprolol succinate (TOPROL-XL) 50 MG 24 hr tablet Take 50 mg by mouth daily.    . pantoprazole (PROTONIX) 40 MG tablet Take 1 tablet (40 mg total) by mouth daily.    . polyethylene glycol (MIRALAX / GLYCOLAX) 17 g packet Take 17 g by mouth 2 (two) times daily. 14 each 0  . thiamine (VITAMIN B-1) 100 MG tablet Take 1 tablet (100 mg total) by mouth daily.     No current facility-administered medications on file prior to visit.    There are no Patient Instructions on file for this visit. No follow-ups on file.   Georgiana Spinner, NP

## 2023-01-09 NOTE — Progress Notes (Signed)
Subjective:    Patient ID: Corey Olson, male    DOB: July 25, 1955, 67 y.o.   MRN: [id number] Chief Complaint  Patient presents with   Follow-up    patient has a black spot on heel of foot and is unable to walk on it... wants to come in to be seen to check it out to see if vascular realated    The patient returns to the office for followup and review status post angiogram with intervention on 11/04/2022.   Procedure: PROCEDURE: 1.   Left common femoral, profunda femoris, and superficial femoral artery endarterectomies 2.   Right common femoral, profunda femoris, and superficial femoral artery endarterectomies 3.   4 Fogarty embolectomy balloon in the left iliac arteries for thrombus 4.   3 Fogarty embolectomy balloon in the left profunda femoris artery distally for thrombus 5.   Angioplasty of the left profunda femoris artery distal to the endarterectomy with 4 and 5 mm diameter Lutonix drug-coated angioplasty balloons 6.   Stent placement to the left external iliac artery with 8 mm diameter by 5 cm length Viabahn stent 7.   Stent placement to the right external iliac artery with 8 mm diameter by 5 cm length Viabahn stent    The patient subsequently had complication of hematoma which required evacuation during this hospitalization.  He subsequently went to rehab.  He has been home for approximately a month or so.  It was noted recently of the bilateral heel wounds.  Much of the history today is given by his family member.  They have been cleaning the wound with Betadine and gauze.  The left heel is worse than the right.  She notes that since he has been on has been taking his medication as prescribed including the Eliquis and aspirin.  Today his bilateral groin wounds are completely healed.  Today the patient has a right ABI of 0.47 and a left of 0.52.  There is a TBI 0.26 on the right and is absent on the left.  Additional duplex findings show monophasic waveforms in the bilateral femoral  arteries with occlusion of the bilateral SFAs with dampened monophasic flow in the bilateral tibial vessels.     Review of Systems  Musculoskeletal:  Positive for gait problem.  Skin:  Positive for wound.  All other systems reviewed and are negative.      Objective:   Physical Exam Vitals reviewed.  HENT:     Head: Normocephalic.  Cardiovascular:     Rate and Rhythm: Normal rate.  Pulmonary:     Effort: Pulmonary effort is normal.  Musculoskeletal:       Feet:  Skin:    General: Skin is warm and dry.  Neurological:     Mental Status: He is alert and oriented to person, place, and time.  Psychiatric:        Mood and Affect: Mood normal.        Behavior: Behavior normal.        Thought Content: Thought content normal.        Judgment: Judgment normal.     BP 115/72 (BP Location: Left Arm)   Pulse 72   Resp 16   Ht 5\' 7"  (1.702 m)   Wt 150 lb (68 kg)   BMI 23.49 kg/m   Past Medical History:  Diagnosis Date   Alcohol use disorder, mild, abuse    COPD (chronic obstructive pulmonary disease) (HCC)    Gout    Hyperlipidemia  Hypertension    Hypothyroid    Peripheral vascular complication    Stroke [facility])     Social History   Socioeconomic History   Marital status: Single    Spouse name: Not on file   Number of children: Not on file   Years of education: Not on file   Highest education level: Not on file  Occupational History   Not on file  Tobacco Use   Smoking status: Every Day    Current packs/day: 1.00    Average packs/day: 1 pack/day for 50.0 years (50.0 ttl pk-yrs)    Types: Cigarettes   Smokeless tobacco: Never  Substance and Sexual Activity   Alcohol use: Yes    Comment: social   Drug use: Never   Sexual activity: Not on file  Other Topics Concern   Not on file  Social History Narrative   Not on file   Social Determinants of Health   Financial Resource Strain: Not on file  Food Insecurity: No Food Insecurity (11/15/2022)   Hunger  Vital Sign    Worried About Running Out of Food in the Last Year: Never true    Ran Out of Food in the Last Year: Never true  Transportation Needs: No Transportation Needs (11/15/2022)   PRAPARE - Administrator, Civil Service (Medical): No    Lack of Transportation (Non-Medical): No  Physical Activity: Not on file  Stress: Not on file  Social Connections: Not on file  Intimate Partner Violence: Not At Risk (11/15/2022)   Humiliation, Afraid, Rape, and Kick questionnaire    Fear of Current or Ex-Partner: No    Emotionally Abused: No    Physically Abused: No    Sexually Abused: No    Past Surgical History:  Procedure Laterality Date   ENDARTERECTOMY FEMORAL Bilateral 11/04/2022   Procedure: ENDARTERECTOMY FEMORAL;  Surgeon: Annice Needy, MD;  Location: [facility] ORS;  Service: Vascular;  Laterality: Bilateral;   HEMATOMA EVACUATION Left 11/06/2022   Procedure: EVACUATION HEMATOMA;  Surgeon: Leonie Douglas, MD;  Location: [facility] ORS;  Service: Vascular;  Laterality: Left;   INSERTION OF ILIAC STENT Bilateral 11/04/2022   Procedure: INSERTION OF ILIAC STENT;  Surgeon: Annice Needy, MD;  Location: [facility] ORS;  Service: Vascular;  Laterality: Bilateral;   LOWER EXTREMITY ANGIOGRAPHY Right 08/25/2020   Procedure: LOWER EXTREMITY ANGIOGRAPHY;  Surgeon: Renford Dills, MD;  Location: [facility] INVASIVE CV LAB;  Service: Cardiovascular;  Laterality: Right;   LOWER EXTREMITY ANGIOGRAPHY Left 06/15/2021   Procedure: LOWER EXTREMITY ANGIOGRAPHY;  Surgeon: Renford Dills, MD;  Location: [facility] INVASIVE CV LAB;  Service: Cardiovascular;  Laterality: Left;   LOWER EXTREMITY ANGIOGRAPHY Left 11/02/2022   Procedure: Lower Extremity Angiography;  Surgeon: Annice Needy, MD;  Location: [facility] INVAS[facility] CV LAB;  Service: Cardiovascular;  Laterality: Left;    Family History  Problem Relation Age of Onset   Heart attack Mother    Heart Problems Father    Heart attack Father     No Known Allergies      Latest Ref Rng & Units 11/19/2022    8:33 AM 11/16/2022    4:46 AM 11/13/2022    5:57 AM  CBC  WBC 4.0 - 10.5 K/uL 9.9  14.2  17.8   Hemoglobin 13.0 - 17.0 g/dL 16.1  9.8  09.6   Hematocrit 39.0 - 52.0 % 32.2  30.9  31.8   Platelets 150 - 400 K/uL 520  571  526  CMP     Component Value Date/Time   NA 138 11/19/2022 0833   K 4.7 11/19/2022 0833   CL 106 11/19/2022 0833   CO2 24 11/19/2022 0833   GLUCOSE 106 (H) 11/19/2022 0833   BUN 22 11/19/2022 0833   CREATININE 1.09 11/19/2022 0833   CALCIUM 8.8 (L) 11/19/2022 0833   PROT 7.3 11/16/2022 0446   ALBUMIN 2.6 (L) 11/16/2022 0446   AST 100 (H) 11/16/2022 0446   ALT 108 (H) 11/16/2022 0446   ALKPHOS 130 (H) 11/16/2022 0446   BILITOT 1.7 (H) 11/16/2022 0446   GFRNONAA >60 11/19/2022 0833     VAS [place] ABI WITH/WO TBI  Result Date: 01/09/2023  LOWER EXTREMITY DOPPLER STUDY Patient Name:  Isiaih Schweikart  Date of Exam:   01/09/2023 Medical Rec #: [id number]     Accession #:    [id number] Date of Birth: 08/21/1955      Patient Gender: M Patient Age:   64 years Exam Location:  [facility] Procedure:      VAS [place] ABI WITH/WO TBI Referring Phys: Sheppard Plumber --------------------------------------------------------------------------------  Indications: Peripheral artery disease.  Vascular Interventions: 11/04/2022 4 Fogarty embolectomy balloon in the left                         iliac arteries for thrombus                         4. 3 Fogarty embolectomy balloon in the left profunda                         femoris artery distally for thrombus                         5. Angioplasty of the left profunda femoris artery                         distal to the endarterectomy with 4 and 5 mm diameter                         Lutonix drug-coated angioplasty balloons                         6. Stent placement to the left external iliac artery                         with 8 mm diameter by 5 cm length Viabahn stent                         7. Stent  placement to the right external iliac artery                         with 8 mm diameter by 5 cm length Viabahn stent. Comparison Study: 05/2022 Performing Technologist: Salvadore Farber RVT  Examination Guidelines: A complete evaluation includes at minimum, Doppler waveform signals and systolic blood pressure reading at the level of bilateral brachial, anterior tibial, and posterior tibial arteries, when vessel segments are accessible. Bilateral testing is considered an integral part of a complete examination. Photoelectric Plethysmograph (PPG) waveforms and toe systolic pressure readings are included as required and additional duplex testing as needed. Limited examinations for  reoccurring indications may be performed as noted.  ABI Findings: +---------+------------------+-----+-------------------+--------+ Right    Rt Pressure (mmHg)IndexWaveform           Comment  +---------+------------------+-----+-------------------+--------+ ATA      68                0.47 dampened monophasic         +---------+------------------+-----+-------------------+--------+ PTA      68                0.47 dampened monophasic         +---------+------------------+-----+-------------------+--------+ Great Toe38                0.26 Abnormal                    +---------+------------------+-----+-------------------+--------+ +---------+------------------+-----+-------------------+-------+ Left     Lt Pressure (mmHg)IndexWaveform           Comment +---------+------------------+-----+-------------------+-------+ Brachial 145                                               +---------+------------------+-----+-------------------+-------+ ATA      76                0.52 dampened monophasic        +---------+------------------+-----+-------------------+-------+ PTA      90                0.62 dampened monophasic        +---------+------------------+-----+-------------------+-------+ Great Toe                        Absent                     +---------+------------------+-----+-------------------+-------+ +-------+-----------+-----------+------------+------------+ ABI/TBIToday's ABIToday's TBIPrevious ABIPrevious TBI +-------+-----------+-----------+------------+------------+ Right  .47        .26        .36         .26          +-------+-----------+-----------+------------+------------+ Left   .52        absent     .59         .47          +-------+-----------+-----------+------------+------------+ Bilateral ABIs and TBIs appear essentially unchanged compared to prior study on 04/2021.  Summary: Right: Resting right ankle-brachial index indicates severe right lower extremity arterial disease. The right toe-brachial index is abnormal. Duplex findings: Rt CFA monophasic, SFA occluded, Pop monophasic, PTA low monophasic, ATA occluded, peroneal low monophasic. Left: Resting left ankle-brachial index indicates moderate left lower extremity arterial disease. The left toe-brachial index is abnormal. Lt CFA strong monophasic, SFA occlusion, Pop monophasic, PTA monophasic, ATA minimal flow, peroneal mono. *See table(s) above for measurements and observations.  Electronically signed by [name] MD on 01/09/2023 at 5:46:41 PM.    Final        Assessment & Plan:   1. Atherosclerosis of native arteries of the extremities with ulceration (HCC)  Recommend:  The patient has evidence of severe atherosclerotic changes of both lower extremities associated with ulceration and tissue loss of the right and left foot.  This represents a limb threatening ischemia and places the patient at the risk for bilateral limb loss.  Patient should undergo angiography of the left followed by right lower extremity with the hope for intervention for limb salvage.  The risks  and benefits as well as the alternative therapies was discussed in detail with the patient.  All questions were answered.  Patient agrees to  proceed with angiography.  The patient will follow up with me in the office after the procedure.   2. Essential hypertension Continue antihypertensive medications as already ordered, these medications have been reviewed and there are no changes at this time.  3. Mixed hyperlipidemia Continue statin as ordered and reviewed, no changes at this time  4. Smoking Smoking cessation was discussed, 3-10 minutes spent on this topic specifically    5. Non-healing open wound of heel, unspecified laterality, initial encounter In addition to revascularization as noted above, the patient will need treatment for these heel ulcerations.  I have described to the patient's family member that heel wounds are exceptionally hard to heal.  Even with revascularization we are looking an uphill battle for healing.  I have advised that they reach out to his podiatrist in order to establish care for treatment of the ongoing wounds  Current Outpatient Medications on File Prior to Visit  Medication Sig Dispense Refill   acetaminophen (TYLENOL) 500 MG tablet Take 500 mg by mouth every 6 (six) hours as needed for moderate pain or mild pain (lEG PAIN).     albuterol (VENTOLIN HFA) 108 (90 Base) MCG/ACT inhaler Inhale 2 puffs into the lungs every 6 (six) hours as needed for wheezing or shortness of breath.     allopurinol (ZYLOPRIM) 300 MG tablet Take 300 mg by mouth daily.     apixaban (ELIQUIS) 5 MG TABS tablet Take 1 tablet (5 mg total) by mouth 2 (two) times daily. 60 tablet    aspirin 81 MG EC tablet Take 325 mg by mouth daily.     atorvastatin (LIPITOR) 80 MG tablet Take 80 mg by mouth daily.     budesonide-formoterol (SYMBICORT) 160-4.5 MCG/ACT inhaler Inhale 2 puffs into the lungs daily.     gabapentin (NEURONTIN) 100 MG capsule Take 100 mg by mouth 2 (two) times daily.     lisinopril (ZESTRIL) 20 MG tablet Take 20 mg by mouth daily.     metoprolol succinate (TOPROL-XL) 50 MG 24 hr tablet Take 50 mg by mouth  daily.     pantoprazole (PROTONIX) 40 MG tablet Take 1 tablet (40 mg total) by mouth daily.     polyethylene glycol (MIRALAX / GLYCOLAX) 17 g packet Take 17 g by mouth 2 (two) times daily. 14 each 0   thiamine (VITAMIN B-1) 100 MG tablet Take 1 tablet (100 mg total) by mouth daily.     No current facility-administered medications on file prior to visit.    There are no Patient Instructions on file for this visit. No follow-ups on file.   Georgiana Spinner, NP

## 2023-01-11 ENCOUNTER — Telehealth (INDEPENDENT_AMBULATORY_CARE_PROVIDER_SITE_OTHER): Payer: Self-pay

## 2023-01-11 NOTE — Telephone Encounter (Signed)
Spoke with the patient's daughter and he is scheduled on 01/16/23 RLE angio (9:00 am arrival), 01/26/23 LLE angio (12:30 pm arrival) to the [facility]. Pre-procedure instructions were discussed and will be mailed. I did ask if she was writing this down as the patient doesn't have a Mychart and was greeted with silence.

## 2023-01-13 ENCOUNTER — Ambulatory Visit: Payer: Medicare Other | Admitting: Physician Assistant

## 2023-01-16 ENCOUNTER — Ambulatory Visit
Admission: RE | Admit: 2023-01-16 | Discharge: 2023-01-16 | Disposition: A | Discharge status: Home / Self Care | Payer: Medicare Other | Source: Ambulatory Visit | Attending: Vascular Surgery | Admitting: Vascular Surgery

## 2023-01-16 ENCOUNTER — Encounter: Payer: Self-pay | Admitting: Vascular Surgery

## 2023-01-16 ENCOUNTER — Other Ambulatory Visit (INDEPENDENT_AMBULATORY_CARE_PROVIDER_SITE_OTHER): Payer: Self-pay | Admitting: Nurse Practitioner

## 2023-01-16 ENCOUNTER — Encounter
Admission: RE | Disposition: A | Discharge status: Home / Self Care | Payer: Self-pay | Source: Ambulatory Visit | Attending: Vascular Surgery

## 2023-01-16 ENCOUNTER — Other Ambulatory Visit: Payer: Self-pay

## 2023-01-16 DIAGNOSIS — F17210 Nicotine dependence, cigarettes, uncomplicated: Secondary | ICD-10-CM | POA: Insufficient documentation

## 2023-01-16 DIAGNOSIS — L97909 Non-pressure chronic ulcer of unspecified part of unspecified lower leg with unspecified severity: Secondary | ICD-10-CM

## 2023-01-16 DIAGNOSIS — L97419 Non-pressure chronic ulcer of right heel and midfoot with unspecified severity: Secondary | ICD-10-CM | POA: Diagnosis not present

## 2023-01-16 DIAGNOSIS — I70234 Atherosclerosis of native arteries of right leg with ulceration of heel and midfoot: Secondary | ICD-10-CM | POA: Diagnosis present

## 2023-01-16 DIAGNOSIS — I10 Essential (primary) hypertension: Secondary | ICD-10-CM | POA: Insufficient documentation

## 2023-01-16 DIAGNOSIS — E782 Mixed hyperlipidemia: Secondary | ICD-10-CM | POA: Insufficient documentation

## 2023-01-16 DIAGNOSIS — I70213 Atherosclerosis of native arteries of extremities with intermittent claudication, bilateral legs: Secondary | ICD-10-CM

## 2023-01-16 DIAGNOSIS — Z539 Procedure and treatment not carried out, unspecified reason: Secondary | ICD-10-CM | POA: Diagnosis not present

## 2023-01-16 LAB — CREATININE, SERUM
Creatinine, Ser: 1.1 mg/dL (ref 0.61–1.24)
GFR, Estimated: >60 mL/min (ref >60)

## 2023-01-16 LAB — BUN: BUN: 12 mg/dL (ref 8–23)

## 2023-01-16 SURGERY — INVASIVE LAB ABORTED CASE

## 2023-01-16 MED ORDER — HYDROMORPHONE HCL 1 MG/ML IJ SOLN: 1.0000 mg | Freq: Once | INTRAMUSCULAR | Status: DC | PRN

## 2023-01-16 MED ORDER — METHYLPREDNISOLONE SODIUM SUCC 125 MG IJ SOLR: 125.0000 mg | Freq: Once | INTRAMUSCULAR | Status: DC | PRN

## 2023-01-16 MED ORDER — HEPARIN SODIUM (PORCINE) 1000 UNIT/ML IJ SOLN
INTRAMUSCULAR | Status: AC
  Filled 2023-01-16: qty 10

## 2023-01-16 MED ORDER — MIDAZOLAM HCL 2 MG/2ML IJ SOLN
INTRAMUSCULAR | Status: AC
  Filled 2023-01-16: qty 2

## 2023-01-16 MED ORDER — MIDAZOLAM HCL 2 MG/2ML IJ SOLN
INTRAMUSCULAR | Status: DC | PRN
  Administered 2023-01-16: 2 mg via INTRAVENOUS

## 2023-01-16 MED ORDER — FENTANYL CITRATE (PF) 100 MCG/2ML IJ SOLN
INTRAMUSCULAR | Status: DC | PRN
  Administered 2023-01-16: 50 ug via INTRAVENOUS

## 2023-01-16 MED ORDER — CEFAZOLIN SODIUM-DEXTROSE 2-4 GM/100ML-% IV SOLN: 2.0000 g | INTRAVENOUS | Status: DC

## 2023-01-16 MED ORDER — CEFAZOLIN SODIUM-DEXTROSE 2-4 GM/100ML-% IV SOLN
INTRAVENOUS | Status: AC
  Filled 2023-01-16: qty 100

## 2023-01-16 MED ORDER — MIDAZOLAM HCL 2 MG/ML PO SYRP: 8.0000 mg | ORAL_SOLUTION | Freq: Once | ORAL | Status: DC | PRN

## 2023-01-16 MED ORDER — DIPHENHYDRAMINE HCL 50 MG/ML IJ SOLN: 50.0000 mg | Freq: Once | INTRAMUSCULAR | Status: DC | PRN

## 2023-01-16 MED ORDER — FAMOTIDINE 20 MG PO TABS: 40.0000 mg | ORAL_TABLET | Freq: Once | ORAL | Status: DC | PRN

## 2023-01-16 MED ORDER — ONDANSETRON HCL 4 MG/2ML IJ SOLN: 4.0000 mg | Freq: Four times a day (QID) | INTRAMUSCULAR | Status: DC | PRN

## 2023-01-16 MED ORDER — SODIUM CHLORIDE 0.9 % IV SOLN: INTRAVENOUS | Status: DC

## 2023-01-16 MED ORDER — FENTANYL CITRATE (PF) 100 MCG/2ML IJ SOLN
INTRAMUSCULAR | Status: AC
  Filled 2023-01-16: qty 2

## 2023-01-16 SURGICAL SUPPLY — 6 items
COVER PROBE ULTRASOUND 5X96 (MISCELLANEOUS) ×1 IMPLANT
PACK ANGIOGRAPHY (CUSTOM PROCEDURE TRAY) ×2 IMPLANT
SHEATH BRITE TIP 5FRX11 (SHEATH) ×1 IMPLANT
SYR MEDRAD MARK 7 150ML (SYRINGE) ×1 IMPLANT
TUBING CONTRAST HIGH PRESS 72 (TUBING) ×1 IMPLANT
WIRE GUIDERIGHT .035X150 (WIRE) ×1 IMPLANT

## 2023-01-16 NOTE — Progress Notes (Signed)
Patient was scheduled for lower extremity angiogram today for severe peripheral arterial disease bilaterally.  Patient was combative and would not remain still to even allow prepping of the patient much less arterial access safely.  He remained combative even after receiving Versed and fentanyl for sedation.  This was clearly not going to be a safe endeavor today and we will reschedule him for another day with anesthesia.

## 2023-01-16 NOTE — Interval H&P Note (Signed)
History and Physical Interval Note:  01/16/2023 9:27 AM  Corey Olson  has presented today for surgery, with the diagnosis of RLE Angio    ASO w ulceration.  The various methods of treatment have been discussed with the patient and family. After consideration of risks, benefits and other options for treatment, the patient has consented to  Procedure(s): Lower Extremity Angiography (Right) as a surgical intervention.  The patient's history has been reviewed, patient examined, no change in status, stable for surgery.  I have reviewed the patient's chart and labs.  Questions were answered to the patient's satisfaction.     [name]

## 2023-01-18 ENCOUNTER — Ambulatory Visit: Payer: Medicare Other | Admitting: Certified Registered"

## 2023-01-18 ENCOUNTER — Other Ambulatory Visit: Payer: Self-pay

## 2023-01-18 ENCOUNTER — Encounter: Payer: Self-pay | Admitting: Vascular Surgery

## 2023-01-18 ENCOUNTER — Encounter
Admission: RE | Disposition: A | Discharge status: Home / Self Care | Payer: Self-pay | Source: Home / Self Care | Attending: Vascular Surgery

## 2023-01-18 ENCOUNTER — Ambulatory Visit
Admission: RE | Admit: 2023-01-18 | Discharge: 2023-01-18 | Disposition: A | Discharge status: Home / Self Care | Payer: Medicare Other | Attending: Vascular Surgery | Admitting: Vascular Surgery

## 2023-01-18 DIAGNOSIS — I70239 Atherosclerosis of native arteries of right leg with ulceration of unspecified site: Secondary | ICD-10-CM | POA: Diagnosis not present

## 2023-01-18 DIAGNOSIS — I70213 Atherosclerosis of native arteries of extremities with intermittent claudication, bilateral legs: Secondary | ICD-10-CM | POA: Diagnosis not present

## 2023-01-18 DIAGNOSIS — I10 Essential (primary) hypertension: Secondary | ICD-10-CM | POA: Insufficient documentation

## 2023-01-18 DIAGNOSIS — E782 Mixed hyperlipidemia: Secondary | ICD-10-CM | POA: Diagnosis not present

## 2023-01-18 DIAGNOSIS — I70234 Atherosclerosis of native arteries of right leg with ulceration of heel and midfoot: Secondary | ICD-10-CM | POA: Insufficient documentation

## 2023-01-18 DIAGNOSIS — I70244 Atherosclerosis of native arteries of left leg with ulceration of heel and midfoot: Secondary | ICD-10-CM | POA: Diagnosis not present

## 2023-01-18 DIAGNOSIS — I70249 Atherosclerosis of native arteries of left leg with ulceration of unspecified site: Secondary | ICD-10-CM | POA: Diagnosis not present

## 2023-01-18 DIAGNOSIS — F17210 Nicotine dependence, cigarettes, uncomplicated: Secondary | ICD-10-CM | POA: Diagnosis not present

## 2023-01-18 DIAGNOSIS — L97419 Non-pressure chronic ulcer of right heel and midfoot with unspecified severity: Secondary | ICD-10-CM | POA: Insufficient documentation

## 2023-01-18 DIAGNOSIS — L97919 Non-pressure chronic ulcer of unspecified part of right lower leg with unspecified severity: Secondary | ICD-10-CM

## 2023-01-18 DIAGNOSIS — L97429 Non-pressure chronic ulcer of left heel and midfoot with unspecified severity: Secondary | ICD-10-CM | POA: Diagnosis not present

## 2023-01-18 DIAGNOSIS — L97929 Non-pressure chronic ulcer of unspecified part of left lower leg with unspecified severity: Secondary | ICD-10-CM | POA: Diagnosis not present

## 2023-01-18 DIAGNOSIS — L97909 Non-pressure chronic ulcer of unspecified part of unspecified lower leg with unspecified severity: Secondary | ICD-10-CM

## 2023-01-18 HISTORY — PX: LOWER EXTREMITY ANGIOGRAPHY: CATH118251

## 2023-01-18 LAB — CREATININE, SERUM
Creatinine, Ser: 1.07 mg/dL (ref 0.61–1.24)
GFR, Estimated: >60 mL/min (ref >60)

## 2023-01-18 LAB — BUN: BUN: 12 mg/dL (ref 8–23)

## 2023-01-18 SURGERY — LOWER EXTREMITY ANGIOGRAPHY
Anesthesia: General | Site: Leg Lower | Laterality: Right

## 2023-01-18 MED ORDER — ONDANSETRON HCL 4 MG/2ML IJ SOLN: 4.0000 mg | Freq: Four times a day (QID) | INTRAMUSCULAR | Status: DC | PRN

## 2023-01-18 MED ORDER — PROPOFOL 1000 MG/100ML IV EMUL
INTRAVENOUS | Status: AC
  Filled 2023-01-18: qty 100

## 2023-01-18 MED ORDER — METHYLPREDNISOLONE SODIUM SUCC 125 MG IJ SOLR: 125.0000 mg | Freq: Once | INTRAMUSCULAR | Status: DC | PRN

## 2023-01-18 MED ORDER — PROPOFOL 10 MG/ML IV BOLUS
INTRAVENOUS | Status: AC
  Filled 2023-01-18: qty 20

## 2023-01-18 MED ORDER — HYDROMORPHONE HCL 1 MG/ML IJ SOLN
INTRAMUSCULAR | Status: AC
  Filled 2023-01-18: qty 0.5

## 2023-01-18 MED ORDER — MIDAZOLAM HCL 2 MG/2ML IJ SOLN
INTRAMUSCULAR | Status: AC
  Filled 2023-01-18: qty 2

## 2023-01-18 MED ORDER — MIDAZOLAM HCL 2 MG/2ML IJ SOLN
INTRAMUSCULAR | Status: DC | PRN
  Administered 2023-01-18: 1 mg via INTRAVENOUS

## 2023-01-18 MED ORDER — HEPARIN SODIUM (PORCINE) 1000 UNIT/ML IJ SOLN
INTRAMUSCULAR | Status: DC | PRN
  Administered 2023-01-18: 5000 [IU] via INTRAVENOUS

## 2023-01-18 MED ORDER — FAMOTIDINE 20 MG PO TABS: 40.0000 mg | ORAL_TABLET | Freq: Once | ORAL | Status: DC | PRN

## 2023-01-18 MED ORDER — MIDAZOLAM HCL 2 MG/ML PO SYRP: 8.0000 mg | ORAL_SOLUTION | Freq: Once | ORAL | Status: DC | PRN

## 2023-01-18 MED ORDER — SODIUM CHLORIDE 0.9 % IV SOLN: INTRAVENOUS | Status: DC

## 2023-01-18 MED ORDER — HYDRALAZINE HCL 20 MG/ML IJ SOLN: 5.0000 mg | INTRAMUSCULAR | Status: DC | PRN

## 2023-01-18 MED ORDER — KETAMINE HCL 10 MG/ML IJ SOLN
INTRAMUSCULAR | Status: DC | PRN
  Administered 2023-01-18 (×3): 10 mg via INTRAVENOUS

## 2023-01-18 MED ORDER — FENTANYL CITRATE (PF) 100 MCG/2ML IJ SOLN
INTRAMUSCULAR | Status: AC
  Filled 2023-01-18: qty 2

## 2023-01-18 MED ORDER — SODIUM CHLORIDE 0.9% FLUSH: 3.0000 mL | INTRAVENOUS | Status: DC | PRN

## 2023-01-18 MED ORDER — LIDOCAINE-EPINEPHRINE (PF) 1 %-1:200000 IJ SOLN
INTRAMUSCULAR | Status: DC | PRN
  Administered 2023-01-18: 10 mL via INTRADERMAL

## 2023-01-18 MED ORDER — IODIXANOL 320 MG/ML IV SOLN
INTRAVENOUS | Status: DC | PRN
  Administered 2023-01-18: 65 mL via INTRA_ARTERIAL

## 2023-01-18 MED ORDER — KETAMINE HCL 50 MG/5ML IJ SOSY
PREFILLED_SYRINGE | INTRAMUSCULAR | Status: AC
  Filled 2023-01-18: qty 5

## 2023-01-18 MED ORDER — HYDROCODONE-ACETAMINOPHEN 5-325 MG PO TABS: 1.0000 | ORAL_TABLET | Freq: Once | ORAL | Status: AC | PRN

## 2023-01-18 MED ORDER — LABETALOL HCL 5 MG/ML IV SOLN: 10.0000 mg | INTRAVENOUS | Status: DC | PRN

## 2023-01-18 MED ORDER — PROPOFOL 10 MG/ML IV BOLUS
INTRAVENOUS | Status: DC | PRN
Start: 2023-01-18 — End: 2023-01-18
  Administered 2023-01-18: 60 mg via INTRAVENOUS
  Administered 2023-01-18 (×17): 30 mg via INTRAVENOUS
  Administered 2023-01-18: 60 mg via INTRAVENOUS
  Administered 2023-01-18 (×9): 30 mg via INTRAVENOUS

## 2023-01-18 MED ORDER — HYDROMORPHONE HCL 1 MG/ML IJ SOLN: 1.0000 mg | Freq: Once | INTRAMUSCULAR | Status: AC | PRN

## 2023-01-18 MED ORDER — HYDROCODONE-ACETAMINOPHEN 5-325 MG PO TABS
ORAL_TABLET | ORAL | Status: AC
  Administered 2023-01-18: 2 via ORAL
  Filled 2023-01-18: qty 2

## 2023-01-18 MED ORDER — SODIUM CHLORIDE 0.9% FLUSH: 3.0000 mL | Freq: Two times a day (BID) | INTRAVENOUS | Status: DC

## 2023-01-18 MED ORDER — ACETAMINOPHEN 325 MG PO TABS: 650.0000 mg | ORAL_TABLET | ORAL | Status: DC | PRN

## 2023-01-18 MED ORDER — CEFAZOLIN SODIUM-DEXTROSE 2-4 GM/100ML-% IV SOLN
2.0000 g | INTRAVENOUS | Status: AC
  Administered 2023-01-18: 2 g via INTRAVENOUS

## 2023-01-18 MED ORDER — SODIUM CHLORIDE 0.9 % IV SOLN: 250.0000 mL | INTRAVENOUS | Status: DC | PRN

## 2023-01-18 MED ORDER — CEFAZOLIN SODIUM-DEXTROSE 2-4 GM/100ML-% IV SOLN
INTRAVENOUS | Status: AC
  Filled 2023-01-18: qty 100

## 2023-01-18 MED ORDER — FENTANYL CITRATE (PF) 100 MCG/2ML IJ SOLN
INTRAMUSCULAR | Status: DC | PRN
  Administered 2023-01-18 (×2): 25 ug via INTRAVENOUS

## 2023-01-18 MED ORDER — DIPHENHYDRAMINE HCL 50 MG/ML IJ SOLN: 50.0000 mg | Freq: Once | INTRAMUSCULAR | Status: DC | PRN

## 2023-01-18 MED ORDER — HYDROMORPHONE HCL 1 MG/ML IJ SOLN
INTRAMUSCULAR | Status: AC
  Administered 2023-01-18: 1 mg via INTRAVENOUS
  Filled 2023-01-18: qty 0.5

## 2023-01-18 MED ORDER — HEPARIN (PORCINE) IN NACL 1000-0.9 UT/500ML-% IV SOLN
INTRAVENOUS | Status: DC | PRN
  Administered 2023-01-18: 1000 mL

## 2023-01-18 MED ORDER — PHENYLEPHRINE HCL (PRESSORS) 10 MG/ML IV SOLN
INTRAVENOUS | Status: DC | PRN
  Administered 2023-01-18 (×2): 80 ug via INTRAVENOUS

## 2023-01-18 SURGICAL SUPPLY — 35 items
BALLN ARMADA 2.5X200X150 (BALLOONS) ×1
BALLN ARMADA 2.5X200X150X (BALLOONS) ×1
BALLN LUTONIX 018 4X300X130 (BALLOONS) ×1
BALLN LUTONIX 5X220X130 (BALLOONS) ×1
BALLN LUTONIX 6X220X130 (BALLOONS) ×1
BALLN ULTRVRSE 2.5X300X150 (BALLOONS) ×1
BALLN ULTRVRSE 3X300X130 (BALLOONS) ×1
BALLOON ARMADA 2.5X200X150X (BALLOONS) IMPLANT
BALLOON LUTONIX 018 4X300X130 (BALLOONS) IMPLANT
BALLOON LUTONIX 5X220X130 (BALLOONS) IMPLANT
BALLOON LUTONIX 6X220X130 (BALLOONS) IMPLANT
BALLOON ULTRVRSE 2.5X300X150 (BALLOONS) IMPLANT
BALLOON ULTRVRSE 3X300X130 (BALLOONS) IMPLANT
CATH ANGIO 5F PIGTAIL 65CM (CATHETERS) IMPLANT
CATH BEACON 5 .038 100 VERT TP (CATHETERS) IMPLANT
CATH NAVICROSS ANGLED 135CM (MICROCATHETER) IMPLANT
CATH SEEKER .018X150 (CATHETERS) IMPLANT
CATH SEEKER .035X150CM (CATHETERS) IMPLANT
COVER PROBE ULTRASOUND 5X96 (MISCELLANEOUS) IMPLANT
DEVICE STARCLOSE SE CLOSURE (Vascular Products) IMPLANT
GLIDEWIRE ADV .035X260CM (WIRE) IMPLANT
GUIDEWIRE PFTE-COATED .018X300 (WIRE) IMPLANT
GUIDEWIRE SUPER STIFF .035X180 (WIRE) IMPLANT
KIT ENCORE 26 ADVANTAGE (KITS) IMPLANT
LIFESTENT SOLO 6X200X135 (Permanent Stent) IMPLANT
PACK ANGIOGRAPHY (CUSTOM PROCEDURE TRAY) ×1 IMPLANT
SHEATH ANL2 6FRX45 HC (SHEATH) IMPLANT
SHEATH BRITE TIP 4FRX11 (SHEATH) IMPLANT
SHEATH BRITE TIP 5FRX11 (SHEATH) IMPLANT
SHEATH HALO 6F 45 (SHEATH) IMPLANT
STENT LIFESTENT 5F 7X80X135 (Permanent Stent) IMPLANT
SYR MEDRAD MARK 7 150ML (SYRINGE) IMPLANT
TUBING CONTRAST HIGH PRESS 72 (TUBING) IMPLANT
WIRE G V18X300CM (WIRE) IMPLANT
WIRE GUIDERIGHT .035X150 (WIRE) IMPLANT

## 2023-01-18 NOTE — Anesthesia Procedure Notes (Signed)
Procedure Name: MAC Date/Time: 01/18/2023 11:54 AM  Performed by: Katherine Basset, CRNAPre-anesthesia Checklist: Patient identified, Emergency Drugs available, Patient being monitored and Suction available Patient Re-evaluated:Patient Re-evaluated prior to induction Oxygen Delivery Method: Simple face mask

## 2023-01-18 NOTE — Anesthesia Postprocedure Evaluation (Signed)
Anesthesia Post Note  Patient: Corey Olson  Procedure(s) Performed: Lower Extremity Angiography (Right: Leg Lower)  Patient location during evaluation: PACU Anesthesia Type: General Level of consciousness: awake and alert Pain management: pain level controlled Vital Signs Assessment: post-procedure vital signs reviewed and stable Respiratory status: spontaneous breathing, nonlabored ventilation, respiratory function stable and patient connected to nasal cannula oxygen Cardiovascular status: blood pressure returned to baseline and stable Postop Assessment: no apparent nausea or vomiting Anesthetic complications: no   No notable events documented.   Last Vitals:  Vitals:   01/18/23 1400 01/18/23 1415  BP: 126/63 134/71  Pulse: (!) 59 63  Resp: 13 17  Temp:    SpO2: 100% 100%    Last Pain:  Vitals:   01/18/23 1415  TempSrc:   PainSc: Asleep                 Corinda Gubler

## 2023-01-18 NOTE — Interval H&P Note (Signed)
History and Physical Interval Note:  01/18/2023 11:52 AM  Corey Olson  has presented today for surgery, with the diagnosis of RLE Angio   ANESTHESIA   ASO w ulceration.  The various methods of treatment have been discussed with the patient and family. After consideration of risks, benefits and other options for treatment, the patient has consented to  Procedure(s): Lower Extremity Angiography (Right) as a surgical intervention.  The patient's history has been reviewed, patient examined, no change in status, stable for surgery.  I have reviewed the patient's chart and labs.  Questions were answered to the patient's satisfaction.     [name]

## 2023-01-18 NOTE — Anesthesia Preprocedure Evaluation (Signed)
Anesthesia Evaluation  Patient identified by MRN, date of birth, ID band Patient confused  General Assessment Comment:  Patient AO x 1 to me (only oriented to self, did not know his birthday).  Consent obtained from daughter Corey Olson by phone. Per daughter, this is his baseline  Reviewed: Allergy & Precautions, NPO status , Patient's Chart, lab work & pertinent test results  History of Anesthesia Complications Negative for: history of anesthetic complications  Airway Mallampati: III  TM Distance: >3 FB Neck ROM: Full    Dental  (+) Poor Dentition, Missing, Edentulous Upper   Pulmonary neg sleep apnea, COPD, Current Smoker and Patient abstained from smoking.   Pulmonary exam normal breath sounds clear to auscultation       Cardiovascular Exercise Tolerance: Poor METShypertension, + CAD and + Peripheral Vascular Disease  (-) Past MI (-) dysrhythmias  Rhythm:Regular Rate:Normal - Systolic murmurs TTE 2024:  1. Left ventricular ejection fraction, by estimation, is 50 to 55%. The  left ventricle has low normal function. The left ventricle has no regional  wall motion abnormalities. Left ventricular diastolic parameters are  consistent with Grade I diastolic  dysfunction (impaired relaxation). The average left ventricular global  longitudinal strain is -8.9 %.   2. Right ventricular systolic function is normal. The right ventricular  size is normal.   3. The mitral valve is normal in structure. Mild mitral valve  regurgitation. No evidence of mitral stenosis.   4. The aortic valve is tricuspid. Aortic valve regurgitation is not  visualized. Aortic valve sclerosis is present, with no evidence of aortic  valve stenosis.   5. There is borderline dilatation of the aortic root, measuring 37 mm.   6. The inferior vena cava is normal in size with greater than 50%  respiratory variability, suggesting right atrial pressure of 3 mmHg.    Per cardiology, patient is optimized at acceptable risk for anesthesia   Neuro/Psych Right sided weakness.  Per neurology, no contraindications to anesthesia at this time. CVA, Residual Symptoms  negative psych ROS   GI/Hepatic ,neg GERD  ,,(+)     (-) substance abuse    Endo/Other  neg diabetesHypothyroidism    Renal/GU negative Renal ROS     Musculoskeletal  (+) Arthritis ,    Abdominal   Peds  Hematology   Anesthesia Other Findings Past Medical History: No date: Alcohol use disorder, mild, abuse No date: COPD (chronic obstructive pulmonary disease) (HCC) No date: Gout No date: Hyperlipidemia No date: Hypertension No date: Hypothyroid No date: Peripheral vascular complication No date: Stroke [facility])  Reproductive/Obstetrics                             Anesthesia Physical Anesthesia Plan  ASA: 3  Anesthesia Plan: General   Post-op Pain Management: Minimal or no pain anticipated   Induction: Intravenous  PONV Risk Score and Plan: 1 and Propofol infusion, TIVA and Ondansetron  Airway Management Planned: Nasal Cannula and Natural Airway  Additional Equipment: None  Intra-op Plan:   Post-operative Plan:   Informed Consent: I have reviewed the patients History and Physical, chart, labs and discussed the procedure including the risks, benefits and alternatives for the proposed anesthesia with the patient or authorized representative who has indicated his/her understanding and acceptance.     Dental advisory given and Consent reviewed with POA  Plan Discussed with: CRNA and Surgeon  Anesthesia Plan Comments: (Discussed risks of anesthesia with patient's POA/daughter Corey Olson by  phone, including possibility of difficulty with spontaneous ventilation under anesthesia necessitating airway intervention, PONV, and rare risks such as cardiac or respiratory or neurological events, and allergic reactions. Discussed the role of CRNA in  patient's perioperative care. She understands. She confirms patient is full code.)       Anesthesia Quick Evaluation

## 2023-01-18 NOTE — Op Note (Signed)
[facility]  Percutaneous Study/Intervention Procedural Note   Date of Surgery: 01/18/2023  Surgeon(s):Esbeidy Mclaine    Assistants:none  Pre-operative Diagnosis: PAD with ulceration RLE  Post-operative diagnosis:  Same  Procedure(s) Performed:             1.  Ultrasound guidance for vascular access left femoral artery             2.  Catheter placement into right common femoral artery from left femoral approach             3.  Aortogram and selective right lower extremity angiogram             4.  Percutaneous transluminal angioplasty of right anterior tibial artery with 2.5 and 3 mm diameter angioplasty balloons             5.  Percutaneous transluminal angioplasty of the right SFA and popliteal arteries with 2 inflations with a 4 mm diameter by 30 cm length Lutonix drug-coated angioplasty balloon  6.  Stent placement x 3 to the right SFA and popliteal arteries with 6 mm diameter by 20 cm length life stent, 6 mm diameter by 20 cm length life stent, 7 mm diameter by 8 cm length life stent             7.  StarClose closure device left femoral artery  EBL: 20 cc  Contrast: 65 cc  Fluoro Time: 22.4 minutes  Anesthesia: MAC              Indications:  Patient is a 67 y.o.male with severe peripheral arterial disease and nonhealing ulcerations on both lower extremities even after femoral endarterectomies and aortoiliac interventions. The patient has noninvasive study showing significant reduction of the ABI bilaterally. The patient is brought in for angiography for further evaluation and potential treatment.  Due to the limb threatening nature of the situation, angiogram was performed for attempted limb salvage. The patient is aware that if the procedure fails, amputation would be expected.  The patient also understands that even with successful revascularization, amputation may still be required due to the severity of the situation.  Risks and benefits are discussed and  informed consent is obtained.   Procedure:  The patient was identified and appropriate procedural time out was performed.  The patient was then placed supine on the table and prepped and draped in the usual sterile fashion.  Anesthesia provided heavy sedation.  Ultrasound was used to evaluate the left common femoral artery.  It was patent .  A digital ultrasound image was acquired.  A Seldinger needle was used to access the left common femoral artery under direct ultrasound guidance and a permanent image was performed.  A 0.035 J wire was advanced without resistance and a 5Fr sheath was placed.  Pigtail catheter was placed into the aorta and an AP aortogram was performed. This demonstrated normal renal arteries and normal aorta and iliac segments without significant stenosis after extensive iliac stenting previously. I then crossed the aortic bifurcation and advanced to the right femoral head. Selective right lower extremity angiogram was then performed. This demonstrated near flush occlusion of the right SFA with reconstitution of a heavily diseased popliteal artery with high-grade stenosis or occlusion in the mid popliteal artery.  The anterior tibial artery was really the only runoff distally and it was heavily diseased with multiple areas of greater than 80% stenosis with the worst area in the proximal anterior tibial artery.  The peroneal artery  did reconstitute through collaterals distally but did not really fill the foot.  The posterior tibial artery was chronically occluded without distal reconstitution identified. It was felt that it was in the patient's best interest to proceed with intervention after these images to avoid a second procedure and a larger amount of contrast and fluoroscopy based off of the findings from the initial angiogram. The patient was systemically heparinized and a 6 [place] [employer] sheath was then placed over the [employer] wire. I then used a Kumpe catheter and the advantage  wire to navigate through the long segment right SFA and popliteal occlusion and confirm flow in the below-knee popliteal artery.  I then was able to get 0.018 advantage wire down to the anterior tibial artery disease and parked the wire in the foot.  However, over this wire due to the steep aortic bifurcation and the heavy calcific disease, getting balloons or catheters to track down distally was extremely tedious.  We ultimately were able to get a 0.018 seeker catheter down into the mid to distal anterior tibial artery confirming intraluminal flow and then exchanged for a V18 wire.  A 2.5 mm diameter by 30 cm length angioplasty balloon was then taken down into the anterior tibial artery but only into the proximal to mid segment and would not cross the second level of occlusive disease in the anterior tibial artery due to the heavy calcific nature and the tremendous amount of stored energy.  The 2.5 mm balloon was inflated to 10 atm for 1 minute.  I used this balloon to predilate the SFA and popliteal lesions as well.  Ultimately, I elected to exchange for a 0.035 advantage wire for better support.  This was parked in the distal anterior tibial artery.  I then took a 3 mm diameter angioplasty balloon down into the anterior tibial artery again only crossing the more proximal area of stenosis.  This was inflated to 6 atm for 1 minute.  This was also used to predilate the popliteal and SFA lesions.  A 4 mm diameter by 30 cm length Lutonix drug-coated angioplasty balloon was used to treat the SFA and popliteal arteries with 2 inflations both up to 12 atm.  Completion imaging following this showed the SFA and popliteal arteries to still have multiple areas of greater than 50% stenosis and I elected to place stents.  A 6 mm diameter by 20 cm length life stent was started in the below-knee popliteal artery and taken up to Hunter's canal.  A second 6 mm diameter by 20 cm length life stent was then used to treat the majority  of the SFA with a 7 mm diameter by 8 cm length life stent used in the proximal SFA ending about a centimeter to 2 cm below its origin above the original occlusion.  These were postdilated with 5 mm diameter Lutonix drug-coated angioplasty balloon distally and 6 mm diameter Lutonix drug-coated angioplasty balloon proximally with excellent angiographic completion result and less than 10% residual stenosis in the SFA and popliteal arteries.  The proximal anterior tibial artery was treated look much better with only about a 20 to 30% residual stenosis.  At this point, there is really little more I could do as I could not get down to treat the more distal anterior tibial artery and we will hope that the inflow improvement and stenting will help the perfusion to his foot. I elected to terminate the procedure. The sheath was removed and StarClose closure device was deployed  in the left femoral artery with excellent hemostatic result. The patient was taken to the recovery room in stable condition having tolerated the procedure well.  Findings:               Aortogram:  This demonstrated normal renal arteries and normal aorta and iliac segments without significant stenosis after extensive iliac stenting previously.             Right Lower Extremity:  This demonstrated near flush occlusion of the right SFA with reconstitution of a heavily diseased popliteal artery with high-grade stenosis or occlusion in the mid popliteal artery.  The anterior tibial artery was really the only runoff distally and it was heavily diseased with multiple areas of greater than 80% stenosis with the worst area in the proximal anterior tibial artery.  The peroneal artery did reconstitute through collaterals distally but did not really fill the foot.  The posterior tibial artery was chronically occluded without distal reconstitution identified.   Disposition: Patient was taken to the recovery room in stable condition having tolerated the  procedure well.  Complications: None  [name] 01/18/2023 1:42 PM   This note was created with Dragon Medical transcription system. Any errors in dictation are purely unintentional.

## 2023-01-18 NOTE — Transfer of Care (Signed)
Immediate Anesthesia Transfer of Care Note  Patient: Gaetan Uhls  Procedure(s) Performed: Lower Extremity Angiography (Right: Leg Lower)  Patient Location: Short Stay  Anesthesia Type:General  Level of Consciousness: drowsy  Airway & Oxygen Therapy: Patient Spontanous Breathing and Patient connected to face mask oxygen  Post-op Assessment: Report given to RN, Post -op Vital signs reviewed and stable, and Patient moving all extremities  Post vital signs: Reviewed and stable  Last Vitals:  Vitals Value Taken Time  BP 115/68 01/18/23 1355  Temp 36.4 C 01/18/23 1355  Pulse 61 01/18/23 1355  Resp 13 01/18/23 1355  SpO2 100 % 01/18/23 1355  Vitals shown include unfiled device data.  Last Pain:  Vitals:   01/18/23 1355  TempSrc: Temporal  PainSc:          Complications: No notable events documented.

## 2023-01-19 ENCOUNTER — Encounter: Payer: Self-pay | Admitting: Vascular Surgery

## 2023-01-20 NOTE — Progress Notes (Addendum)
DANDREW, HUBLER ([id number]) [id number] Page 1 of 10 Visit Report for 01/05/2023 Allergy List Details Patient Name: Date of Service: Corey Olson, Corey Olson 01/05/2023 9:30 A M Medical Record Number: [id number] Patient Account Number: [id number] Date of Birth/Sex: Treating RN: 10-04-1955 (67 y.o. Male) Yevonne Pax Primary Care Shana Zavaleta: Darreld Mclean Other Clinician: Referring Ethanael Veith: Treating Traeton Bordas/Extender: Kathreen Devoid in Treatment: 0 Allergies Active Allergies No Known Allergies Allergy Notes Electronic Signature(s) Signed: 01/20/2023 11:59:03 AM By: Yevonne Pax RN Entered By: Yevonne Pax on 01/05/2023 07:17:45 -------------------------------------------------------------------------------- Arrival Information Details Patient Name: Date of Service: Corey Olson, Corey Olson 01/05/2023 9:30 A M Medical Record Number: [id number] Patient Account Number: [id number] Date of Birth/Sex: Treating RN: 1955/10/21 (67 y.o. Male) Yevonne Pax Primary Care Suhey Radford: Darreld Mclean Other Clinician: Referring Keneisha Heckart: Treating Kanika Bungert/Extender: Kathreen Devoid in Treatment: 0 Visit Information Patient Arrived: Wheel Chair Arrival Time: 09:42 Accompanied By: daughter Transfer Assistance: None Patient Identification Verified: Yes Secondary Verification Process Completed: Yes Patient Requires Transmission-Based Precautions: No Patient Has Alerts: Yes Patient Alerts: Patient on Blood Thinner Electronic Signature(s) Signed: 01/20/2023 11:59:03 AM By: Yevonne Pax RN Entered By: Yevonne Pax on 01/05/2023 06:43:21 [name] ([id number]) [id number] Page 2 of 10 -------------------------------------------------------------------------------- Clinic Level of Care Assessment Details Patient Name: Date of Service: Corey Olson, Corey Olson 01/05/2023 9:30 A M Medical Record Number: [id number] Patient Account Number: [id number] Date of  Birth/Sex: Treating RN: 09/13/1955 (67 y.o. Male) Yevonne Pax Primary Care Maclane Holloran: Darreld Mclean Other Clinician: Referring Tove Wideman: Treating Finnley Larusso/Extender: Kathreen Devoid in Treatment: 0 Clinic Level of Care Assessment Items TOOL 2 Quantity Score X- 1 0 Use when only an EandM is performed on the INITIAL visit ASSESSMENTS - Nursing Assessment / Reassessment X- 1 20 General Physical Exam (combine w/ comprehensive assessment (listed just below) when performed on new pt. evals) X- 1 25 Comprehensive Assessment (HX, ROS, Risk Assessments, Wounds Hx, etc.) ASSESSMENTS - Wound and Skin A ssessment / Reassessment X - Simple Wound Assessment / Reassessment - one wound 1 5 []  - 0 Complex Wound Assessment / Reassessment - multiple wounds []  - 0 Dermatologic / Skin Assessment (not related to wound area) ASSESSMENTS - Ostomy and/or Continence Assessment and Care []  - 0 Incontinence Assessment and Management []  - 0 Ostomy Care Assessment and Management (repouching, etc.) PROCESS - Coordination of Care X - Simple Patient / Family Education for ongoing care 1 15 []  - 0 Complex (extensive) Patient / Family Education for ongoing care []  - 0 Staff obtains Chiropractor, Records, T Results / Process Orders est []  - 0 Staff telephones HHA, Nursing Homes / Clarify orders / etc []  - 0 Routine Transfer to another Facility (non-emergent condition) []  - 0 Routine Hospital Admission (non-emergent condition) []  - 0 New Admissions / Manufacturing engineer / Ordering NPWT Apligraf, etc. , []  - 0 Emergency Hospital Admission (emergent condition) X- 1 10 Simple Discharge Coordination []  - 0 Complex (extensive) Discharge Coordination PROCESS - Special Needs []  - 0 Pediatric / Minor Patient Management []  - 0 Isolation Patient Management []  - 0 Hearing / Language / Visual special needs []  - 0 Assessment of Community assistance (transportation, Corey Olson/C planning, etc.) []  -  0 Additional assistance / Altered mentation []  - 0 Support Surface(s) Assessment (bed, cushion, seat, etc.) INTERVENTIONS - Wound Cleansing / Measurement Corey Olson, Corey Olson ([id number]) [phone].pdf Page 3 of 10 X- 1 5 Wound Imaging (photographs - any number of wounds) []  - 0 Wound Tracing (instead of photographs) X- 1 5 Simple Wound Measurement - one  wound []  - 0 Complex Wound Measurement - multiple wounds X- 1 5 Simple Wound Cleansing - one wound []  - 0 Complex Wound Cleansing - multiple wounds INTERVENTIONS - Wound Dressings X - Small Wound Dressing one or multiple wounds 1 10 []  - 0 Medium Wound Dressing one or multiple wounds []  - 0 Large Wound Dressing one or multiple wounds []  - 0 Application of Medications - injection INTERVENTIONS - Miscellaneous []  - 0 External ear exam []  - 0 Specimen Collection (cultures, biopsies, blood, body fluids, etc.) []  - 0 Specimen(s) / Culture(s) sent or taken to Lab for analysis []  - 0 Patient Transfer (multiple staff / Nurse, adult / Similar devices) []  - 0 Simple Staple / Suture removal (25 or less) []  - 0 Complex Staple / Suture removal (26 or more) []  - 0 Hypo / Hyperglycemic Management (close monitor of Blood Glucose) []  - 0 Ankle / Brachial Index (ABI) - do not check if billed separately Has the patient been seen at the hospital within the last three years: Yes Total Score: 100 Level Of Care: New/Established - Level 3 Electronic Signature(s) Signed: 01/20/2023 11:59:03 AM By: Yevonne Pax RN Entered By: Yevonne Pax on 01/05/2023 07:58:45 -------------------------------------------------------------------------------- Encounter Discharge Information Details Patient Name: Date of Service: Corey Olson, Corey Olson 01/05/2023 9:30 A M Medical Record Number: [id number] Patient Account Number: [id number] Date of Birth/Sex: Treating RN: 1955-06-27 (67 y.o. Male) Yevonne Pax Primary Care Saya Mccoll: Darreld Mclean Other  Clinician: Referring Trajon Rosete: Treating Issis Lindseth/Extender: Kathreen Devoid in Treatment: 0 Encounter Discharge Information Items Discharge Condition: Stable Ambulatory Status: Wheelchair Discharge Destination: Home Transportation: Private Auto Accompanied By: daughter Schedule Follow-up Appointment: Yes Clinical Summary of CareDELL, SANTELLAN ([id number]) [id number] Page 4 of 10 Electronic Signature(s) Signed: 01/05/2023 10:59:37 AM By: Yevonne Pax RN Entered By: Yevonne Pax on 01/05/2023 07:59:36 -------------------------------------------------------------------------------- Lower Extremity Assessment Details Patient Name: Date of Service: Corey Olson, Corey Olson 01/05/2023 9:30 A M Medical Record Number: [id number] Patient Account Number: [id number] Date of Birth/Sex: Treating RN: 08/04/1955 (67 y.o. Male) Yevonne Pax Primary Care Vong Garringer: Darreld Mclean Other Clinician: Referring Rhiley Tarver: Treating Sarath Privott/Extender: Kathreen Devoid in Treatment: 0 Edema Assessment Assessed: [Left: No] [Right: No] Edema: [Left: No] [Right: No] Calf Left: Right: Point of Measurement: 34 cm From Medial Instep 31.4 cm 31 cm Ankle Left: Right: Point of Measurement: 12 cm From Medial Instep 23.3 cm 22 cm Knee To Floor Left: Right: From Medial Instep 47 cm 47 cm Vascular Assessment Pulses: Dorsalis Pedis Palpable: [Left:No] [Right:No] Doppler Audible: [Left:Yes] [Right:Inaudible] Posterior Tibial Palpable: [Left:No] [Right:No] Doppler Audible: [Left:Yes] [Right:Inaudible] Extremity colors, hair growth, and conditions: Extremity Color: [Left:Hyperpigmented] [Right:Hyperpigmented] Hair Growth on Extremity: [Left:No] [Right:No] Temperature of Extremity: [Left:Cool] [Right:Cool] Capillary Refill: [Left:> 3 seconds] [Right:> 3 seconds] Dependent Rubor: [Left:No] [Right:No] Blanched when Elevated: [Left:No] [Right:No] Lipodermatosclerosis:  [Left:No] [Right:No] Blood Pressure: Brachial: [Left:160] Ankle: [Left:Dorsalis Pedis: 50 0.31] Toe Nail Assessment Left: Right: Thick: Yes Yes Discolored: Yes Yes Deformed: Yes Yes Improper Length and HygieneSLOAN, CHERVENAK ([id number]) [id number] Page 5 of 10 Electronic Signature(s) Signed: 01/20/2023 11:59:03 AM By: Yevonne Pax RN Entered By: Yevonne Pax on 01/05/2023 07:17:40 -------------------------------------------------------------------------------- Multi Wound Chart Details Patient Name: Date of Service: Corey Olson, Corey Olson 01/05/2023 9:30 A M Medical Record Number: [id number] Patient Account Number: [id number] Date of Birth/Sex: Treating RN: 1955/10/10 (67 y.o. Male) Yevonne Pax Primary Care Jahziah Simonin: Darreld Mclean Other Clinician: Referring Amadu Schlageter: Treating Zikeria Keough/Extender: Kathreen Devoid in Treatment: 0 Vital Signs Height(in): 68 Pulse(bpm): 91 Weight(lbs):  160 Blood Pressure(mmHg): 160/74 Body Mass Index(BMI): 24.3 Temperature(F): 98 Respiratory Rate(breaths/min): 18 [Treatment Notes:Wound Assessments Treatment Notes] Electronic Signature(s) Signed: 01/20/2023 11:59:03 AM By: Yevonne Pax RN Entered By: Yevonne Pax on 01/05/2023 07:10:44 -------------------------------------------------------------------------------- Multi-Disciplinary Care Plan Details Patient Name: Date of Service: Corey Olson, Corey Olson 01/05/2023 9:30 A M Medical Record Number: [id number] Patient Account Number: [id number] Date of Birth/Sex: Treating RN: 1955/08/08 (67 y.o. Male) Yevonne Pax Primary Care Nickoli Bagheri: Darreld Mclean Other Clinician: Referring Danylah Holden: Treating Willmar Stockinger/Extender: Kathreen Devoid in Treatment: 0 Active Inactive Electronic Signature(s) Signed: 03/08/2023 10:38:07 AM By: [name], BSN, RN, CWS, Kim RN, BSN Signed: 04/05/2023 4:44:34 PM By: Yevonne Pax RN Previous Signature: 01/20/2023 11:59:03 AM Version  By: Yevonne Pax RN Entered By: [name], BSN, RN, CWS, Kim on 03/08/2023 07:38:07 [name] ([id number]) [id number] Page 6 of 10 -------------------------------------------------------------------------------- Pain Assessment Details Patient Name: Date of Service: Corey Olson, Corey Olson 01/05/2023 9:30 A M Medical Record Number: [id number] Patient Account Number: [id number] Date of Birth/Sex: Treating RN: 01-26-1956 (67 y.o. Male) Yevonne Pax Primary Care Joclynn Lumb: Darreld Mclean Other Clinician: Referring Hogan Hoobler: Treating Addis Tuohy/Extender: Kathreen Devoid in Treatment: 0 Active Problems Location of Pain Severity and Description of Pain Patient Has Paino Yes Site Locations With Dressing Change: Yes Duration of the Pain. Constant / Intermittento Intermittent How Long Does it Lasto Hours: Minutes: 15 Rate the pain. Current Pain Level: 5 Worst Pain Level: 7 Least Pain Level: 0 Tolerable Pain Level: 5 Character of Pain Describe the Pain: Aching Pain Management and Medication Current Pain Management: Medication: Yes Cold Application: No Rest: Yes Massage: No Activity: No T.E.N.S.: No Heat Application: No Leg drop or elevation: No Is the Current Pain Management Adequate: Inadequate How does your wound impact your activities of daily livingo Sleep: Yes Bathing: No Appetite: No Relationship With Others: No Bladder Continence: No Emotions: No Bowel Continence: No Work: No Toileting: No Drive: No Dressing: No Hobbies: No Electronic Signature(s) Signed: 01/20/2023 11:59:03 AM By: Yevonne Pax RN Entered By: Yevonne Pax on 01/05/2023 06:45:45 [name] ([id number]) [id number] Page 7 of 10 -------------------------------------------------------------------------------- Patient/Caregiver Education Details Patient Name: Date of Service: Corey Olson, Corey Olson 8/15/2024andnbsp9:30 A M Medical Record Number:  [id number] Patient Account Number: [id number] Date of Birth/Gender: Treating RN: 1955/06/09 (67 y.o. Male) Yevonne Pax Primary Care Physician: Darreld Mclean Other Clinician: Referring Physician: Treating Physician/Extender: Kathreen Devoid in Treatment: 0 Education Assessment Education Provided To: Patient and Caregiver Education Topics Provided Pressure: Handouts: Pressure Injury: Prevention and Offloading Methods: Explain/Verbal Responses: State content correctly Electronic Signature(s) Signed: 01/20/2023 11:59:03 AM By: Yevonne Pax RN Entered By: Yevonne Pax on 01/05/2023 07:12:19 -------------------------------------------------------------------------------- Wound Assessment Details Patient Name: Date of Service: Corey Olson, Corey Olson 01/05/2023 9:30 A M Medical Record Number: [id number] Patient Account Number: [id number] Date of Birth/Sex: Treating RN: Oct 14, 1955 (67 y.o. Male) Yevonne Pax Primary Care Maxden Naji: Darreld Mclean Other Clinician: Referring Halie Gass: Treating Chudney Scheffler/Extender: Kathreen Devoid in Treatment: 0 Wound Status Wound Number: 1 Primary Pressure Ulcer Etiology: Wound Location: Right Calcaneus Wound Open Wounding Event: Gradually Appeared Status: Date Acquired: 11/21/2022 Comorbid Chronic Obstructive Pulmonary Disease (COPD), Coronary Artery Weeks Of Treatment: 0 History: Disease, Hypertension, Peripheral Arterial Disease Clustered Wound: No Photos LANDIN, ENGQUIST ([id number]) [id number] Page 8 of 10 Wound Measurements Length: (cm) 2.5 Width: (cm) 3.5 Depth: (cm) 0.1 Area: (cm) 6.872 Volume: (cm) 0.687 % Reduction in Area: % Reduction in Volume: Epithelialization: None Tunneling: No Undermining: No Wound Description Classification: Unstageable/Unclassified Exudate Amount: None Present Foul Odor After Cleansing: No Slough/Fibrino  Yes Wound Bed Granulation Amount: None Present (0%) Exposed  Structure Necrotic Amount: Large (67-100%) Fascia Exposed: No Necrotic Quality: Eschar Fat Layer (Subcutaneous Tissue) Exposed: No Tendon Exposed: No Muscle Exposed: No Joint Exposed: No Bone Exposed: No Electronic Signature(s) Signed: 01/20/2023 11:59:03 AM By: Yevonne Pax RN Entered By: Yevonne Pax on 01/05/2023 07:14:03 -------------------------------------------------------------------------------- Wound Assessment Details Patient Name: Date of Service: Corey Olson, Corey Olson 01/05/2023 9:30 A M Medical Record Number: [id number] Patient Account Number: [id number] Date of Birth/Sex: Treating RN: 1955/07/04 (67 y.o. Male) Yevonne Pax Primary Care Hassell Patras: Darreld Mclean Other Clinician: Referring Abrina Petz: Treating Tinlee Navarrette/Extender: Kathreen Devoid in Treatment: 0 Wound Status Wound Number: 2 Primary Pressure Ulcer Etiology: Wound Location: Left Calcaneus Wound Open Wounding Event: Gradually Appeared Status: Date Acquired: 11/21/2022 Comorbid Chronic Obstructive Pulmonary Disease (COPD), Coronary Artery Weeks Of Treatment: 0 History: Disease, Hypertension, Peripheral Arterial Disease Clustered Wound: No Photos IREMIDE, MESEKE ([id number]) [id number] Page 9 of 10 Wound Measurements Length: (cm) 1.7 Width: (cm) 1.8 Depth: (cm) 0.1 Area: (cm) 2.403 Volume: (cm) 0.24 % Reduction in Area: % Reduction in Volume: Epithelialization: None Tunneling: No Undermining: No Wound Description Classification: Unstageable/Unclassified Exudate Amount: None Present Foul Odor After Cleansing: No Slough/Fibrino Yes Wound Bed Granulation Amount: None Present (0%) Exposed Structure Necrotic Amount: Large (67-100%) Fascia Exposed: No Necrotic Quality: Eschar Fat Layer (Subcutaneous Tissue) Exposed: Yes Tendon Exposed: No Muscle Exposed: No Joint Exposed: No Bone Exposed: No Electronic Signature(s) Signed: 01/20/2023 11:59:03 AM By: Yevonne Pax  RN Entered By: Yevonne Pax on 01/05/2023 07:15:08 -------------------------------------------------------------------------------- Vitals Details Patient Name: Date of Service: Corey Olson, Corey Olson 01/05/2023 9:30 A M Medical Record Number: [id number] Patient Account Number: [id number] Date of Birth/Sex: Treating RN: 1955-12-12 (67 y.o. Male) Yevonne Pax Primary Care Woodruff Skirvin: Darreld Mclean Other Clinician: Referring Yailine Ballard: Treating Venice Marcucci/Extender: Kathreen Devoid in Treatment: 0 Vital Signs Time Taken: 09:45 Temperature (F): 98 Height (in): 68 Pulse (bpm): 91 Source: Stated Respiratory Rate (breaths/min): 18 Weight (lbs): 160 Blood Pressure (mmHg): 160/74 Source: Stated Reference Range: 80 - 120 mg / dl Body Mass Index (BMI): 24.3 Electronic Signature(s) Signed: 01/20/2023 11:59:03 AM By: Yevonne Pax RN Entered By: Yevonne Pax on 01/05/2023 06:46:58 [name] ([id number]) [id number] Page 10 of 10

## 2023-01-20 NOTE — Progress Notes (Signed)
Corey Olson ([id number]) [id number] Page 1 of 8 Visit Report for 01/05/2023 Chief Complaint Document Details Patient Name: Date of Service: Corey Olson, Corey Olson 01/05/2023 9:30 A M Medical Record Number: [id number] Patient Account Number: [id number] Date of Birth/Sex: Treating RN: February 21, 1956 (67 y.o. Male) Yevonne Pax Primary Care Provider: Darreld Mclean Other Clinician: Referring Provider: Treating Provider/Extender: Kathreen Devoid in Treatment: 0 Information Obtained from: Patient Chief Complaint Bilateral pressure ulcers of the heels Electronic Signature(s) Signed: 01/05/2023 10:28:00 AM By: Allen Derry PA-C Entered By: Allen Derry on 01/05/2023 07:28:00 -------------------------------------------------------------------------------- HPI Details Patient Name: Date of Service: Corey Olson, Corey Olson 01/05/2023 9:30 A M Medical Record Number: [id number] Patient Account Number: [id number] Date of Birth/Sex: Treating RN: 1956-05-08 (67 y.o. Male) Yevonne Pax Primary Care Provider: Darreld Mclean Other Clinician: Referring Provider: Treating Provider/Extender: Kathreen Devoid in Treatment: 0 History of Present Illness Chronic/Inactive Conditions Condition 1: 01/05/23 patient has severe bilateral LE flow limitation with doppler evaluation and no pulses noted on palpation. He will be sent for formal vascular evaluation. HPI Description: 01-05-2023 upon evaluation today patient presents for initial inspection here in our clinic concerning issues that he is having with bilateral areas of eschar over his heels these are unstageable pressure ulcers. Unfortunately they have been present for several weeks beginning around the beginning of July 2024. This was when he was in the hospital. Nonetheless he has not had any arterial evaluation at this point his blood flow in the extremities appears to be compromised based on what we are seeing on evaluation  today. His capillary refill is greater than 3 seconds he also has cool extremities and to be honest with the area of eschar and the fact that even with Doppler he were not getting good signals and he is really not that swollen I think that he has poor arterial flow. He also has a significant history from a vascular standpoint with having a stroke on 11 June. On the 14th he had an endarterectomy endarterectomy with stent placement that subsequently ended up with a hematoma in the groin that had to be evacuated that was 200 cc of blood that was removed on 16 June. Currently they have been using Prevalon offloading boots and floating his heels he is now living with his daughter previously he was living with his sister prior to July when he came to live at his daughters. It does look like someone had ordered an ABI on 12-26-2022 but this has not been scheduled. Patient does have a history of peripheral vascular disease, coronary artery disease, COPD, and hypertension. Corey Olson ([id number]) [id number] Page 2 of 8 Electronic Signature(s) Signed: 01/05/2023 12:45:06 PM By: Allen Derry PA-C Entered By: Allen Derry on 01/05/2023 09:45:06 -------------------------------------------------------------------------------- Physical Exam Details Patient Name: Date of Service: Corey Olson, Corey Olson 01/05/2023 9:30 A M Medical Record Number: [id number] Patient Account Number: [id number] Date of Birth/Sex: Treating RN: 09-08-1955 (67 y.o. Male) Yevonne Pax Primary Care Provider: Darreld Mclean Other Clinician: Referring Provider: Treating Provider/Extender: Kathreen Devoid in Treatment: 0 Constitutional patient is hypertensive.. pulse regular and within target range for patient.Marland Kitchen respirations regular, non-labored and within target range for patient.Marland Kitchen temperature within target range for patient.. Well-nourished and well-hydrated in no acute distress. Eyes conjunctiva clear no  eyelid edema noted. pupils equal round and reactive to light and accommodation. Ears, Nose, Mouth, and Throat no gross abnormality of ear auricles or external auditory canals. normal hearing noted during conversation. mucus membranes moist. Respiratory normal breathing without difficulty. Cardiovascular Absent  posterior tibial and dorsalis pedis pulses bilateral lower extremities. Patient has greater than 3-second capillary refill in the bilateral lower extremities.. Musculoskeletal normal gait and posture. no significant deformity or arthritic changes, no loss or range of motion, no clubbing. Psychiatric this patient is able to make decisions and demonstrates good insight into disease process. Alert and Oriented x 3. pleasant and cooperative. Notes Upon inspection patient's wound bed actually showed signs of having eschar covered areas on his heels bilaterally. Again this does have me concerned about his blood flow. With that being said I do believe that he needs to be seen by vascular for arterial evaluation with ABI and TBI as soon as possible. Likely he is going require vascular intervention to see these areas heal. He does not have pulses on evaluation palpable at this point with Doppler we were able to hear some on the left but on the right was very difficult to hear much of anything. Electronic Signature(s) Signed: 01/05/2023 12:45:55 PM By: Allen Derry PA-C Entered By: Allen Derry on 01/05/2023 09:45:55 -------------------------------------------------------------------------------- Physician Orders Details Patient Name: Date of Service: Corey Olson, Corey Olson 01/05/2023 9:30 A Corey Olson, Corey Olson ([id number]) [id number] Page 3 of 8 Medical Record Number: [id number] Patient Account Number: [id number] Date of Birth/Sex: Treating RN: Jun 20, 1955 (67 y.o. Male) Yevonne Pax Primary Care Provider: Darreld Mclean Other Clinician: Referring Provider: Treating Provider/Extender:  Kathreen Devoid in Treatment: 0 Verbal / Phone Orders: No Diagnosis Coding ICD-10 Coding Code Description L89.610 Pressure ulcer of right heel, unstageable L89.620 Pressure ulcer of left heel, unstageable I73.89 Other specified peripheral vascular diseases I25.10 Atherosclerotic heart disease of native coronary artery without angina pectoris J44.89 Other specified chronic obstructive pulmonary disease I10 Essential (primary) hypertension Follow-up Appointments Return Appointment in 1 week. Home Health Home Health Company: [facility] for wound care. May utilize formulary equivalent dressing for wound treatment orders unless otherwise specified. Home Health Nurse may visit PRN to address patients wound care needs. [facility] office [phone] fax [phone] Bathing/ Shower/ Hygiene No tub bath. Edema Control - Lymphedema / Segmental Compressive Device / Other Elevate, Exercise Daily and A void Standing for Long Periods of Time. Elevate legs to the level of the heart and pump ankles as often as possible Elevate leg(s) parallel to the floor when sitting. Wound Treatment Wound #1 - Calcaneus Wound Laterality: Right Peri-Wound Care: Betadine-swabs 1 x Per Day/30 Days Discharge Instructions: Apply Betadine as directed Secondary Dressing: ABD Pad 5x9 (in/in) 1 x Per Day/30 Days Discharge Instructions: Cover with ABD pad Secondary Dressing: Kerlix 4.5 x 4.1 (in/yd) 1 x Per Day/30 Days Discharge Instructions: lightly wrap Secured With: Medipore T - 64M Medipore H Soft Cloth Surgical T ape ape, 2x2 (in/yd) 1 x Per Day/30 Days Wound #2 - Calcaneus Wound Laterality: Left Peri-Wound Care: Betadine-swabs 1 x Per Day/30 Days Discharge Instructions: Apply Betadine as directed Secondary Dressing: ABD Pad 5x9 (in/in) 1 x Per Day/30 Days Discharge Instructions: Cover with ABD pad Secondary Dressing: Kerlix 4.5 x 4.1 (in/yd) 1 x Per Day/30 Days Discharge Instructions:  lightly wrap Secured With: Medipore T - 64M Medipore H Soft Cloth Surgical T ape ape, 2x2 (in/yd) 1 x Per Day/30 Days Consults Vascular - ABI and TBI's in office screen left .31 unable to obtain right - (ICD10 L89.610 - Pressure ulcer of right heel, unstageable) Electronic Signature(s) Signed: 01/06/2023 3:00:55 PM By: Allen Derry PA-C Signed: 01/20/2023 11:59:03 AM By: Yevonne Pax RN Entered By: Yevonne Pax on 01/05/2023 07:57:44 Kahan, Corey Olson ([id number])  [id number] Page 4 of 8 -------------------------------------------------------------------------------- Problem List Details Patient Name: Date of Service: Corey Olson, Corey Olson 01/05/2023 9:30 A M Medical Record Number: [id number] Patient Account Number: [id number] Date of Birth/Sex: Treating RN: August 15, 1955 (67 y.o. Male) Yevonne Pax Primary Care Provider: Darreld Mclean Other Clinician: Referring Provider: Treating Provider/Extender: Kathreen Devoid in Treatment: 0 Active Problems ICD-10 Encounter Code Description Active Date MDM Diagnosis L89.610 Pressure ulcer of right heel, unstageable 01/05/2023 No Yes L89.620 Pressure ulcer of left heel, unstageable 01/05/2023 No Yes I73.89 Other specified peripheral vascular diseases 01/05/2023 No Yes I25.10 Atherosclerotic heart disease of native coronary artery without angina pectoris 01/05/2023 No Yes J44.89 Other specified chronic obstructive pulmonary disease 01/05/2023 No Yes I10 Essential (primary) hypertension 01/05/2023 No Yes Inactive Problems Resolved Problems Electronic Signature(s) Signed: 01/05/2023 10:26:34 AM By: Allen Derry PA-C Entered By: Allen Derry on 01/05/2023 07:26:34 Progress Note Details -------------------------------------------------------------------------------- Corey Olson ([id number]) [id number] Page 5 of 8 Patient Name: Date of Service: Corey Olson, Corey Olson 01/05/2023 9:30 A M Medical Record Number:  [id number] Patient Account Number: [id number] Date of Birth/Sex: Treating RN: 04-Feb-1956 (67 y.o. Male) Yevonne Pax Primary Care Provider: Darreld Mclean Other Clinician: Referring Provider: Treating Provider/Extender: Kathreen Devoid in Treatment: 0 Subjective Chief Complaint Information obtained from Patient Bilateral pressure ulcers of the heels History of Present Illness (HPI) Chronic/Inactive Condition: 01/05/23 patient has severe bilateral LE flow limitation with doppler evaluation and no pulses noted on palpation. He will be sent for formal vascular evaluation. 01-05-2023 upon evaluation today patient presents for initial inspection here in our clinic concerning issues that he is having with bilateral areas of eschar over his heels these are unstageable pressure ulcers. Unfortunately they have been present for several weeks beginning around the beginning of July 2024. This was when he was in the hospital. Nonetheless he has not had any arterial evaluation at this point his blood flow in the extremities appears to be compromised based on what we are seeing on evaluation today. His capillary refill is greater than 3 seconds he also has cool extremities and to be honest with the area of eschar and the fact that even with Doppler he were not getting good signals and he is really not that swollen I think that he has poor arterial flow. He also has a significant history from a vascular standpoint with having a stroke on 11 June. On the 14th he had an endarterectomy endarterectomy with stent placement that subsequently ended up with a hematoma in the groin that had to be evacuated that was 200 cc of blood that was removed on 16 June. Currently they have been using Prevalon offloading boots and floating his heels he is now living with his daughter previously he was living with his sister prior to July when he came to live at his daughters. It does look like someone had ordered an ABI  on 12-26-2022 but this has not been scheduled. Patient does have a history of peripheral vascular disease, coronary artery disease, COPD, and hypertension. Patient History Information obtained from Patient, Caregiver. Allergies No Known Allergies Social History Current every day smoker, Marital Status - Single, Alcohol Use - Never, Drug Use - No History, Caffeine Use - Never. Medical History Respiratory Patient has history of Chronic Obstructive Pulmonary Disease (COPD) Cardiovascular Patient has history of Coronary Artery Disease, Hypertension, Peripheral Arterial Disease Objective Constitutional patient is hypertensive.. pulse regular and within target range for patient.Marland Kitchen respirations regular, non-labored and within target range for patient.Marland Kitchen temperature within target range  for patient.. Well-nourished and well-hydrated in no acute distress. Vitals Time Taken: 9:45 AM, Height: 68 in, Source: Stated, Weight: 160 lbs, Source: Stated, BMI: 24.3, Temperature: 98 F, Pulse: 91 bpm, Respiratory Rate: 18 breaths/min, Blood Pressure: 160/74 mmHg. Eyes conjunctiva clear no eyelid edema noted. pupils equal round and reactive to light and accommodation. Ears, Nose, Mouth, and Throat no gross abnormality of ear auricles or external auditory canals. normal hearing noted during conversation. mucus membranes moist. Respiratory normal breathing without difficulty. Cardiovascular Absent posterior tibial and dorsalis pedis pulses bilateral lower extremities. Patient has greater than 3-second capillary refill in the bilateral lower extremities.. Musculoskeletal normal gait and posture. no significant deformity or arthritic changes, no loss or range of motion, no clubbing. Psychiatric this patient is able to make decisions and demonstrates good insight into disease process. Alert and Oriented x 3. pleasant and cooperative. General Notes: Upon inspection patient's wound bed actually showed signs of having  eschar covered areas on his heels bilaterally. Again this does have me concerned about his blood flow. With that being said I do believe that he needs to be seen by vascular for arterial evaluation with ABI and TBI as soon as Corey Olson, Corey Olson ([id number]) [id number] Page 6 of 8 possible. Likely he is going require vascular intervention to see these areas heal. He does not have pulses on evaluation palpable at this point with Doppler we were able to hear some on the left but on the right was very difficult to hear much of anything. Integumentary (Hair, Skin) Wound #1 status is Open. Original cause of wound was Gradually Appeared. The date acquired was: 11/21/2022. The wound is located on the Right Calcaneus. The wound measures 2.5cm length x 3.5cm width x 0.1cm depth; 6.872cm^2 area and 0.687cm^3 volume. There is no tunneling or undermining noted. There is a none present amount of drainage noted. There is no granulation within the wound bed. There is a large (67-100%) amount of necrotic tissue within the wound bed including Eschar. Wound #2 status is Open. Original cause of wound was Gradually Appeared. The date acquired was: 11/21/2022. The wound is located on the Left Calcaneus. The wound measures 1.7cm length x 1.8cm width x 0.1cm depth; 2.403cm^2 area and 0.24cm^3 volume. There is Fat Layer (Subcutaneous Tissue) exposed. There is no tunneling or undermining noted. There is a none present amount of drainage noted. There is no granulation within the wound bed. There is a large (67-100%) amount of necrotic tissue within the wound bed including Eschar. Assessment Active Problems ICD-10 Pressure ulcer of right heel, unstageable Pressure ulcer of left heel, unstageable Other specified peripheral vascular diseases Atherosclerotic heart disease of native coronary artery without angina pectoris Other specified chronic obstructive pulmonary disease Essential (primary)  hypertension Plan Follow-up Appointments: Return Appointment in 1 week. Home Health: Home Health Company: [facility] for wound care. May utilize formulary equivalent dressing for wound treatment orders unless otherwise specified. Home Health Nurse may visit PRN to address patients wound care needs. [facility] office [phone] fax [phone] Bathing/ Shower/ Hygiene: No tub bath. Edema Control - Lymphedema / Segmental Compressive Device / Other: Elevate, Exercise Daily and Avoid Standing for Long Periods of Time. Elevate legs to the level of the heart and pump ankles as often as possible Elevate leg(s) parallel to the floor when sitting. Consults ordered were: Vascular - ABI and TBI's in office screen left .31 unable to obtain right WOUND #1: - Calcaneus Wound Laterality: Right Peri-Wound Care: Betadine-swabs 1 x Per Day/30  Days Discharge Instructions: Apply Betadine as directed Secondary Dressing: ABD Pad 5x9 (in/in) 1 x Per Day/30 Days Discharge Instructions: Cover with ABD pad Secondary Dressing: Kerlix 4.5 x 4.1 (in/yd) 1 x Per Day/30 Days Discharge Instructions: lightly wrap Secured With: Medipore T - 24M Medipore H Soft Cloth Surgical T ape ape, 2x2 (in/yd) 1 x Per Day/30 Days WOUND #2: - Calcaneus Wound Laterality: Left Peri-Wound Care: Betadine-swabs 1 x Per Day/30 Days Discharge Instructions: Apply Betadine as directed Secondary Dressing: ABD Pad 5x9 (in/in) 1 x Per Day/30 Days Discharge Instructions: Cover with ABD pad Secondary Dressing: Kerlix 4.5 x 4.1 (in/yd) 1 x Per Day/30 Days Discharge Instructions: lightly wrap Secured With: Medipore T - 24M Medipore H Soft Cloth Surgical T ape ape, 2x2 (in/yd) 1 x Per Day/30 Days 1. Based on what I am seeing I do believe that the patient has peripheral vascular compromise and I do believe that he needs to be seen by vascular for arterial studies with ABI and TBI. 2 I am also can recommend that we try to see about get  the patient scheduled for this is soon as possible. 3. I am also going to recommend that we have the patient continue with the Betadine followed by ABD pad and then Kerlix lightly wrapped in order to secure. Will see the patient for evaluation in 1 week. Electronic Signature(s) Signed: 01/05/2023 12:46:33 PM By: Allen Derry PA-C Entered By: Allen Derry on 01/05/2023 09:46:33 Corey Olson ([id number]) [id number] Page 7 of 8 -------------------------------------------------------------------------------- ROS/PFSH Details Patient Name: Date of Service: Corey Olson, Corey Olson 01/05/2023 9:30 A M Medical Record Number: [id number] Patient Account Number: [id number] Date of Birth/Sex: Treating RN: September 16, 1955 (67 y.o. Male) Yevonne Pax Primary Care Provider: Darreld Mclean Other Clinician: Referring Provider: Treating Provider/Extender: Kathreen Devoid in Treatment: 0 Information Obtained From Patient Caregiver Respiratory Medical History: Positive for: Chronic Obstructive Pulmonary Disease (COPD) Cardiovascular Medical History: Positive for: Coronary Artery Disease; Hypertension; Peripheral Arterial Disease Immunizations Pneumococcal Vaccine: Received Pneumococcal Vaccination: No Implantable Devices None Family and Social History Current every day smoker; Marital Status - Single; Alcohol Use: Never; Drug Use: No History; Caffeine Use: Never Electronic Signature(s) Signed: 01/06/2023 3:00:55 PM By: Allen Derry PA-C Signed: 01/20/2023 11:59:03 AM By: Yevonne Pax RN Entered By: Yevonne Pax on 01/05/2023 06:50:34 -------------------------------------------------------------------------------- SuperBill Details Patient Name: Date of Service: Corey Olson, Corey Olson 01/05/2023 Medical Record Number: [id number] Patient Account Number: [id number] Date of Birth/Sex: Treating RN: 1955/10/09 (67 y.o. Male) Yevonne Pax Primary Care Provider: Darreld Mclean Other  Clinician: Referring Provider: Treating Provider/Extender: Kathreen Devoid in Treatment: 0 Diagnosis Coding Corey Olson ([id number]) [id number] Page 8 of 8 ICD-10 Codes Code Description L89.610 Pressure ulcer of right heel, unstageable L89.620 Pressure ulcer of left heel, unstageable I73.89 Other specified peripheral vascular diseases I25.10 Atherosclerotic heart disease of native coronary artery without angina pectoris J44.89 Other specified chronic obstructive pulmonary disease I10 Essential (primary) hypertension Facility Procedures : CPT4 Code: [id number] Description: 99213 - WOUND CARE VISIT-LEV 3 EST PT Modifier: Quantity: 1 Physician Procedures : CPT4 Code Description Modifier [id number] - WC PHYS LEVEL 4 - NEW PT ICD-10 Diagnosis Description L89.620 Pressure ulcer of left heel, unstageable L89.610 Pressure ulcer of right heel, unstageable I73.89 Other specified peripheral vascular diseases  I25.10 Atherosclerotic heart disease of native coronary artery without angina pectoris Quantity: 1 Electronic Signature(s) Signed: 01/05/2023 12:48:07 PM By: Allen Derry PA-C Previous Signature: 01/05/2023 12:47:32 PM Version By: Allen Derry PA-C Previous Signature: 01/05/2023 10:58:59 AM Version By: Jettie Pagan  Carrie RN Entered By: Allen Derry on 01/05/2023 09:48:06

## 2023-01-20 NOTE — Progress Notes (Signed)
Corey Olson, Corey Olson ([id number]) [phone] [email or web address] Page 1 of 5 Visit Report for 01/05/2023 Abuse Risk Screen Details Patient Name: Date of Service: Corey Olson, Corey Olson 01/05/2023 9:30 A M Medical Record Number: [id number] Patient Account Number: [id number] Date of Birth/Sex: Treating RN: [birth year] (67 y.o. Male) Yevonne Pax Primary Care Inette Doubrava: Darreld Mclean Other Clinician: Referring Seletha Zimmermann: Treating Merina Behrendt/Extender: Kathreen Devoid in Treatment: 0 Abuse Risk Screen Items Answer ABUSE RISK SCREEN: Has anyone close to you tried to hurt or harm you recentlyo No Do you feel uncomfortable with anyone in your familyo No Has anyone forced you do things that you didnt want to doo No Electronic Signature(s) Signed: 01/20/2023 11:59:03 AM By: Yevonne Pax RN Entered By: Yevonne Pax on 01/05/2023 06:50:41 -------------------------------------------------------------------------------- Activities of Daily Living Details Patient Name: Date of Service: Corey Olson, Corey Olson 01/05/2023 9:30 A M Medical Record Number: [id number] Patient Account Number: [id number] Date of Birth/Sex: Treating RN: 07-14-1955 (67 y.o. Male) Yevonne Pax Primary Care Windell Musson: Darreld Mclean Other Clinician: Referring Jonesha Tsuchiya: Treating Marvalene Barrett/Extender: Kathreen Devoid in Treatment: 0 Activities of Daily Living Items Answer Activities of Daily Living (Please select one for each item) Drive Automobile Not Able T Medications ake Need Assistance Use T elephone Need Assistance Care for Appearance Need Assistance Use T oilet Need Assistance Bath / Shower Need Assistance Dress Self Need Assistance Feed Self Need Assistance Walk Not Able Get In / Out Bed Need Assistance Housework Not [place], [place] ([id number]) [phone] [email or web address] Page 2 of 5 Prepare Meals Not Able Handle Money Not Able Shop for Self Not Able Electronic  Signature(s) Signed: 01/20/2023 11:59:03 AM By: Yevonne Pax RN Entered By: Yevonne Pax on 01/05/2023 06:51:28 -------------------------------------------------------------------------------- Education Screening Details Patient Name: Date of Service: Corey Olson, Corey Olson 01/05/2023 9:30 A M Medical Record Number: [id number] Patient Account Number: [id number] Date of Birth/Sex: Treating RN: 07-Sep-1955 (67 y.o. Male) Yevonne Pax Primary Care Talmadge Ganas: Darreld Mclean Other Clinician: Referring Dylan Ruotolo: Treating Jhonathan Desroches/Extender: Kathreen Devoid in Treatment: 0 Primary Learner Assessed: Patient Learning Preferences/Education Level/Primary Language Learning Preference: Explanation Highest Education Level: High School Preferred Language: English Cognitive Barrier Language Barrier: No Translator Needed: No Memory Deficit: No Emotional Barrier: No Cultural/Religious Beliefs Affecting Medical Care: No Physical Barrier Impaired Vision: Yes Glasses Impaired Hearing: No Decreased Hand dexterity: No Knowledge/Comprehension Knowledge Level: Medium Comprehension Level: High Ability to understand written instructions: High Ability to understand verbal instructions: High Motivation Anxiety Level: Anxious Cooperation: Cooperative Education Importance: Acknowledges Need Interest in Health Problems: Asks Questions Perception: Coherent Willingness to Engage in Self-Management High Activities: Readiness to Engage in Self-Management High Activities: Electronic Signature(s) Signed: 01/20/2023 11:59:03 AM By: Yevonne Pax RN Entered By: Yevonne Pax on 01/05/2023 06:52:52 Corey Olson ([id number]) [id number] Page 3 of 5 -------------------------------------------------------------------------------- Fall Risk Assessment Details Patient Name: Date of Service: Corey Olson, Corey Olson 01/05/2023 9:30 A M Medical Record Number: [id number] Patient Account Number:  [id number] Date of Birth/Sex: Treating RN: Jun 04, 1955 (67 y.o. Male) Yevonne Pax Primary Care Deborra Phegley: Darreld Mclean Other Clinician: Referring Zafir Schauer: Treating Maddalena Linarez/Extender: Kathreen Devoid in Treatment: 0 Fall Risk Assessment Items Have you had 2 or more falls in the last 12 monthso 0 Yes Have you had any fall that resulted in injury in the last 12 monthso 0 Yes FALLS RISK SCREEN History of falling - immediate or within 3 months 0 No Secondary diagnosis (Do you have 2 or more medical diagnoseso) 0 No Ambulatory aid None/bed rest/wheelchair/nurse 0 Yes Crutches/cane/walker 0 No Furniture 0 No Intravenous therapy Access/Saline/Heparin  Lock 0 No Gait/Transferring Normal/ bed rest/ wheelchair 0 Yes Weak (short steps with or without shuffle, stooped but able to lift head while walking, may seek 0 No support from furniture) Impaired (short steps with shuffle, may have difficulty arising from chair, head down, impaired 0 No balance) Mental Status Oriented to own ability 0 Yes Electronic Signature(s) Signed: 01/20/2023 11:59:03 AM By: Yevonne Pax RN Entered By: Yevonne Pax on 01/05/2023 06:53:06 -------------------------------------------------------------------------------- Foot Assessment Details Patient Name: Date of Service: Corey Olson, Corey Olson 01/05/2023 9:30 A M Medical Record Number: [id number] Patient Account Number: [id number] Date of Birth/Sex: Treating RN: [birth year] (67 y.o. Male) Yevonne Pax Primary Care Hansford Hirt: Darreld Mclean Other Clinician: Referring Thania Woodlief: Treating Maddisen Vought/Extender: Kathreen Devoid in Treatment: 0 Foot Assessment Items [x]  Unable to perform due to altered mental status Site Locations [place], [place] ([id number]) [phone] [id number] Page 4 of 5 + = Sensation present, - = Sensation absent, C = Callus, U = Ulcer R = Redness, W = Warmth, M = Maceration, PU = Pre-ulcerative lesion F =  Fissure, S = Swelling, D = Dryness Assessment Right: Left: Other Deformity: No No Prior Foot Ulcer: No No Prior Amputation: No No Charcot Joint: No No Ambulatory Status: Non-ambulatory Assistance Device: Wheelchair Gait: Psychologist, prison and probation services) Signed: 01/20/2023 11:59:03 AM By: Yevonne Pax RN Entered By: Yevonne Pax on 01/05/2023 07:10:39 -------------------------------------------------------------------------------- Nutrition Risk Screening Details Patient Name: Date of Service: Corey Olson, Corey Olson 01/05/2023 9:30 A M Medical Record Number: [id number] Patient Account Number: [id number] Date of Birth/Sex: Treating RN: 01-08-1956 (67 y.o. Male) Yevonne Pax Primary Care Dejane Scheibe: Darreld Mclean Other Clinician: Referring Johnchristopher Sarvis: Treating Kadeshia Kasparian/Extender: Kathreen Devoid in Treatment: 0 Height (in): 68 Weight (lbs): 160 Body Mass Index (BMI): 24.3 Nutrition Risk Screening Items Score Screening NUTRITION RISK SCREEN: I have an illness or condition that made me change the kind and/or amount of food I eat 0 No I eat fewer than two meals per day 0 No I eat few fruits and vegetables, or milk products 0 No I have three or more drinks of beer, liquor or wine almost every day 0 No I have tooth or mouth problems that make it hard for me to eat 0 No LUISA, DASARI ([id number]) [id number] Page 5 of 5 I don't always have enough money to buy the food I need 0 No I eat alone most of the time 0 No I take three or more different prescribed or over-the-counter drugs a day 1 Yes Without wanting to, I have lost or gained 10 pounds in the last six months 0 No I am not always physically able to shop, cook and/or feed myself 2 Yes Nutrition Protocols Good Risk Protocol Moderate Risk Protocol 0 Provide education on nutrition High Risk Proctocol Risk Level: Moderate Risk Score: 3 Electronic Signature(s) Signed: 01/20/2023 11:59:03 AM By: Yevonne Pax  RN Entered By: Yevonne Pax on 01/05/2023 06:53:30

## 2023-01-26 DIAGNOSIS — L97909 Non-pressure chronic ulcer of unspecified part of unspecified lower leg with unspecified severity: Secondary | ICD-10-CM

## 2023-02-03 ENCOUNTER — Ambulatory Visit: Payer: Medicare Other | Admitting: Physician Assistant

## 2023-02-08 ENCOUNTER — Telehealth (INDEPENDENT_AMBULATORY_CARE_PROVIDER_SITE_OTHER): Payer: Self-pay

## 2023-02-08 NOTE — Telephone Encounter (Signed)
Spoke with the patient's daughter and he has been rescheduled to 02/10/23 with a 11:00 am arrival time to the [facility]. Patient's daughter understood and a new pre-procedure sheet will  be sent to Mychart.

## 2023-02-09 DIAGNOSIS — L97909 Non-pressure chronic ulcer of unspecified part of unspecified lower leg with unspecified severity: Secondary | ICD-10-CM

## 2023-02-10 ENCOUNTER — Telehealth (INDEPENDENT_AMBULATORY_CARE_PROVIDER_SITE_OTHER): Payer: Self-pay

## 2023-02-10 DIAGNOSIS — L97909 Non-pressure chronic ulcer of unspecified part of unspecified lower leg with unspecified severity: Secondary | ICD-10-CM

## 2023-02-10 NOTE — Telephone Encounter (Signed)
I attempted to contact the daughter from this morning when she left a message for the patient to be rescheduled from today for his LLE angio with Dr. Wyn Quaker. Patient will be moved to 02/20/23. Patient's daughter's voicemail box is full so unable to leave a message.

## 2023-02-13 NOTE — Telephone Encounter (Signed)
Spoke with the patient's daughter and he has been rescheduled from 02/10/23 to 02/20/23 with a 8:30 am arrival time to the [facility]. Pre-procedure instructions were discussed and patient's daughter stated she understood.

## 2023-02-15 ENCOUNTER — Other Ambulatory Visit (INDEPENDENT_AMBULATORY_CARE_PROVIDER_SITE_OTHER): Payer: Self-pay | Admitting: Vascular Surgery

## 2023-02-15 DIAGNOSIS — I739 Peripheral vascular disease, unspecified: Secondary | ICD-10-CM

## 2023-02-20 ENCOUNTER — Ambulatory Visit
Admission: RE | Admit: 2023-02-20 | Discharge: 2023-02-20 | Disposition: A | Discharge status: Home / Self Care | Payer: Medicare Other | Source: Ambulatory Visit | Attending: Vascular Surgery | Admitting: Vascular Surgery

## 2023-02-20 ENCOUNTER — Ambulatory Visit: Payer: Medicare Other | Admitting: Anesthesiology

## 2023-02-20 ENCOUNTER — Encounter (INDEPENDENT_AMBULATORY_CARE_PROVIDER_SITE_OTHER): Payer: Medicare Other

## 2023-02-20 ENCOUNTER — Other Ambulatory Visit: Payer: Self-pay

## 2023-02-20 ENCOUNTER — Ambulatory Visit (INDEPENDENT_AMBULATORY_CARE_PROVIDER_SITE_OTHER): Payer: Medicare Other | Admitting: Nurse Practitioner

## 2023-02-20 ENCOUNTER — Encounter
Admission: RE | Disposition: A | Discharge status: Home / Self Care | Payer: Self-pay | Source: Ambulatory Visit | Attending: Vascular Surgery

## 2023-02-20 DIAGNOSIS — L97829 Non-pressure chronic ulcer of other part of left lower leg with unspecified severity: Secondary | ICD-10-CM | POA: Diagnosis not present

## 2023-02-20 DIAGNOSIS — I70249 Atherosclerosis of native arteries of left leg with ulceration of unspecified site: Secondary | ICD-10-CM | POA: Diagnosis not present

## 2023-02-20 DIAGNOSIS — E785 Hyperlipidemia, unspecified: Secondary | ICD-10-CM | POA: Insufficient documentation

## 2023-02-20 DIAGNOSIS — L97919 Non-pressure chronic ulcer of unspecified part of right lower leg with unspecified severity: Secondary | ICD-10-CM | POA: Diagnosis not present

## 2023-02-20 DIAGNOSIS — I70213 Atherosclerosis of native arteries of extremities with intermittent claudication, bilateral legs: Secondary | ICD-10-CM | POA: Diagnosis present

## 2023-02-20 DIAGNOSIS — Z9889 Other specified postprocedural states: Secondary | ICD-10-CM

## 2023-02-20 DIAGNOSIS — Z95828 Presence of other vascular implants and grafts: Secondary | ICD-10-CM | POA: Diagnosis not present

## 2023-02-20 DIAGNOSIS — L97819 Non-pressure chronic ulcer of other part of right lower leg with unspecified severity: Secondary | ICD-10-CM | POA: Diagnosis not present

## 2023-02-20 DIAGNOSIS — Z79899 Other long term (current) drug therapy: Secondary | ICD-10-CM | POA: Insufficient documentation

## 2023-02-20 DIAGNOSIS — I70248 Atherosclerosis of native arteries of left leg with ulceration of other part of lower left leg: Secondary | ICD-10-CM | POA: Insufficient documentation

## 2023-02-20 DIAGNOSIS — I70238 Atherosclerosis of native arteries of right leg with ulceration of other part of lower right leg: Secondary | ICD-10-CM | POA: Diagnosis not present

## 2023-02-20 DIAGNOSIS — I70239 Atherosclerosis of native arteries of right leg with ulceration of unspecified site: Secondary | ICD-10-CM

## 2023-02-20 DIAGNOSIS — L97929 Non-pressure chronic ulcer of unspecified part of left lower leg with unspecified severity: Secondary | ICD-10-CM | POA: Diagnosis not present

## 2023-02-20 DIAGNOSIS — L97909 Non-pressure chronic ulcer of unspecified part of unspecified lower leg with unspecified severity: Secondary | ICD-10-CM

## 2023-02-20 DIAGNOSIS — F17210 Nicotine dependence, cigarettes, uncomplicated: Secondary | ICD-10-CM | POA: Diagnosis not present

## 2023-02-20 DIAGNOSIS — I10 Essential (primary) hypertension: Secondary | ICD-10-CM | POA: Diagnosis not present

## 2023-02-20 HISTORY — PX: LOWER EXTREMITY ANGIOGRAPHY: CATH118251

## 2023-02-20 LAB — CREATININE, SERUM
Creatinine, Ser: 1.02 mg/dL (ref 0.61–1.24)
GFR, Estimated: >60 mL/min (ref >60)

## 2023-02-20 LAB — BUN: BUN: 18 mg/dL (ref 8–23)

## 2023-02-20 SURGERY — LOWER EXTREMITY ANGIOGRAPHY
Anesthesia: General | Site: Leg Lower | Laterality: Left

## 2023-02-20 MED ORDER — EPHEDRINE SULFATE (PRESSORS) 50 MG/ML IJ SOLN
INTRAMUSCULAR | Status: DC | PRN
  Administered 2023-02-20: 5 mg via INTRAVENOUS

## 2023-02-20 MED ORDER — HEPARIN SODIUM (PORCINE) 1000 UNIT/ML IJ SOLN
INTRAMUSCULAR | Status: DC | PRN
Start: 2023-02-20 — End: 2023-02-20
  Administered 2023-02-20: 5000 [IU] via INTRAVENOUS

## 2023-02-20 MED ORDER — MIDAZOLAM HCL 2 MG/2ML IJ SOLN
INTRAMUSCULAR | Status: AC
  Filled 2023-02-20: qty 2

## 2023-02-20 MED ORDER — SODIUM CHLORIDE 0.9% FLUSH: 3.0000 mL | Freq: Two times a day (BID) | INTRAVENOUS | Status: DC

## 2023-02-20 MED ORDER — LABETALOL HCL 5 MG/ML IV SOLN: 10.0000 mg | INTRAVENOUS | Status: DC | PRN

## 2023-02-20 MED ORDER — IODIXANOL 320 MG/ML IV SOLN
INTRAVENOUS | Status: DC | PRN
  Administered 2023-02-20: 40 mL via INTRA_ARTERIAL

## 2023-02-20 MED ORDER — PROPOFOL 10 MG/ML IV BOLUS
INTRAVENOUS | Status: AC
  Filled 2023-02-20: qty 20

## 2023-02-20 MED ORDER — SODIUM CHLORIDE 0.9 % IV SOLN: 250.0000 mL | INTRAVENOUS | Status: DC | PRN

## 2023-02-20 MED ORDER — DEXAMETHASONE SODIUM PHOSPHATE 10 MG/ML IJ SOLN
INTRAMUSCULAR | Status: DC | PRN
  Administered 2023-02-20: 10 mg via INTRAVENOUS

## 2023-02-20 MED ORDER — HEPARIN (PORCINE) IN NACL 1000-0.9 UT/500ML-% IV SOLN
INTRAVENOUS | Status: DC | PRN
  Administered 2023-02-20: 1000 mL

## 2023-02-20 MED ORDER — PROPOFOL 500 MG/50ML IV EMUL
INTRAVENOUS | Status: DC | PRN
  Administered 2023-02-20: 155 ug/kg/min via INTRAVENOUS

## 2023-02-20 MED ORDER — METHYLPREDNISOLONE SODIUM SUCC 125 MG IJ SOLR: 125.0000 mg | Freq: Once | INTRAMUSCULAR | Status: DC | PRN

## 2023-02-20 MED ORDER — LIDOCAINE-EPINEPHRINE (PF) 1 %-1:200000 IJ SOLN
INTRAMUSCULAR | Status: DC | PRN
  Administered 2023-02-20 (×2): 10 mL via INTRADERMAL

## 2023-02-20 MED ORDER — HYDROMORPHONE HCL 1 MG/ML IJ SOLN: 1.0000 mg | Freq: Once | INTRAMUSCULAR | Status: DC | PRN

## 2023-02-20 MED ORDER — MIDAZOLAM HCL 2 MG/2ML IJ SOLN
INTRAMUSCULAR | Status: DC | PRN
  Administered 2023-02-20: 2 mg via INTRAVENOUS

## 2023-02-20 MED ORDER — FAMOTIDINE 20 MG PO TABS: 40.0000 mg | ORAL_TABLET | Freq: Once | ORAL | Status: DC | PRN

## 2023-02-20 MED ORDER — PHENYLEPHRINE 80 MCG/ML (10ML) SYRINGE FOR IV PUSH (FOR BLOOD PRESSURE SUPPORT)
PREFILLED_SYRINGE | INTRAVENOUS | Status: DC | PRN
  Administered 2023-02-20 (×2): 80 ug via INTRAVENOUS
  Administered 2023-02-20: 160 ug via INTRAVENOUS

## 2023-02-20 MED ORDER — FENTANYL CITRATE (PF) 100 MCG/2ML IJ SOLN
INTRAMUSCULAR | Status: DC | PRN
  Administered 2023-02-20 (×4): 25 ug via INTRAVENOUS

## 2023-02-20 MED ORDER — SODIUM CHLORIDE 0.9 % IV SOLN: INTRAVENOUS | Status: DC

## 2023-02-20 MED ORDER — KETAMINE HCL 10 MG/ML IJ SOLN
INTRAMUSCULAR | Status: DC | PRN
Start: 2023-02-20 — End: 2023-02-20
  Administered 2023-02-20 (×2): 20 mg via INTRAVENOUS
  Administered 2023-02-20: 10 mg via INTRAVENOUS

## 2023-02-20 MED ORDER — KETAMINE HCL 50 MG/5ML IJ SOSY
PREFILLED_SYRINGE | INTRAMUSCULAR | Status: AC
  Filled 2023-02-20: qty 5

## 2023-02-20 MED ORDER — ONDANSETRON HCL 4 MG/2ML IJ SOLN: 4.0000 mg | Freq: Four times a day (QID) | INTRAMUSCULAR | Status: DC | PRN

## 2023-02-20 MED ORDER — CEFAZOLIN SODIUM-DEXTROSE 2-4 GM/100ML-% IV SOLN
2.0000 g | INTRAVENOUS | Status: AC
  Administered 2023-02-20: 2 g via INTRAVENOUS

## 2023-02-20 MED ORDER — MIDAZOLAM HCL 2 MG/ML PO SYRP: 8.0000 mg | ORAL_SOLUTION | Freq: Once | ORAL | Status: DC | PRN

## 2023-02-20 MED ORDER — PROPOFOL 10 MG/ML IV BOLUS
INTRAVENOUS | Status: DC | PRN
  Administered 2023-02-20: 20 mg via INTRAVENOUS
  Administered 2023-02-20: 30 mg via INTRAVENOUS

## 2023-02-20 MED ORDER — HYDRALAZINE HCL 20 MG/ML IJ SOLN: 5.0000 mg | INTRAMUSCULAR | Status: DC | PRN

## 2023-02-20 MED ORDER — ONDANSETRON HCL 4 MG/2ML IJ SOLN
INTRAMUSCULAR | Status: DC | PRN
  Administered 2023-02-20 (×2): 4 mg via INTRAVENOUS

## 2023-02-20 MED ORDER — PROPOFOL 1000 MG/100ML IV EMUL
INTRAVENOUS | Status: AC
  Filled 2023-02-20: qty 100

## 2023-02-20 MED ORDER — DIPHENHYDRAMINE HCL 50 MG/ML IJ SOLN: 50.0000 mg | Freq: Once | INTRAMUSCULAR | Status: DC | PRN

## 2023-02-20 MED ORDER — GLYCOPYRROLATE 0.2 MG/ML IJ SOLN
INTRAMUSCULAR | Status: DC | PRN
Start: 2023-02-20 — End: 2023-02-20
  Administered 2023-02-20: .2 mg via INTRAVENOUS

## 2023-02-20 MED ORDER — CEFAZOLIN SODIUM-DEXTROSE 2-4 GM/100ML-% IV SOLN
INTRAVENOUS | Status: AC
  Filled 2023-02-20: qty 100

## 2023-02-20 MED ORDER — SODIUM CHLORIDE 0.9% FLUSH: 3.0000 mL | INTRAVENOUS | Status: DC | PRN

## 2023-02-20 MED ORDER — HEPARIN (PORCINE) IN NACL 2000-0.9 UNIT/L-% IV SOLN
INTRAVENOUS | Status: DC | PRN
  Administered 2023-02-20: 1000 mL

## 2023-02-20 MED ORDER — FENTANYL CITRATE (PF) 100 MCG/2ML IJ SOLN
INTRAMUSCULAR | Status: AC
  Filled 2023-02-20: qty 2

## 2023-02-20 MED ORDER — ACETAMINOPHEN 325 MG PO TABS: 650.0000 mg | ORAL_TABLET | ORAL | Status: DC | PRN

## 2023-02-20 SURGICAL SUPPLY — 33 items
BALLN DORADO 5X200X135 (BALLOONS) ×1
BALLN DORADO 6X200X135 (BALLOONS) ×1
BALLN DORADO 8X60X80 (BALLOONS) ×1
BALLN LUTONIX 018 5X220X130 (BALLOONS) ×1
BALLN LUTONIX 018 5X300X130 (BALLOONS) ×1
BALLN LUTONIX 7X80X130 (BALLOONS) ×1
BALLN LUTONIX DCB 4X100X130 (BALLOONS) ×1
BALLN ULTRVRSE 2.5X300X150 (BALLOONS) ×1
BALLOON DORADO 5X200X135 (BALLOONS) IMPLANT
BALLOON DORADO 6X200X135 (BALLOONS) IMPLANT
BALLOON DORADO 8X60X80 (BALLOONS) IMPLANT
BALLOON LUTONIX 018 5X220X130 (BALLOONS) IMPLANT
BALLOON LUTONIX 018 5X300X130 (BALLOONS) IMPLANT
BALLOON LUTONIX 7X80X130 (BALLOONS) IMPLANT
BALLOON LUTONIX DCB 4X100X130 (BALLOONS) IMPLANT
BALLOON ULTRVRSE 2.5X300X150 (BALLOONS) IMPLANT
CATH BEACON 5 .038 100 VERT TP (CATHETERS) IMPLANT
CATH NAVICROSS ANGLED 135CM (MICROCATHETER) IMPLANT
CATH TEMPO 5F RIM 65CM (CATHETERS) IMPLANT
COVER PROBE ULTRASOUND 5X96 (MISCELLANEOUS) IMPLANT
DEVICE STARCLOSE SE CLOSURE (Vascular Products) IMPLANT
GLIDEWIRE ADV .035X260CM (WIRE) IMPLANT
KIT ENCORE 26 ADVANTAGE (KITS) IMPLANT
PACK ANGIOGRAPHY (CUSTOM PROCEDURE TRAY) ×1 IMPLANT
SHEATH ANL2 6FRX45 HC (SHEATH) IMPLANT
SHEATH BRITE TIP 5FRX11 (SHEATH) IMPLANT
SHEATH FLEXOR ANSEL2 7FRX45 (SHEATH) IMPLANT
STENT VIABAHN 6X250X120 (Permanent Stent) IMPLANT
STENT VIABAHN 8X7.5X120 (Permanent Stent) IMPLANT
SYR MEDRAD MARK 7 150ML (SYRINGE) IMPLANT
TUBING CONTRAST HIGH PRESS 72 (TUBING) IMPLANT
WIRE G V18X300CM (WIRE) IMPLANT
WIRE GUIDERIGHT .035X150 (WIRE) IMPLANT

## 2023-02-20 NOTE — Anesthesia Preprocedure Evaluation (Addendum)
Anesthesia Evaluation  Patient identified by MRN, date of birth, ID band Patient confused  General Assessment Comment:  Patient AO x 1 to me (only oriented to self, did not know his birthday).  Consent obtained from daughter [name] by phone. Per daughter, this is his baseline  Reviewed: Allergy & Precautions, NPO status , Patient's Chart, lab work & pertinent test results  History of Anesthesia Complications Negative for: history of anesthetic complications  Airway Mallampati: III  TM Distance: >3 FB Neck ROM: Full    Dental  (+) Poor Dentition, Missing, Edentulous Upper   Pulmonary neg sleep apnea, COPD, Current Smoker and Patient abstained from smoking.   Pulmonary exam normal breath sounds clear to auscultation       Cardiovascular Exercise Tolerance: Poor METShypertension, + CAD (Multivessel CAD Previous Lexiscan w/o ischemia) and + Peripheral Vascular Disease  (-) Past MI (-) dysrhythmias  Rhythm:Regular Rate:Normal - Systolic murmurs TTE 2024:  1. Left ventricular ejection fraction, by estimation, is 50 to 55%. The  left ventricle has low normal function. The left ventricle has no regional  wall motion abnormalities. Left ventricular diastolic parameters are  consistent with Grade I diastolic  dysfunction (impaired relaxation). The average left ventricular global  longitudinal strain is -8.9 %.   2. Right ventricular systolic function is normal. The right ventricular  size is normal.   3. The mitral valve is normal in structure. Mild mitral valve  regurgitation. No evidence of mitral stenosis.   4. The aortic valve is tricuspid. Aortic valve regurgitation is not  visualized. Aortic valve sclerosis is present, with no evidence of aortic  valve stenosis.   5. There is borderline dilatation of the aortic root, measuring 37 mm.   6. The inferior vena cava is normal in size with greater than 50%  respiratory variability,  suggesting right atrial pressure of 3 mmHg.   Per cardiology, patient is optimized at acceptable risk for anesthesia   Neuro/Psych Seizures: complex partial seizure disorder.  Residual right side hemiplegia  CVA, Residual Symptoms  negative psych ROS   GI/Hepatic ,neg GERD  ,,(+)     (-) substance abuse    Endo/Other  neg diabetesHypothyroidism    Renal/GU negative Renal ROS     Musculoskeletal  (+) Arthritis ,    Abdominal   Peds  Hematology  (+) Blood dyscrasia, anemia   Anesthesia Other Findings Past Medical History: No date: Alcohol use disorder, mild, abuse No date: COPD (chronic obstructive pulmonary disease) (HCC) No date: Gout No date: Hyperlipidemia No date: Hypertension No date: Hypothyroid No date: Peripheral vascular complication No date: Stroke [facility])  Reproductive/Obstetrics                              Anesthesia Physical Anesthesia Plan  ASA: 3  Anesthesia Plan: General   Post-op Pain Management: Minimal or no pain anticipated   Induction: Intravenous  PONV Risk Score and Plan: 1 and Propofol infusion, TIVA and Ondansetron  Airway Management Planned: Nasal Cannula and Natural Airway  Additional Equipment: None  Intra-op Plan:   Post-operative Plan:   Informed Consent: I have reviewed the patients History and Physical, chart, labs and discussed the procedure including the risks, benefits and alternatives for the proposed anesthesia with the patient or authorized representative who has indicated his/her understanding and acceptance.     Dental advisory given and Consent reviewed with POA  Plan Discussed with: CRNA and Surgeon  Anesthesia Plan  Comments:         Anesthesia Quick Evaluation

## 2023-02-20 NOTE — H&P (Signed)
[facility] Admission History & Physical  MRN : [id number]  Corey Olson is a 67 y.o. (04-Aug-1955) male who presents with chief complaint of No chief complaint on file. Marland Kitchen  History of Present Illness: Patient presents for angiogram of the left lower extremity with potential revascularization.  Has had extensive vascular surgery with aortoiliac intervention previously, bilateral femoral endarterectomies a few months ago, and then a right leg intervention just a few weeks ago.  He is in a facility.  He has not had healing of his ulcerations on the lower extremities and returns for angiogram of the left lower extremity to try to improve perfusion if possible.  Current Facility-Administered Medications  Medication Dose Route Frequency Provider Last Rate Last Admin   0.9 %  sodium chloride infusion   Intravenous Continuous Georgiana Spinner, NP 75 mL/hr at 02/20/23 1025 New Bag at 02/20/23 1025   ceFAZolin (ANCEF) IVPB 2g/100 mL premix  2 g Intravenous 30 min Pre-Op Georgiana Spinner, NP       diphenhydrAMINE (BENADRYL) injection 50 mg  50 mg Intravenous Once PRN Georgiana Spinner, NP       famotidine (PEPCID) tablet 40 mg  40 mg Oral Once PRN Georgiana Spinner, NP       HYDROmorphone (DILAUDID) injection 1 mg  1 mg Intravenous Once PRN Georgiana Spinner, NP       methylPREDNISolone sodium succinate (SOLU-MEDROL) 125 mg/2 mL injection 125 mg  125 mg Intravenous Once PRN Georgiana Spinner, NP       midazolam (VERSED) 2 MG/ML syrup 8 mg  8 mg Oral Once PRN Georgiana Spinner, NP       ondansetron [facility]) injection 4 mg  4 mg Intravenous Q6H PRN Georgiana Spinner, NP        Past Medical History:  Diagnosis Date   Alcohol use disorder, mild, abuse    COPD (chronic obstructive pulmonary disease) (HCC)    Gout    Hyperlipidemia    Hypertension    Hypothyroid    Peripheral vascular complication    Stroke [facility])     Past Surgical History:  Procedure Laterality Date   ENDARTERECTOMY  FEMORAL Bilateral 11/04/2022   Procedure: ENDARTERECTOMY FEMORAL;  Surgeon: Annice Needy, MD;  Location: [facility] ORS;  Service: Vascular;  Laterality: Bilateral;   HEMATOMA EVACUATION Left 11/06/2022   Procedure: EVACUATION HEMATOMA;  Surgeon: Leonie Douglas, MD;  Location: [facility] ORS;  Service: Vascular;  Laterality: Left;   INSERTION OF ILIAC STENT Bilateral 11/04/2022   Procedure: INSERTION OF ILIAC STENT;  Surgeon: Annice Needy, MD;  Location: [facility] ORS;  Service: Vascular;  Laterality: Bilateral;   LOWER EXTREMITY ANGIOGRAPHY Right 08/25/2020   Procedure: LOWER EXTREMITY ANGIOGRAPHY;  Surgeon: Renford Dills, MD;  Location: [facility] INVASIVE CV LAB;  Service: Cardiovascular;  Laterality: Right;   LOWER EXTREMITY ANGIOGRAPHY Left 06/15/2021   Procedure: LOWER EXTREMITY ANGIOGRAPHY;  Surgeon: Renford Dills, MD;  Location: [facility] INVASIVE CV LAB;  Service: Cardiovascular;  Laterality: Left;   LOWER EXTREMITY ANGIOGRAPHY Left 11/02/2022   Procedure: Lower Extremity Angiography;  Surgeon: Annice Needy, MD;  Location: [facility] INVASIVE CV LAB;  Service: Cardiovascular;  Laterality: Left;   LOWER EXTREMITY ANGIOGRAPHY Right 01/18/2023   Procedure: Lower Extremity Angiography;  Surgeon: Annice Needy, MD;  Location: [facility] INVASIVE CV LAB;  Service: Cardiovascular;  Laterality: Right;     Social History   Tobacco Use   Smoking status: Every Day  Current packs/day: 1.00    Average packs/day: 1 pack/day for 50.0 years (50.0 ttl pk-yrs)    Types: Cigarettes   Smokeless tobacco: Never   Tobacco comments:    Attempting to quit startin 01/16/23  Vaping Use   Vaping status: Never Used  Substance Use Topics   Alcohol use: Yes    Alcohol/week: 5.0 standard drinks of alcohol    Types: 5 Standard drinks or equivalent per week    Comment: social   Drug use: Never     Family History  Problem Relation Age of Onset   Heart attack Mother    Heart Problems Father    Heart attack Father     No Known  Allergies   REVIEW OF SYSTEMS (Negative unless checked)  Constitutional: [] Weight loss  [] Fever  [] Chills Cardiac: [] Chest pain   [] Chest pressure   [] Palpitations   [] Shortness of breath when laying flat   [] Shortness of breath at rest   [] Shortness of breath with exertion. Vascular:  [] Pain in legs with walking   [] Pain in legs at rest   [] Pain in legs when laying flat   [] Claudication   [] Pain in feet when walking  [] Pain in feet at rest  [] Pain in feet when laying flat   [] History of DVT   [] Phlebitis   [] Swelling in legs   [] Varicose veins   [x] Non-healing ulcers Pulmonary:   [] Uses home oxygen   [] Productive cough   [] Hemoptysis   [] Wheeze  [x] COPD   [] Asthma Neurologic:  [] Dizziness  [] Blackouts   [] Seizures   [x] History of stroke   [] History of TIA  [] Aphasia   [] Temporary blindness   [] Dysphagia   [] Weakness or numbness in arms   [] Weakness or numbness in legs Musculoskeletal:  [x] Arthritis   [] Joint swelling   [x] Joint pain   [] Low back pain Hematologic:  [] Easy bruising  [] Easy bleeding   [] Hypercoagulable state   [] Anemic  [] Hepatitis Gastrointestinal:  [] Blood in stool   [] Vomiting blood  [x] Gastroesophageal reflux/heartburn   [] Difficulty swallowing. Genitourinary:  [] Chronic kidney disease   [] Difficult urination  [] Frequent urination  [] Burning with urination   [] Blood in urine Skin:  [] Rashes   [x] Ulcers   [x] Wounds Psychological:  [] History of anxiety   []  History of major depression.  Physical Examination  Vitals:   02/20/23 1030  BP: 95/74  Pulse: (!) 57  Resp: (!) 24  SpO2: 100%   There is no height or weight on file to calculate BMI. Gen: WD/WN, NAD Head: [place]/AT, No temporalis wasting.  Ear/Nose/Throat: Hearing grossly intact, nares w/o erythema or drainage, oropharynx w/o Erythema/Exudate,  Eyes: Conjunctiva clear, sclera non-icteric Neck: Trachea midline.  No JVD.  Pulmonary:  Good air movement, respirations not labored, no use of accessory muscles.  Cardiac:  Bradycardic Vascular:  Vessel Right Left  Radial Palpable Palpable                          PT Not Palpable Not Palpable  DP 1+ Palpable Not Palpable   Gastrointestinal: soft, non-tender/non-distended. No guarding/reflex.  Musculoskeletal: M/S 5/5 throughout.  Extremities without ischemic changes.  Mild swelling present.  Ulceration on BLE, dressed. Neurologic: Sensation grossly intact in extremities.  Symmetrical.  Speech is fluent. Motor exam as listed above. Psychiatric: Judgment and insight are fair at best Dermatologic: BLE ulcerations dressed.     CBC Lab Results  Component Value Date   WBC 9.9 11/19/2022   HGB 10.2 (L) 11/19/2022  HCT 32.2 (L) 11/19/2022   MCV 89.2 11/19/2022   PLT 520 (H) 11/19/2022    BMET    Component Value Date/Time   NA 138 11/19/2022 0833   K 4.7 11/19/2022 0833   CL 106 11/19/2022 0833   CO2 24 11/19/2022 0833   GLUCOSE 106 (H) 11/19/2022 0833   BUN 12 01/18/2023 0729   CREATININE 1.07 01/18/2023 0729   CALCIUM 8.8 (L) 11/19/2022 0833   GFRNONAA >60 01/18/2023 0729   CrCl cannot be calculated (Patient's most recent lab result is older than the maximum 21 days allowed.).  COAG Lab Results  Component Value Date   INR 1.1 11/08/2022   INR 1.1 11/04/2022   INR 1.0 11/01/2022    Radiology No results found.   Assessment/Plan 1.  PAD with ulceration bilateral lower extremity.  Has had extensive revascularization of both lower extremities in the past.  For left lower extremity angiogram today.  Risks and benefits discussed 2.  Hypertension. blood pressure control important in reducing the progression of atherosclerotic disease. On appropriate oral medications. 3. Hyperlipidemia. lipid control important in reducing the progression of atherosclerotic disease. Continue statin therapy    [name], MD  02/20/2023 10:40 AM

## 2023-02-20 NOTE — Anesthesia Postprocedure Evaluation (Signed)
Anesthesia Post Note  Patient: Corey Olson  Procedure(s) Performed: Lower Extremity Angiography (Left: Leg Lower)  Patient location during evaluation: PACU Anesthesia Type: General Level of consciousness: awake and alert Pain management: pain level controlled Vital Signs Assessment: post-procedure vital signs reviewed and stable Respiratory status: spontaneous breathing, nonlabored ventilation and respiratory function stable Cardiovascular status: blood pressure returned to baseline and stable Postop Assessment: no apparent nausea or vomiting Anesthetic complications: no   There were no known notable events for this encounter.   Last Vitals:  Vitals:   02/20/23 1335 02/20/23 1345  BP:  124/63  Pulse: 94 86  Resp: 18 16  Temp:    SpO2: 93% 96%    Last Pain:  Vitals:   02/20/23 1330  PainSc: 0-No pain                 Foye Deer

## 2023-02-20 NOTE — Anesthesia Procedure Notes (Signed)
Procedure Name: General with mask airway Date/Time: 02/20/2023 11:52 AM  Performed by: Mohammed Kindle, CRNAPre-anesthesia Checklist: Patient identified, Emergency Drugs available, Suction available and Patient being monitored Patient Re-evaluated:Patient Re-evaluated prior to induction Oxygen Delivery Method: Simple face mask Induction Type: IV induction Placement Confirmation: positive ETCO2, CO2 detector and breath sounds checked- equal and bilateral Dental Injury: Teeth and Oropharynx as per pre-operative assessment

## 2023-02-20 NOTE — Op Note (Signed)
[facility]  Percutaneous Study/Intervention Procedural Note   Date of Surgery: 02/20/2023  Surgeon(s):Jannely Henthorn    Assistants:none  Pre-operative Diagnosis: PAD with ulceration BLE  Post-operative diagnosis:  Same  Procedure(s) Performed:             1.  Ultrasound guidance for vascular access right femoral artery             2.  Catheter placement into left common femoral artery from right femoral approach             3.  Aortogram and selective left lower extremity angiogram             4.  Percutaneous transluminal angioplasty of left peroneal artery and tibioperoneal trunk with 2.5 mm diameter angioplasty balloon and then angioplasty of the tibioperoneal trunk and most distal popliteal artery with a 4 mm diameter Lutonix drug-coated angioplasty balloon             5.  Percutaneous transluminal angioplasty of the entire left SFA and popliteal arteries with 5 mm diameter Lutonix drug-coated angioplasty balloons  6.  Stent placement to the mid to distal left SFA and popliteal artery with 6 mm x 25 cm Viabahn stent  7.  Stent to the proximal left SFA with 8 mm x 7.5 cm length Viabahn stent             8.  StarClose closure device right femoral artery  EBL: 5 cc  Contrast: 40 cc  Fluoro Time: 14.3 minutes  Anesthesia: MAC              Indications:  Patient is a 67 y.o.male with nonhealing ulcerations of both lower extremities and extensive previous revascularization with femoral endarterectomies and iliac stents bilaterally.  He underwent right lower extremity revascularization a couple of weeks ago. The patient is brought in for angiography for further evaluation and potential treatment.  Due to the limb threatening nature of the situation, angiogram was performed for attempted limb salvage. The patient is aware that if the procedure fails, amputation would be expected.  The patient also understands that even with successful revascularization, amputation may  still be required due to the severity of the situation.  Risks and benefits are discussed and informed consent is obtained.   Procedure:  The patient was identified and appropriate procedural time out was performed.  The patient was then placed supine on the table and prepped and draped in the usual sterile fashion. Moderate conscious sedation was administered during a face to face encounter with the patient throughout the procedure with my supervision of the RN administering medicines and monitoring the patient's vital signs, pulse oximetry, telemetry and mental status throughout from the start of the procedure until the patient was taken to the recovery room. Ultrasound was used to evaluate the right common femoral artery.  It was patent .  A digital ultrasound image was acquired.  A Seldinger needle was used to access the right common femoral artery under direct ultrasound guidance and a permanent image was performed.  A 0.035 J wire was advanced without resistance and a 5Fr sheath was placed.  I then crossed the aortic bifurcation and advanced to the left femoral head. Selective left lower extremity angiogram was then performed. This demonstrated a short stump of the left SFA most proximally that was patent from her previous endarterectomy with occlusion about 2 cm beyond the origin.  There was then occlusion down through the entirety of the SFA  and the popliteal arteries.  The posterior tibial and peroneal arteries were occluded in the proximal segment with reconstitution of the proximal to mid peroneal artery in the mid to distal posterior tibial artery.  The anterior tibial artery was not visualized to reconstitute distally. It was felt that it was in the patient's best interest to proceed with intervention after these images to avoid a second procedure and a larger amount of contrast and fluoroscopy based off of the findings from the initial angiogram. The patient was systemically heparinized and a 6  [place] Ansell sheath was then placed over the [employer] wire.  I then upsized to a 7 [place] sheath later in the procedure for intervention.  I then used a Kumpe catheter and the advantage wire to cross the occlusion in the left SFA, popliteal artery, tibioperoneal trunk, and peroneal artery confirming intraluminal flow in the mid peroneal artery and exchanging for a V18 wire.  A 2.5 mm diameter by 30 cm length angioplasty balloon was then inflated from the mid peroneal artery up through the tibioperoneal trunk and inflated to 8 atm for 1 minute.  This was used to predilate the SFA and popliteal lesions as well.  The SFA and popliteal lesions were addressed with a 5 mm diameter by 30 cm length Lutonix drug-coated angioplasty balloon and a 5 mm diameter by 22 cm length Lutonix drug-coated angioplasty balloon.  Completion imaging showed a near occlusive residual stenosis at the proximal SFA at the previous occlusion point.  There was then about 15 to 20 cm of SFA that was widely patent but the mid and distal SFA and above-knee popliteal artery had greater than 50% residual stenosis.  The tibioperoneal trunk was still greater than 50% stenotic as well.  I deployed a 6 mm diameter by 25 cm length Viabahn stent from the mid popliteal artery up to the mid to distal SFA.  This was postdilated with a 5 and a 6 mm diameter angioplasty balloon.  The tibioperoneal trunk was addressed with a 4 mm diameter by 10 cm length Lutonix drug-coated angioplasty balloon inflated to 8 atm for 1 minute.  Completion imaging following this intervention showed about a 20% residual stenosis in the tibioperoneal trunk and about a 10% residual stenosis in the popliteal artery after stent placement.  The SFA was widely patent but the areas that had been stented.  The proximal SFA required a larger stent and that is why we upsized to a 7 [place] sheath.  An 8 mm diameter by 7.5 cm length Viabahn stent was deployed in the proximal left SFA.   This was postdilated with a 7 and then an 8 mm diameter high-pressure angioplasty balloon with about a 15% residual stenosis after stent placement. I elected to terminate the procedure. The sheath was removed and StarClose closure device was deployed in the right femoral artery with excellent hemostatic result. The patient was taken to the recovery room in stable condition having tolerated the procedure well.  Findings:                            Left Lower Extremity:  This demonstrated a short stump of the left SFA most proximally that was patent from her previous endarterectomy with occlusion about 2 cm beyond the origin.  There was then occlusion down through the entirety of the SFA and the popliteal arteries.  The posterior tibial and peroneal arteries were occluded in the proximal segment with  reconstitution of the proximal to mid peroneal artery in the mid to distal posterior tibial artery.  The anterior tibial artery was not visualized to reconstitute distally.   Disposition: Patient was taken to the recovery room in stable condition having tolerated the procedure well.  Complications: None  [name] 02/20/2023 1:04 PM   This note was created with Dragon Medical transcription system. Any errors in dictation are purely unintentional.

## 2023-02-20 NOTE — Transfer of Care (Signed)
Immediate Anesthesia Transfer of Care Note  Patient: Corey Olson  Procedure(s) Performed: Lower Extremity Angiography (Left: Leg Lower)  Patient Location: PACU  Anesthesia Type:General  Level of Consciousness: drowsy and patient cooperative  Airway & Oxygen Therapy: Patient Spontanous Breathing and Patient connected to face mask oxygen  Post-op Assessment: Report given to RN and Post -op Vital signs reviewed and stable  Post vital signs: Reviewed and stable  Last Vitals:  Vitals Value Taken Time  BP 103/58 02/20/23 1300  Temp    Pulse 85 02/20/23 1303  Resp 21 02/20/23 1303  SpO2 100 % 02/20/23 1303  Vitals shown include unfiled device data.  Last Pain:  Vitals:   02/20/23 1256  PainSc: (P) Asleep         Complications: There were no known notable events for this encounter.

## 2023-02-21 ENCOUNTER — Encounter: Payer: Self-pay | Admitting: Vascular Surgery

## 2023-03-24 ENCOUNTER — Encounter (INDEPENDENT_AMBULATORY_CARE_PROVIDER_SITE_OTHER): Payer: Medicare Other

## 2023-03-24 ENCOUNTER — Ambulatory Visit (INDEPENDENT_AMBULATORY_CARE_PROVIDER_SITE_OTHER): Payer: Medicare Other | Admitting: Nurse Practitioner

## 2023-04-10 ENCOUNTER — Encounter: Payer: Self-pay | Admitting: *Deleted

## 2023-04-28 ENCOUNTER — Encounter (INDEPENDENT_AMBULATORY_CARE_PROVIDER_SITE_OTHER): Payer: Medicare Other

## 2023-04-28 ENCOUNTER — Ambulatory Visit (INDEPENDENT_AMBULATORY_CARE_PROVIDER_SITE_OTHER): Payer: Medicare Other | Admitting: Nurse Practitioner

## 2023-05-26 ENCOUNTER — Encounter (INDEPENDENT_AMBULATORY_CARE_PROVIDER_SITE_OTHER): Payer: Medicare Other

## 2023-05-26 ENCOUNTER — Encounter (INDEPENDENT_AMBULATORY_CARE_PROVIDER_SITE_OTHER): Payer: Self-pay

## 2023-05-26 ENCOUNTER — Ambulatory Visit (INDEPENDENT_AMBULATORY_CARE_PROVIDER_SITE_OTHER): Payer: Medicare Other | Admitting: Nurse Practitioner

## 2023-08-21 ENCOUNTER — Encounter (INDEPENDENT_AMBULATORY_CARE_PROVIDER_SITE_OTHER): Payer: Self-pay

## 2023-09-14 ENCOUNTER — Encounter: Payer: Self-pay | Admitting: Emergency Medicine

## 2023-09-14 ENCOUNTER — Emergency Department

## 2023-09-14 ENCOUNTER — Emergency Department
Admission: EM | Admit: 2023-09-14 | Discharge: 2023-09-14 | Disposition: A | Discharge status: Home / Self Care | Attending: Emergency Medicine | Admitting: Emergency Medicine

## 2023-09-14 ENCOUNTER — Other Ambulatory Visit: Payer: Self-pay

## 2023-09-14 DIAGNOSIS — I10 Essential (primary) hypertension: Secondary | ICD-10-CM | POA: Insufficient documentation

## 2023-09-14 DIAGNOSIS — R4701 Aphasia: Secondary | ICD-10-CM | POA: Diagnosis present

## 2023-09-14 DIAGNOSIS — E039 Hypothyroidism, unspecified: Secondary | ICD-10-CM | POA: Insufficient documentation

## 2023-09-14 DIAGNOSIS — I69320 Aphasia following cerebral infarction: Secondary | ICD-10-CM | POA: Insufficient documentation

## 2023-09-14 DIAGNOSIS — W06XXXA Fall from bed, initial encounter: Secondary | ICD-10-CM | POA: Insufficient documentation

## 2023-09-14 DIAGNOSIS — J449 Chronic obstructive pulmonary disease, unspecified: Secondary | ICD-10-CM | POA: Diagnosis not present

## 2023-09-14 DIAGNOSIS — W19XXXA Unspecified fall, initial encounter: Secondary | ICD-10-CM

## 2023-09-14 NOTE — ED Provider Notes (Signed)
-----------------------------------------   5:54 PM on 09/14/2023 ----------------------------------------- Patient care assumed from Dr. Vicenta Graft.  CT scan of the head and C-spine have resulted showing no acute abnormality.  However there is a right apical nodule that has increased since 11/05/2022.  I discussed this with the patient and family member.  Recommended that they follow-up with her PCP for repeat CT imaging and possible consideration of biopsy.  Patient and family member agreeable to plan.  We will also refer to oncology for possible biopsy.   Ruth Cove, MD 09/14/23 1755

## 2023-09-14 NOTE — ED Notes (Signed)
 First Nurse Note: Pt to ED via [facility] from [facility] care for a fall this morning. Pt fell out of the bed. Pt does not have complaints. Pt sent out because he on Eliquis . Pt is A & [facility] at his baseline.

## 2023-09-14 NOTE — Discharge Instructions (Addendum)
 Your CT scans do not show any significant abnormality of the head or neck, however they did show a large nodule in the right upper lung.  Please follow-up with your primary care doctor within the next week or so for recheck/reevaluation and consideration of a CT scan of the chest to further evaluate.  Please call the number provided for oncology/hematology to arrange an appointment as they may wish to proceed with a biopsy of this nodule.  Return to the emergency department for any symptom personally concerning to yourself.

## 2023-09-14 NOTE — ED Provider Notes (Signed)
 [facility] Provider Note    Event Date/Time   First MD Initiated Contact with Patient 09/14/23 1358     (approximate)   History   Chief Complaint: Fall   HPI  Corey Olson is a 68 y.o. male with a history of COPD hypertension stroke with right hemiparesis and aphasia who was sent to the ED from his skilled nursing facility due to rolling out of bed and falling to the floor this morning at 3:00 AM.  Patient denies pain or other acute complaints.  History is limited by aphasia        Past Medical History:  Diagnosis Date   Alcohol use disorder, mild, abuse    COPD (chronic obstructive pulmonary disease) (HCC)    Gout    Hyperlipidemia    Hypertension    Hypothyroid    Peripheral vascular complication    Stroke [facility])     Current Outpatient Rx   Order #: [id number] Class: Historical Med   Order #: [id number] Class: Historical Med   Order #: [id number] Class: Historical Med   Order #: [id number] Class: No Print   Order #: [id number] Class: Historical Med   Order #: [id number] Class: Historical Med   Order #: [id number] Class: Historical Med   Order #: [id number] Class: Historical Med   Order #: [id number] Class: Historical Med   Order #: [id number] Class: Historical Med   Order #: [id number] Class: No Print   Order #: [id number] Class: No Print   Order #: [id number] Class: No Print    Past Surgical History:  Procedure Laterality Date   ENDARTERECTOMY FEMORAL Bilateral 11/04/2022   Procedure: ENDARTERECTOMY FEMORAL;  Surgeon: Celso College, MD;  Location: [facility] ORS;  Service: Vascular;  Laterality: Bilateral;   HEMATOMA EVACUATION Left 11/06/2022   Procedure: EVACUATION HEMATOMA;  Surgeon: Carlene Che, MD;  Location: [facility] OR[facility];  Service: Vascular;  Laterality: Left;   INSERTION OF ILIAC STENT Bilateral 11/04/2022   Procedure: INSERTION OF ILIAC STENT;  Surgeon: Celso College, MD;  Location: [facility] ORS;  Service: Vascular;  Laterality: Bilateral;   LOWER EXTREMITY  ANGIOGRAPHY Right 08/25/2020   Procedure: LOWER EXTREMITY ANGIOGRAPHY;  Surgeon: Jackquelyn Mass, MD;  Location: [facility];  Service: Cardiovascular;  Laterality: Right;   LOWER EXTREMITY ANGIOGRAPHY Left 06/15/2021   Procedure: LOWER EXTREMITY ANGIOGRAPHY;  Surgeon: Jackquelyn Mass, MD;  Location: [facility];  Service: Cardiovascular;  Laterality: Left;   LOWER EXTREMITY ANGIOGRAPHY Left 11/02/2022   Procedure: Lower Extremity Angiography;  Surgeon: Celso College, MD;  Location: [facility];  Service: Cardiovascular;  Laterality: Left;   LOWER EXTREMITY ANGIOGRAPHY Right 01/18/2023   Procedure: Lower Extremity Angiography;  Surgeon: Celso College, MD;  Location: [facility];  Service: Cardiovascular;  Laterality: Right;   LOWER EXTREMITY ANGIOGRAPHY Left 02/20/2023   Procedure: Lower Extremity Angiography;  Surgeon: Celso College, MD;  Location: [facility];  Service: Cardiovascular;  Laterality: Left;    Physical Exam   Triage Vital Signs: ED Triage Vitals [09/14/23 1306]  Encounter Vitals Group     BP 92/67     Systolic BP Percentile      Diastolic BP Percentile      Pulse Rate 95     Resp 16     Temp 98.2 F (36.8 C)     Temp Source Oral     SpO2 98 %     Weight 130 lb 1.1 oz (59 kg)     Height 5\' 7"  (  1.702 m)     Head Circumference      Peak Flow      Pain Score 0     Pain Loc      Pain Education      Exclude from Growth Chart     Most recent vital signs: Vitals:   09/14/23 1306  BP: 92/67  Pulse: 95  Resp: 16  Temp: 98.2 F (36.8 C)  SpO2: 98%    General: Awake, no distress.  CV:  Good peripheral perfusion.  Regular rate rhythm Resp:  Normal effort.  Clear to auscultation bilaterally Abd:  No distention.  Soft nontender Other:  Chronic contracture right upper extremity, prior right AKA.  Left side with full range of motion, long bones stable, chest wall pelvis and clavicles are stable.  No deformities or bony point  tenderness.  No midline spinal tenderness.  Head is atraumatic.   ED Results / Procedures / Treatments   Labs (all labs ordered are listed, but only abnormal results are displayed) Labs Reviewed - No data to display   EKG    RADIOLOGY CT head and cervical spine pending   PROCEDURES:  Procedures   MEDICATIONS ORDERED IN ED: Medications - No data to display   IMPRESSION / MDM / ASSESSMENT AND PLAN / ED COURSE  I reviewed the triage vital signs and the nursing notes.  DDx: Intracranial hemorrhage, C-spine fracture, mechanical fall.  Doubt pneumothorax or hip fracture.  Patient's presentation is most consistent with acute presentation with potential threat to life or bodily function.  Patient sent to the ED due to unwitnessed fall.  History and exam are limited by patient's chronic aphasia and hemiparesis from prior stroke.  CT head and cervical spine obtained.  Screening trauma workup.  No labs indicated at this time.  Appears to be at baseline mental status.  Anticipate he will be stable for discharge back to SNF if there are no severe findings on imaging.       FINAL CLINICAL IMPRESSION(S) / ED DIAGNOSES   Final diagnoses:  Aphasia as late effect of stroke  Fall, initial encounter     Rx / DC Orders   ED Discharge Orders     None        Note:  This document was prepared using Dragon voice recognition software and may include unintentional dictation errors.   Jacquie Maudlin, MD 09/14/23 1414

## 2023-09-14 NOTE — ED Notes (Signed)
 [facility] called for transport to [facility].

## 2023-09-14 NOTE — ED Triage Notes (Signed)
 Pt to ED via [facility] from [facility] for a fall this morning at 0300. Pt rolled out of the bed. Pt is not c/o any injuries. Pt is on Eliquis .

## 2023-10-10 ENCOUNTER — Encounter (INDEPENDENT_AMBULATORY_CARE_PROVIDER_SITE_OTHER): Payer: Self-pay

## 2023-10-13 ENCOUNTER — Encounter (INDEPENDENT_AMBULATORY_CARE_PROVIDER_SITE_OTHER)

## 2023-10-13 ENCOUNTER — Ambulatory Visit (INDEPENDENT_AMBULATORY_CARE_PROVIDER_SITE_OTHER): Admitting: Nurse Practitioner

## 2023-10-13 ENCOUNTER — Encounter (INDEPENDENT_AMBULATORY_CARE_PROVIDER_SITE_OTHER): Payer: Self-pay

## 2023-10-27 ENCOUNTER — Encounter: Payer: Self-pay | Admitting: Oncology

## 2023-10-27 ENCOUNTER — Inpatient Hospital Stay

## 2023-10-27 ENCOUNTER — Inpatient Hospital Stay: Attending: Oncology | Admitting: Oncology

## 2023-10-27 VITALS — BP 111/67 | HR 63 | Temp 98.6°F | Resp 20

## 2023-10-27 DIAGNOSIS — D509 Iron deficiency anemia, unspecified: Secondary | ICD-10-CM

## 2023-10-27 DIAGNOSIS — D649 Anemia, unspecified: Secondary | ICD-10-CM | POA: Insufficient documentation

## 2023-10-27 DIAGNOSIS — Z89611 Acquired absence of right leg above knee: Secondary | ICD-10-CM | POA: Diagnosis not present

## 2023-10-27 DIAGNOSIS — R911 Solitary pulmonary nodule: Secondary | ICD-10-CM | POA: Insufficient documentation

## 2023-10-27 DIAGNOSIS — Z87891 Personal history of nicotine dependence: Secondary | ICD-10-CM | POA: Insufficient documentation

## 2023-10-27 DIAGNOSIS — I69320 Aphasia following cerebral infarction: Secondary | ICD-10-CM | POA: Insufficient documentation

## 2023-10-27 DIAGNOSIS — I69351 Hemiplegia and hemiparesis following cerebral infarction affecting right dominant side: Secondary | ICD-10-CM | POA: Diagnosis not present

## 2023-10-27 LAB — CBC WITH DIFFERENTIAL/PLATELET
Abs Immature Granulocytes: 0.05 10*3/uL (ref 0.00–0.07)
Basophils Absolute: 0 10*3/uL (ref 0.0–0.1)
Basophils Relative: 1 %
Eosinophils Absolute: 0.1 10*3/uL (ref 0.0–0.5)
Eosinophils Relative: 1 %
HCT: 35.8 % — ABNORMAL LOW (ref 39.0–52.0)
Hemoglobin: 10.4 g/dL — ABNORMAL LOW (ref 13.0–17.0)
Immature Granulocytes: 1 %
Lymphocytes Relative: 21 %
Lymphs Abs: 1.2 10*3/uL (ref 0.7–4.0)
MCH: 24.2 pg — ABNORMAL LOW (ref 26.0–34.0)
MCHC: 29.1 g/dL — ABNORMAL LOW (ref 30.0–36.0)
MCV: 83.3 fL (ref 80.0–100.0)
Monocytes Absolute: 0.4 10*3/uL (ref 0.1–1.0)
Monocytes Relative: 7 %
Neutro Abs: 4 10*3/uL (ref 1.7–7.7)
Neutrophils Relative %: 69 %
Platelets: 368 10*3/uL (ref 150–400)
RBC: 4.3 MIL/uL (ref 4.22–5.81)
RDW: 18.8 % — ABNORMAL HIGH (ref 11.5–15.5)
WBC: 5.8 10*3/uL (ref 4.0–10.5)
nRBC: 0 % (ref 0.0–0.2)

## 2023-10-27 LAB — TECHNOLOGIST SMEAR REVIEW: Plt Morphology: ADEQUATE

## 2023-10-27 LAB — COMPREHENSIVE METABOLIC PANEL WITH GFR
ALT: 15 U/L (ref 0–44)
AST: 15 U/L (ref 15–41)
Albumin: 3.3 g/dL — ABNORMAL LOW (ref 3.5–5.0)
Alkaline Phosphatase: 87 U/L (ref 38–126)
Anion gap: 9 (ref 5–15)
BUN: 12 mg/dL (ref 8–23)
CO2: 26 mmol/L (ref 22–32)
Calcium: 8.7 mg/dL — ABNORMAL LOW (ref 8.9–10.3)
Chloride: 105 mmol/L (ref 98–111)
Creatinine, Ser: 0.77 mg/dL (ref 0.61–1.24)
GFR, Estimated: >60 mL/min (ref >60)
Glucose, Bld: 73 mg/dL (ref 70–99)
Potassium: 3.4 mmol/L — ABNORMAL LOW (ref 3.5–5.1)
Sodium: 140 mmol/L (ref 135–145)
Total Bilirubin: 0.4 mg/dL (ref 0.0–1.2)
Total Protein: 7.7 g/dL (ref 6.5–8.1)

## 2023-10-27 LAB — FOLATE: Folate: >40 ng/mL (ref >5.9)

## 2023-10-27 LAB — RETIC PANEL
Immature Retic Fract: 9.1 % (ref 2.3–15.9)
RBC.: 4.12 MIL/uL — ABNORMAL LOW (ref 4.22–5.81)
Retic Count, Absolute: 41.2 10*3/uL (ref 19.0–186.0)
Retic Ct Pct: 1 % (ref 0.4–3.1)
Reticulocyte Hemoglobin: 26.5 pg — ABNORMAL LOW (ref >27.9)

## 2023-10-27 LAB — IRON AND TIBC
Iron: 35 ug/dL — ABNORMAL LOW (ref 45–182)
Saturation Ratios: 12 % — ABNORMAL LOW (ref 17.9–39.5)
TIBC: 298 ug/dL (ref 250–450)
UIBC: 263 ug/dL

## 2023-10-27 LAB — FERRITIN: Ferritin: 474 ng/mL — ABNORMAL HIGH (ref 24–336)

## 2023-10-27 LAB — LACTATE DEHYDROGENASE: LDH: 123 U/L (ref 98–192)

## 2023-10-27 LAB — VITAMIN B12: Vitamin B-12: 369 pg/mL (ref 180–914)

## 2023-10-28 DIAGNOSIS — R911 Solitary pulmonary nodule: Secondary | ICD-10-CM | POA: Insufficient documentation

## 2023-10-28 DIAGNOSIS — D649 Anemia, unspecified: Secondary | ICD-10-CM | POA: Insufficient documentation

## 2023-10-28 NOTE — Assessment & Plan Note (Signed)
 Imaging results were reviewed and discussed with patient. Growing right apical nodule.  Patient is at high risk of developing primary lung neoplasm due to extensive smoking history. I recommend to obtain dedicated CT chest with contrast for further evaluation. Discussed about possible additional imaging including PET scan, biopsy if needed.  Daughter agrees with the plan.

## 2023-10-28 NOTE — Progress Notes (Signed)
 Hematology/Oncology Consult Note [phone] [phone]     REFERRING PROVIDER: Ozell Blunt, MD    CHIEF COMPLAINTS/PURPOSE OF CONSULTATION:  Lung nodule  ASSESSMENT & PLAN:   Lung nodule Imaging results were reviewed and discussed with patient. Growing right apical nodule.  Patient is at high risk of developing primary lung neoplasm due to extensive smoking history. I recommend to obtain dedicated CT chest with contrast for further evaluation. Discussed about possible additional imaging including PET scan, biopsy if needed.  Daughter agrees with the plan.  Normocytic anemia Check vitamin B12, folate, reticulocyte panel, CBC, CMP, protein electrophoresis, LDH, smear.   Orders Placed This Encounter  Procedures   CT CHEST W CONTRAST    Standing Status:   Future    Expected Date:   11/03/2023    Expiration Date:   10/26/2024    If indicated for the ordered procedure, I authorize the administration of contrast media per Radiology protocol:   Yes    Preferred imaging location?:   [facility]    Radiology Contrast Protocol - do NOT remove file path:   [email or web address]   Ferritin    Standing Status:   Future    Number of Occurrences:   1    Expected Date:   10/27/2023    Expiration Date:   04/27/2024   Iron and TIBC    Standing Status:   Future    Number of Occurrences:   1    Expected Date:   10/27/2023    Expiration Date:   10/26/2024   Vitamin B12    Standing Status:   Future    Number of Occurrences:   1    Expected Date:   10/27/2023    Expiration Date:   10/26/2024   Folate    Standing Status:   Future    Number of Occurrences:   1    Expected Date:   10/27/2023    Expiration Date:   10/26/2024   CBC with Differential/Platelet    Standing Status:   Future    Number of Occurrences:   1    Expected Date:   10/27/2023    Expiration Date:   10/26/2024   Retic Panel    Standing Status:   Future    Number of  Occurrences:   1    Expected Date:   10/27/2023    Expiration Date:   10/26/2024   Protein electrophoresis, serum    Standing Status:   Future    Number of Occurrences:   1    Expected Date:   10/27/2023    Expiration Date:   10/26/2024   Comprehensive metabolic panel with GFR    Standing Status:   Future    Number of Occurrences:   1    Expected Date:   10/27/2023    Expiration Date:   10/26/2024   Lactate dehydrogenase    Standing Status:   Future    Number of Occurrences:   1    Expected Date:   10/27/2023    Expiration Date:   10/26/2024   Technologist smear review    Standing Status:   Future    Number of Occurrences:   1    Expected Date:   10/27/2023    Expiration Date:   10/26/2024    Clinical information::   anemia    All questions were answered. The patient knows to call the clinic with any problems, questions or concerns.  Timmy Forbes, MD,  PhD [facility] Hematology Oncology 10/27/2023    HISTORY OF PRESENTING ILLNESS:  Corey Olson 68 y.o. male presents to establish care for lung nodule   09/14/2023, patient presented emergency room from skilled nursing facility due to rolling out of bed and falling to the floor. CT workup -CT cervical spine without contrast showed No acute intracranial pathology.  Right apical nodule increased since chest CT of 11/05/2022. Patient was referred to establish with oncology for further evaluation.  He was accompanied by his daughter today. Patient has history of stroke with right hemiparesis and aphasia.  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 He currently lives with his daughter Patient is unable to provide any meaningful history. History was obtained from daughter.  Patient has a history of extensive smoking history.  Started smoked when he was 9, a pack a day for about 58 years.  He quitted smoking in July 2024 Per daughter, patient has some weight loss when he lives at the facility.   Past Medical History:   Diagnosis Date   Alcohol use disorder, mild, abuse    COPD (chronic obstructive pulmonary disease) (HCC)    Gout    Hyperlipidemia    Hypertension    Hypothyroid    Peripheral vascular complication    Stroke [facility])     SURGICAL HISTORY: Past Surgical History:  Procedure Laterality Date   ENDARTERECTOMY FEMORAL Bilateral 11/04/2022   Procedure: ENDARTERECTOMY FEMORAL;  Surgeon: Celso College, MD;  Location: [facility] ORS;  Service: Vascular;  Laterality: Bilateral;   HEMATOMA EVACUATION Left 11/06/2022   Procedure: EVACUATION HEMATOMA;  Surgeon: Carlene Che, MD;  Location: [facility] ORS;  Service: Vascular;  Laterality: Left;   INSERTION OF ILIAC STENT Bilateral 11/04/2022   Procedure: INSERTION OF ILIAC STENT;  Surgeon: Celso College, MD;  Location: [facility] ORS;  Service: Vascular;  Laterality: Bilateral;   LOWER EXTREMITY ANGIOGRAPHY Right 08/25/2020   Procedure: LOWER EXTREMITY ANGIOGRAPHY;  Surgeon: Jackquelyn Mass, MD;  Location: [facility] INVASIVE CV LAB;  Service: Cardiovascular;  Laterality: Right;   LOWER EXTREMITY ANGIOGRAPHY Left 06/15/2021   Procedure: LOWER EXTREMITY ANGIOGRAPHY;  Surgeon: Jackquelyn Mass, MD;  Location: [facility] INVAS[facility] CV LAB;  Service: Cardiovascular;  Laterality: Left;   LOWER EXTREMITY ANGIOGRAPHY Left 11/02/2022   Procedure: Lower Extremity Angiography;  Surgeon: Celso College, MD;  Location: [facility] INV[facility] CV [facility];  Service: Cardiovascular;  Laterality: Left;   LOWER EXTREMITY ANGIOGRAPHY Right 01/18/2023   Procedure: Lower Extremity Angiography;  Surgeon: Celso College, MD;  Location: [facility] IN[facility];  Service: Cardiovascular;  Laterality: Right;   LOWER EXTREMITY ANGIOGRAPHY Left 02/20/2023   Procedure: Lower Extremity Angiography;  Surgeon: Celso College, MD;  Location: [facility] INV[facility] CV LAB;  Service: Cardiovascular;  Laterality: Left;    SOCIAL HISTORY: Social History   Socioeconomic History   Marital status: Single    Spouse name: Not on file   Number of  children: Not on file   Years of education: Not on file   Highest education level: Not on file  Occupational History   Not on file  Tobacco Use   Smoking status: Former    Current packs/day: 1.00    Average packs/day: 1 pack/day for 50.0 years (50.0 ttl pk-yrs)    Types: Cigarettes   Smokeless tobacco: Never   Tobacco comments:    Attempting to quit startin 01/16/23  Vaping Use   Vaping status: Never Used  Substance and Sexual Activity   Alcohol use:  Not Currently    Alcohol/week: 5.0 standard drinks of alcohol    Types: 5 Standard drinks or equivalent per week    Comment: social   Drug use: Never   Sexual activity: Not Currently  Other Topics Concern   Not on file  Social History Narrative   Not on file   Social Drivers of Health   Financial Resource Strain: Low Risk  (09/26/2023)   Received from [facility]   Overall Financial Resource Strain (CARDIA)    Difficulty of Paying Living Expenses: Not hard at all  Food Insecurity: No Food Insecurity (10/27/2023)   Hunger Vital Sign    Worried About Running Out of Food in the Last Year: Never true    Ran Out of Food in the Last Year: Never true  Transportation Needs: No Transportation Needs (10/27/2023)   PRAPARE - Administrator, Civil Service (Medical): No    Lack of Transportation (Non-Medical): No  Physical Activity: Not on file  Stress: Not on file  Social Connections: Not on file  Intimate Partner Violence: Not At Risk (10/27/2023)   Humiliation, Afraid, Rape, and Kick questionnaire    Fear of Current or Ex-Partner: No    Emotionally Abused: No    Physically Abused: No    Sexually Abused: No    FAMILY HISTORY: Family History  Problem Relation Age of Onset   Heart attack Mother    Heart Problems Father    Heart attack Father     ALLERGIES:  has no known allergies.  MEDICATIONS:  Current Outpatient Medications  Medication Sig Dispense Refill   acetaminophen  (TYLENOL ) 500 MG tablet  Take 500 mg by mouth every 6 (six) hours as needed for moderate pain or mild pain (lEG PAIN).     albuterol  (VENTOLIN  HFA) 108 (90 Base) MCG/ACT inhaler Inhale 2 puffs into the lungs every 6 (six) hours as needed for wheezing or shortness of breath.     allopurinol  (ZYLOPRIM ) 300 MG tablet Take 300 mg by mouth daily.     apixaban  (ELIQUIS ) 5 MG TABS tablet Take 1 tablet (5 mg total) by mouth 2 (two) times daily. 60 tablet    aspirin  81 MG EC tablet Take 325 mg by mouth daily.     atorvastatin  (LIPITOR ) 80 MG tablet Take 80 mg by mouth daily.     budesonide-formoterol (SYMBICORT) 160-4.5 MCG/ACT inhaler Inhale 2 puffs into the lungs daily.     ferrous sulfate 325 (65 FE) MG EC tablet Take 325 mg by mouth.     folic acid (FOLVITE) 1 MG tablet Take 1 mg by mouth daily.     melatonin 3 MG TABS tablet Take 3 mg by mouth.     metoprolol  succinate (TOPROL -XL) 50 MG 24 hr tablet Take 50 mg by mouth daily.     pantoprazole  (PROTONIX ) 40 MG tablet Take 1 tablet (40 mg total) by mouth daily.     polyethylene glycol (MIRALAX  / GLYCOLAX ) 17 g packet Take 17 g by mouth 2 (two) times daily. 14 each 0   thiamine  (VITAMIN B-1) 100 MG tablet Take 1 tablet (100 mg total) by mouth daily.     gabapentin (NEURONTIN) 100 MG capsule Take 100 mg by mouth 2 (two) times daily. (Patient not taking: Reported on 10/27/2023)     lisinopril  (ZESTRIL ) 20 MG tablet Take 20 mg by mouth daily. (Patient not taking: Reported on 10/27/2023)     No current facility-administered medications for this visit.  Review of Systems  Unable to perform ROS: Patient nonverbal     PHYSICAL EXAMINATION: ECOG PERFORMANCE STATUS: 3 - Symptomatic, >50% confined to bed  Vitals:   10/27/23 1250  BP: 111/67  Pulse: 63  Resp: 20  Temp: 98.6 F (37 C)  SpO2: 100%   There were no vitals filed for this visit.  Physical Exam Constitutional:      General: He is not in acute distress.    Appearance: He is not diaphoretic.  Eyes:      General: No scleral icterus. Cardiovascular:     Rate and Rhythm: Normal rate and regular rhythm.     Heart sounds: No murmur heard. Pulmonary:     Effort: Pulmonary effort is normal. No respiratory distress.     Breath sounds: No wheezing.     Comments: Severely decreased breath sound bilaterally Abdominal:     General: There is no distension.     Palpations: Abdomen is soft.     Tenderness: There is no abdominal tenderness.  Musculoskeletal:        General: Normal range of motion.     Cervical back: Normal range of motion and neck supple.     Comments: History of above-knee amputation  Skin:    General: Skin is warm and dry.     Findings: No erythema.  Neurological:     Mental Status: He is alert. Mental status is at baseline.     Motor: No abnormal muscle tone.     Comments: Orientated x 1 Right hemiplegia  Psychiatric:        Mood and Affect: Affect normal.      LABORATORY DATA:  I have reviewed the data as listed    Latest Ref Rng & Units 10/27/2023    1:34 PM 11/19/2022    8:33 AM 11/16/2022    4:46 AM  CBC  WBC 4.0 - 10.5 K/uL 5.8  9.9  14.2   Hemoglobin 13.0 - 17.0 g/dL 16.6  06.3  9.8   Hematocrit 39.0 - 52.0 % 35.8  32.2  30.9   Platelets 150 - 400 K/uL 368  520  571       Latest Ref Rng & Units 10/27/2023    1:34 PM 02/20/2023   10:21 AM 01/18/2023    7:29 AM  CMP  Glucose 70 - 99 mg/dL 73     BUN 8 - 23 mg/dL 12  18  12    Creatinine 0.61 - 1.24 mg/dL 0.16  0.10  9.32   Sodium 135 - 145 mmol/L 140     Potassium 3.5 - 5.1 mmol/L 3.4     Chloride 98 - 111 mmol/L 105     CO2 22 - 32 mmol/L 26     Calcium  8.9 - 10.3 mg/dL 8.7     Total Protein 6.5 - 8.1 g/dL 7.7     Total Bilirubin 0.0 - 1.2 mg/dL 0.4     Alkaline Phos 38 - 126 U/L 87     AST 15 - 41 U/L 15     ALT 0 - 44 U/L 15        RADIOGRAPHIC STUDIES: I have personally reviewed the radiological images as listed and agreed with the findings in the report. No results found.

## 2023-10-28 NOTE — Assessment & Plan Note (Signed)
 Check vitamin B12, folate, reticulocyte panel, CBC, CMP, protein electrophoresis, LDH, smear.

## 2023-10-30 LAB — PROTEIN ELECTROPHORESIS, SERUM
A/G Ratio: 0.8 (ref 0.7–1.7)
Albumin ELP: 3 g/dL (ref 2.9–4.4)
Alpha-1-Globulin: 0.3 g/dL (ref 0.0–0.4)
Alpha-2-Globulin: 1 g/dL (ref 0.4–1.0)
Beta Globulin: 1.2 g/dL (ref 0.7–1.3)
Gamma Globulin: 1.5 g/dL (ref 0.4–1.8)
Globulin, Total: 4 g/dL — ABNORMAL HIGH (ref 2.2–3.9)
Total Protein ELP: 7 g/dL (ref 6.0–8.5)

## 2023-11-09 ENCOUNTER — Ambulatory Visit

## 2023-11-10 ENCOUNTER — Ambulatory Visit

## 2023-11-16 ENCOUNTER — Ambulatory Visit

## 2023-11-27 NOTE — Progress Notes (Signed)
 HOSPITAL MEDICINE PROGRESS NOTE     Author: NICHOLE VERNELL PHLEGM, MD  Date of Service: 11/27/2023      Hospital Day: 10 with principal problem of Gangrene of foot (CMS/HHS-HCC)   SUBJECTIVE / 24 HOUR EVENTS   Seen and examined this evening.  Alert, states appetite is better.  Charlott Muskrat, daughter on 5-1 this evening.  Planned for transfer for [place] tomorrow at 12pm.  Muskrat is concerned about pt needing f/up CT chest for lung nodule -> discussed that preferably pt could have this as outpatient.  She is agreeable.  OBJECTIVE    SCHEDULED MEDICATIONS: acetaminophen , 975 mg, Oral, Q6H SCH allopurinoL , 300 mg, Oral, Daily apixaban , 5 mg, Oral, BID aspirin , 81 mg, Oral, Daily atorvastatin , 80 mg, Oral, Daily budesonide-formoteroL, 1 Puff, Inhalation, BID cyanocobalamin , 1,000 mcg, Oral, Daily ferrous sulfate, 325 mg, Oral, QMWF folic acid , 1 mg, Oral, Daily gabapentin, 100 mg, Oral, Daily melatonin, 3 mg, Oral, QHS metoprolol  SUCCinate, 12.5 mg, Oral, Daily multivitamin, 1 tablet, Oral, Daily pantoprazole , 40 mg, Oral, Daily thiamine , 100 mg, Oral, Daily traZODone , 25 mg, Oral, QHS    MEDICATION INFUSIONS:    PRN MEDICATIONS:  albuterol  MDI (PROVENTIL , VENTOLIN , PROAIR ) HFA, 2 Puff, Inhalation, Q4H PRN lidocaine , 0.5 mL, Subcutaneous, As Directed ondansetron  (PF), 4 mg, Intravenous, Q12H PRN oxyCODONE , 2.5-5 mg, Oral, Q4H PRN      Physical Exam:  Vital Signs:  Vitals:   11/27/23 1755 11/27/23 1925 11/27/23 2216 11/27/23 2239  BP: 118/57 113/59    BP Location:  Left upper arm    Patient Position:  Lying    Pulse:  (!) 117 105 98  Resp: 17 16 18    Temp: 36.8 C (98.2 F) 37.1 C (98.7 F)    TempSrc: Oral Oral    SpO2: 96% 93%    Weight:        Gen: Frail elderly man resting in bed, NAD  HEENT: sclerae anicteric, MMM  Cardio: +S1/S2, regular, no m/r/g  Resp: normal WOB, no w/r/r   GI: non distended  Ext: s/p R AKA (healed), L AKA site with dressing  in place GU: Primofit in place  Neuro: Alert and oriented to self and place (hospital).   Psych: calm, affect appropriate   Intake/Output I/O last 3 completed shifts: In: 220 [P.O.:220] Out: 651 [Urine:650; Stool:1]  Labs: I have reviewed the patient's labs.  Lab Results  Component Value Date   GLUCOSE 103 11/27/2023   NA 139 11/27/2023   K 4.0 11/27/2023   CO2 23 11/27/2023   CL 105 11/27/2023   BUN 24 (H) 11/27/2023   CREATININE 0.8 11/27/2023    Lab Results  Component Value Date   CALCIUM  9.2 11/27/2023   PHOS 3.9 11/27/2023   MG 1.9 11/27/2023     Lab Results  Component Value Date   WBC 8.1 11/27/2023   HGB 9.6 (L) 11/27/2023   HCT 31.1 (L) 11/27/2023   MCV 80 11/27/2023   PLT 426 11/27/2023    Lab Results  Component Value Date   ALT 15 11/19/2023   AST 15 11/19/2023   ALKPHOS 74 11/19/2023    Lab Results  Component Value Date   APTT 29.8 11/21/2023   INR 1.1 11/21/2023   Microbiology: None  Radiology: I have reviewed the patient's RAD and imaging studies.   CT abdomen angiogram with runoff with and without contrast Result Date: 11/19/2023 CT ABDOMEN ANGIOGRAM WITH RUNOFF WITH AND WITHOUT CONTRAST Indication:  Peripheral arterial disease (PAD), asymptomatic,  evaluate for vascular intervention, I96 Gangrene, not elsewhere classified (CMS/HHS-HCC) Comparison: CT abdomen pelvis 08/27/2023 Technique:  Volumetric 0.625 mm axial of the abdomen, pelvis, and lower extremities were performed following IV administration of iodinated contrast without adverse reaction. If IV contrast material had not been administered, the likelihood of detecting abnormalities relevant to the patients condition would have been substantially decreased. Likewise, the examination was interpreted using multiplanar and 3D reconstructions for assessment of vascular anatomy. VASCULAR: Aorta: Patent. Normal caliber. Severe atherosclerotic calcifications. Celiac Axis/SMA: Common origin of the  celiac trunk and SMA. Patent. Severe atherosclerotic calcifications at the origin with mild to moderate stenosis. Atherosclerotic calcifications within the distal SMA with moderate to severe stenosis. Renal Arteries: Patent. Severe atherosclerotic calcifications of the bilateral renal arteries with moderate to severe stenosis. Inferior Mesenteric Artery: Patent. Severe atherosclerotic calcifications at the origin with moderate to severe stenosis. Right lower extremity: Iliofemoral vessels: Patent common iliac artery to external iliac artery stent. Nonopacification of the right internal iliac artery. Severe atherosclerotic calcifications of the iliofemoral vessels. SFA: Occluded SFA stent without opacification. Profunda patent. Scattered atherosclerotic calcifications with multiple focal areas of mild-to-moderate stenosis. Above knee amputation. Left lower extremity: Iliofemoral vessels. Patent common iliac artery to external iliac artery stent. Nonopacification of the left internal iliac artery. Severe atherosclerotic calcifications of the iliofemoral vessels. SFA: SFA stents. The proximal stent is occluded. Nonopacification of the distal SFA. The distal stent within the distal SFA extends to the popliteal artery. The proximal end of the distal stent appears narrowed (14:485). Popliteal artery: Stent as above. Nonopacification of the stent. Tibioperoneal trunk: Nonopacification. Severe atherosclerotic calcifications. Anterior tibial artery: Limited evaluation contrast opacification due to severe atherosclerotic calcifications of multifocal areas of moderate to severe stenosis. The anterior tibial artery does not appear opacified. No evidence of distal reconstitution. Posterior tibial artery: There appears to be reconstitution with runoff to the foot. Scattered calcified atherosclerotic plaques with multifocal areas of moderate to severe stenosis. Peroneal artery: There appears to be reconstitution with opacification  to the foot. Scattered calcified atherosclerotic plaques with multifocal areas of moderate to severe stenosis. - Lower Thorax: No suspicious pulmonary abnormalities. No pleural or pericardial effusions. Dependent atelectasis. - Liver: Normal in morphology and enhancement.  Scattered hepatic cysts and too small to characterize hypodensities, similar prior. - Biliary and Gallbladder: No intrahepatic or extrahepatic bile duct dilatation. The gallbladder is normal in appearance. - Spleen: Normal in appearance.  - Pancreas: Normal in appearance. - Adrenal Glands: Normal in appearance. - Kidneys: Symmetric in size and enhancement. No suspicious renal lesions. No hydronephrosis. Bilateral renal cysts and too small to characterize hypodensities. - Abdominal and Pelvic Vasculature: Findings as above. - Gastrointestinal Tract: No abnormal dilation or wall thickening. - Peritoneum/Mesentery/Retroperitoneum: No free fluid.  No free intraperitoneal air. - Lymph Nodes: No retroperitoneal, mesenteric, pelvic, or inguinal lymphadenopathy.  - Bladder: Normal in appearance. - Pelvic Organs: Prostamegaly. - Body Wall: Postoperative changes from an right above knee amputation. Multiloculated fluid collection about the stump. Fluid components measure approximately 7.4 x 5.3 cm (6:107), and 5.6 x 3.6 centers (6:118). Soft tissue wound of the left great toe. No subcutaneous free air. - Musculoskeletal:  No aggressive appearing osseous lesions. IMPRESSION: 1.  Occluded left SFA stent. Nonopacification of the SFA down to the tibioperoneal trunk. 2.  Distal reconstitution of the posterior tibial and peroneal arteries with two-vessel runoff to the left foot. Of note, the anterior tibial artery is not opacified. 3.  The proximal end of the SFA/popliteal artery  stent appears narrowed. 4.  Right above knee amputation. Multiloculated fluid collections about the stump favored postoperative seromas. Developing infection is on the differential in  the appropriate clinical setting. 5.  Occluded right SFA stent. 6.  Soft tissue wound of the left great toe. No subcutaneous free air. Electronically Signed by:  Anil Nagavalli, DO, [facility] Radiology Electronically Signed on:  11/19/2023 9:22 AM  TTE 10/2022 ([facility], Care Everywhere)  1. Left ventricular ejection fraction, by estimation, is 50 to 55%. The left ventricle has low normal function. The left ventricle has no regional wall motion abnormalities. Left ventricular diastolic parameters are consistent with Grade I diastolic dysfunction (impaired relaxation). The average left ventricular global longitudinal strain is -8.9 %.   2. Right ventricular systolic function is normal. The right ventricular size is normal.   3. The mitral valve is normal in structure. Mild mitral valve regurgitation. No evidence of mitral stenosis.   4. The aortic valve is tricuspid. Aortic valve regurgitation is not visualized. Aortic valve sclerosis is present, with no evidence of aortic valve stenosis.   5. There is borderline dilatation of the aortic root, measuring 37 mm.   6. The inferior vena cava is normal in size with greater than 50% respiratory variability, suggesting right atrial pressure of 3 mmHg.   MRI brain 10/2022 IMPRESSION:  1. Punctate subacute infarct in the right cerebellar hemisphere. No  acute hemorrhage or significant mass effect.  2. Loss of the left ICA flow void, favored chronic in the setting of  old left ACA-MCA border zone infarct.   Problem list: Principal Problem:   Gangrene of foot (CMS/HHS-HCC) Active Problems:   Encephalopathy   S/P AKA (above knee amputation) unilateral, right (CMS/HHS-HCC)   Anemia   Dementia (CMS/HHS-HCC)   Gangrene of toe of left foot (CMS/HHS-HCC) Resolved Problems:   * No resolved hospital problems. *  Assessment: 68 y.o. 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  S/p tx with hep gtt, IV abx and now s/p L AKA with Dr. Viktoria on 11/21/2023.    Plan:  L toe gangrene, now s/p L AKA PAD with h/o R AKA 08/2023  -p/w progression of L great toe wound, with toenail falling off and recent purulent-sanguinous drainage. Did not meet sepsis criteria on admission.  CTA showed Occluded left SFA stent. Nonopacification of the SFA down to the tibioperoneal trunk, distal reconstitution of the posterior tibial and peroneal arteries with two-vessel runoff to the left foot. Of note, the anterior tibial artery is not opacified, 2/2 occluded right SFA stent.  - S/p palliative R AKA by Dr. Viktoria on 11/21/2023 - PT/OT ordered, NWB RLE - recommending SNF.  Awaiting insurance auth - have accepted bed offer at [facility].  - Started on vanc/zosyn on admission, which was continued through 48h post operatively.   - Pain control with oxycodone  2.5-5 mg q4 PRN and IV dilaudid  0.25mg  q6 PRN  - Anticipate d/c to SNF on 7/8 - Has f/up appt on 7/23 and referral to limb loss clinic placed as well   Tachycardia HR 100s-110s on 7/4 in sinus with PVCs.  No chest pain  but did have diaphoresis.  EKG without ischemic changes.  Ruled out by hsTnIs - stable at 23.  PE seems less likely since pt received lovenox  POD #1 and then eliquis  at ppx dose POD #2 and 3, now resumed home dose of 5mg  BID. - CTM   Encephalopathy - resolved Vascular dementia Confusion noted on initial admission, likely multifactorial metabolic encephalopathy due to gangrene, compounded by known dementia, stroke.  Mental status subsequently improved, oriented to self and family, stable.MRI brain from 10/2022 at [facility] shows punctate subacute infarct R cerebellar hemisphere and old left  ACA-MCA border zone infarct. - no new focal deficits on exam - Delirium precautions, avoid sedating medications. - aspiration precautions  H/o recurrent LE DVTs H/o L femoral and popliteal DVT 10/2022, non occlusive thrombus R common femoral vein 08/2020. - On eliquis  5mg  BID prior to surgery - will resume at 2.5mg  BID on 7/2 or 7/3 pending stable Hb/Hct   Chronic iron deficiency anemia Hb 10.5-11 on admission, with subsequent downtrend to 8.3 - CTM  -Continue PO Iron -Goal Hb > 8 g/dL due to h/o CAD  H/o CVA with residual right hem Bilateral ICA stenoses, R CCA stent with occlusion CAD HTN HLD -continue ASA, Atorvastatin , Metoprolol  succinate   COPD -Continue home inhalers  Gout  -continue home allopurinol  300 mg daily   Right lung nodule  - 1 cm nodule in RUL noted on CT 08/2023 and follow up CT was recommended after 3 months ( which is in a week) - Recommend outpatient follow up - will order CT in ~1 month    GERD -continue PPI   GOC Currently s/p 2 AKAs and has advanced dementia with multiple additional co-morbidities.  Will contact family 7/2 and discuss further goals.  No ACP on file for patient.    VTE Prophylaxis: On hold for OR   GI Prophylaxis: Pantoprazole    Nutrition: Diet regular Oral Supplements - Adult All Supplements; Ensure Plus High Protein-Vanilla; With Meals Oral Supplements - Adult All Supplements; Juven-Assorted; Breakfast and Dinner    CODE STATUS: Full Code  Discharge planning:  Functional Status prior to admission: Required assistance             Change from baseline: yes  Cognitive status prior to admission: ORT H/o memory loss, cognitive dysfunction, dementia: yes             Change from baseline: yes  PT/OT consult needed (if change in baseline status and if recovery to baseline not anticipated after resolution of acute symptoms) :unknown  Home/ family situation:Patient lives with daughter.   Home support after discharge: Unchanged  from prior to admission:   Predicted discharge location: To be determined    Estimated discharge date (enter EDD in admission navigator if blank here):   11/28/2023  MISSY DUX 11/27/2023 10:49 PM  Other barriers for discharge: Clinical condition  Signature: DUX VERNELL MISSY, MD 11/27/2023 10:50 PM Electronic Signature

## 2023-11-28 NOTE — Care Plan (Signed)
 Problem: Safety Goal: Free from accidental physical injury Outcome: Progressing   Problem: Daily Care Goal: Daily care needs are met Description: Assess and monitor ability to perform self care and identify potential discharge needs. Outcome: Progressing   Problem: Pain Goal: Patient's pain/discomfort is manageable Description: Assess and monitor patient's pain using appropriate pain scale. Collaborate with interdisciplinary team and initiate plan and interventions as ordered. Re-assess patient's pain level 30 - 60 minutes after pain management intervention.  Outcome: Progressing   Problem: Compromised Skin Integrity Goal: Skin integrity is maintained or improved Description: Assess and monitor skin integrity. Identify patients at risk for skin breakdown on admission and per policy. Collaborate with interdisciplinary team and initiate plans and interventions as needed. Outcome: Progressing Goal: Fluid and electrolyte balance are achieved/maintained Description: Assess and monitor vital signs (orthostatic vitals if applicable), fluid intake and output, urine color, labs, skin turgor, mucous membranes, jugular venous distention, edema, circumference of edematous extremities and abdominal girth, respiratory status, and mental status.  Monitor for signs and symptoms of hypovolemia (tachycardia, rapid breathing, decreased urine output, postural hypotension, confusion, syncope).  Monitor for signs and symptoms of hypervolemia (strong rapid pulse, shortness of breath, difficulty breathing lying down, crackles heard in lung fields, edema). Collaborate with interdisciplinary team and initiate plan and interventions as ordered. Outcome: Progressing Goal: Nutritional status is improving Description: Monitor and assess patient for malnutrition (ex- brittle hair, bruises, dry skin, pale skin and conjunctiva, muscle wasting, smooth red tongue, and disorientation). Collaborate with interdisciplinary team  and initiate plan and interventions as ordered.  Monitor patient's weight and dietary intake as ordered or per policy. Utilize nutrition screening tool and intervene per policy. Determine patient's food preferences and provide high-protein, high-caloric foods as appropriate.  Outcome: Progressing   Problem: Knowledge Deficit Goal: Patient/family/caregiver demonstrates understanding of disease process, treatment plan, medications, and discharge instructions Description: Complete learning assessment and assess knowledge base. Outcome: Progressing   Problem: Discharge Barriers Goal: Patient's discharge needs are met Description: Collaborate with interdisciplinary team and initiate plans and interventions as needed.  Outcome: Progressing   Problem: Risk for Infection: Goal: Will remain free from infection Outcome: Progressing   Problem: Potential for hemodynamic instability: Goal: Ability to maintain vital signs within normal range will stabilize Outcome: Progressing   Problem: Skin Integrity Impairment for Adult populations Goal: Sensory Perception Description: All Adult patients at medium/high risk based on Braden risk assessment subscale will not develop a pressure injury. Implement prevention interventions based on risk assessment sub-scale scores: Outcome: Progressing Goal: Moisture Description: All Adult patients at medium/high risk based on Braden risk assessment subscale will not develop a pressure injury. Implement prevention interventions based on risk assessment sub-scale scores: Outcome: Progressing Goal: Activity Description: All Adult patients at medium/high risk based on Braden risk assessment subscale will not develop a pressure injury. Implement prevention interventions based on risk assessment sub-scale scores: Outcome: Progressing Goal: Mobility Description: All Adult patients at medium/high risk based on Braden risk assessment subscale will not develop a pressure  injury. Implement prevention interventions based on risk assessment sub-scale scores: Outcome: Progressing Goal: Friction & Shear Description: All Adult patients at medium/high risk based on Braden risk assessment subscale will not develop a pressure injury. Implement prevention interventions based on risk assessment sub-scale scores: Outcome: Progressing   Problem: Patients that are at an increased risk for delirium. Goal: Sleep, rest, and comfort: The patient will be comfortable and rested. Outcome: Progressing Goal: Hydration and nutrition: The patient will be hydrated and nourished. Outcome: Progressing  Problem: All patients positive for delirium Goal: Patient will remain safe Outcome: Progressing   Problem: Activity/physical mobility impairment Goal: Patient will maintain AKA/BKA limb precautions Outcome: Progressing

## 2023-12-04 ENCOUNTER — Telehealth: Payer: Self-pay | Admitting: Oncology

## 2023-12-04 NOTE — Telephone Encounter (Signed)
 Spoke w/pt daughter just now. She said they want to hold off for now because he is in rehab for an unknown amount of time. I told her to follow-up with us  if that is something they are still interested in after rehab. - LH
